# Patient Record
Sex: Female | Born: 1960 | Race: Black or African American | Hispanic: No | Marital: Single | Smoking: Never smoker
Health system: Southern US, Community
[De-identification: ages and names within clinical notes are randomized; demographics above are authoritative.]

## PROBLEM LIST (undated history)

## (undated) DIAGNOSIS — E119 Type 2 diabetes mellitus without complications: Secondary | ICD-10-CM

## (undated) DIAGNOSIS — G2581 Restless legs syndrome: Secondary | ICD-10-CM

## (undated) DIAGNOSIS — I2510 Atherosclerotic heart disease of native coronary artery without angina pectoris: Secondary | ICD-10-CM

## (undated) DIAGNOSIS — F329 Major depressive disorder, single episode, unspecified: Secondary | ICD-10-CM

## (undated) DIAGNOSIS — I639 Cerebral infarction, unspecified: Secondary | ICD-10-CM

## (undated) DIAGNOSIS — I10 Essential (primary) hypertension: Secondary | ICD-10-CM

## (undated) DIAGNOSIS — F419 Anxiety disorder, unspecified: Secondary | ICD-10-CM

## (undated) DIAGNOSIS — F32A Depression, unspecified: Secondary | ICD-10-CM

## (undated) DIAGNOSIS — K5909 Other constipation: Secondary | ICD-10-CM

## (undated) DIAGNOSIS — J45909 Unspecified asthma, uncomplicated: Secondary | ICD-10-CM

## (undated) HISTORY — PX: ABDOMINAL HYSTERECTOMY: SHX81

## (undated) HISTORY — PX: CAROTID ENDARTERECTOMY: SUR193

## (undated) HISTORY — DX: Unspecified asthma, uncomplicated: J45.909

## (undated) MED ORDER — DULOXETINE HCL 30 MG OR CPEP
ORAL_CAPSULE | ORAL | 0 refills | Status: AC
Start: 2018-10-24 — End: 2018-12-23

## (undated) MED ORDER — NICOTINE 21 MG/24HR TD PT24
MEDICATED_PATCH | TRANSDERMAL | 0 refills | Status: AC
Start: 2018-05-25 — End: ?

## (undated) MED ORDER — LISINOPRIL 40 MG OR TABS
ORAL_TABLET | ORAL | 0 refills | Status: AC
Start: 2020-03-19 — End: ?

## (undated) MED ORDER — QUETIAPINE FUMARATE 200 MG OR TABS
ORAL_TABLET | ORAL | 0 refills | Status: AC
Start: 2018-11-14 — End: ?

## (undated) MED ORDER — NITROGLYCERIN 0.4 MG SL SUBL
SUBLINGUAL_TABLET | SUBLINGUAL | Status: AC
Start: 2018-07-27 — End: ?

## (undated) MED ORDER — QUETIAPINE FUMARATE 200 MG OR TABS
ORAL_TABLET | ORAL | 0 refills | Status: AC
Start: 2018-11-21 — End: ?

## (undated) MED ORDER — POTASSIUM CHLORIDE CR 10 MEQ OR TBCR
10.00 meq | EXTENDED_RELEASE_TABLET | Freq: Every day | ORAL | 3 refills | Status: AC
Start: 2018-09-18 — End: ?

---

## 1991-10-05 HISTORY — PX: TOTAL ABDOMINAL HYSTERECTOMY W/ BILATERAL SALPINGOOPHORECTOMY: SHX83

## 1997-02-21 ENCOUNTER — Emergency Department: Admission: EM | Admit: 1997-02-21 | Payer: Self-pay

## 1997-05-15 ENCOUNTER — Emergency Department: Admission: EM | Admit: 1997-05-15 | Payer: Self-pay

## 1997-05-16 ENCOUNTER — Emergency Department: Admission: EM | Admit: 1997-05-16 | Payer: Self-pay

## 1997-05-17 ENCOUNTER — Inpatient Hospital Stay: Admission: TF | Admit: 1997-05-17 | Payer: Self-pay

## 2001-10-04 DIAGNOSIS — G2581 Restless legs syndrome: Secondary | ICD-10-CM

## 2001-10-04 DIAGNOSIS — F319 Bipolar disorder, unspecified: Secondary | ICD-10-CM

## 2001-10-04 DIAGNOSIS — F209 Schizophrenia, unspecified: Secondary | ICD-10-CM

## 2001-10-04 DIAGNOSIS — F251 Schizoaffective disorder, depressive type: Secondary | ICD-10-CM | POA: Insufficient documentation

## 2001-10-04 DIAGNOSIS — F32A Depression, unspecified: Secondary | ICD-10-CM

## 2001-10-04 DIAGNOSIS — F419 Anxiety disorder, unspecified: Secondary | ICD-10-CM

## 2001-10-04 HISTORY — DX: Depression, unspecified: F32.A

## 2001-10-04 HISTORY — DX: Restless legs syndrome: G25.81

## 2001-10-04 HISTORY — DX: Anxiety disorder, unspecified: F41.9

## 2001-10-04 HISTORY — DX: Bipolar disorder, unspecified (CMS-HCC): F31.9

## 2001-10-04 HISTORY — DX: Schizophrenia, unspecified (CMS-HCC): F20.9

## 2016-11-20 DIAGNOSIS — Z8673 Personal history of transient ischemic attack (TIA), and cerebral infarction without residual deficits: Secondary | ICD-10-CM | POA: Insufficient documentation

## 2016-11-20 DIAGNOSIS — I639 Cerebral infarction, unspecified: Secondary | ICD-10-CM

## 2016-11-20 HISTORY — DX: Cerebral infarction, unspecified (CMS-HCC): I63.9

## 2016-12-02 HISTORY — PX: THROMBOENDARTERECTOMY: SHX46

## 2017-02-08 ENCOUNTER — Emergency Department (HOSPITAL_COMMUNITY): Payer: Self-pay

## 2017-02-08 ENCOUNTER — Emergency Department (HOSPITAL_COMMUNITY)
Admission: EM | Admit: 2017-02-08 | Discharge: 2017-02-08 | Disposition: A | Discharge status: Home / Self Care | Payer: Self-pay | Attending: Emergency Medicine | Admitting: Emergency Medicine

## 2017-02-08 ENCOUNTER — Encounter (HOSPITAL_COMMUNITY): Payer: Self-pay | Admitting: Emergency Medicine

## 2017-02-08 DIAGNOSIS — I2510 Atherosclerotic heart disease of native coronary artery without angina pectoris: Secondary | ICD-10-CM | POA: Insufficient documentation

## 2017-02-08 DIAGNOSIS — E119 Type 2 diabetes mellitus without complications: Secondary | ICD-10-CM | POA: Insufficient documentation

## 2017-02-08 DIAGNOSIS — R52 Pain, unspecified: Secondary | ICD-10-CM | POA: Insufficient documentation

## 2017-02-08 DIAGNOSIS — Z8673 Personal history of transient ischemic attack (TIA), and cerebral infarction without residual deficits: Secondary | ICD-10-CM | POA: Insufficient documentation

## 2017-02-08 DIAGNOSIS — Z7984 Long term (current) use of oral hypoglycemic drugs: Secondary | ICD-10-CM | POA: Insufficient documentation

## 2017-02-08 DIAGNOSIS — Z79899 Other long term (current) drug therapy: Secondary | ICD-10-CM | POA: Insufficient documentation

## 2017-02-08 DIAGNOSIS — I10 Essential (primary) hypertension: Secondary | ICD-10-CM | POA: Insufficient documentation

## 2017-02-08 DIAGNOSIS — B349 Viral infection, unspecified: Secondary | ICD-10-CM | POA: Insufficient documentation

## 2017-02-08 HISTORY — DX: Cerebral infarction, unspecified: I63.9

## 2017-02-08 HISTORY — DX: Other constipation: K59.09

## 2017-02-08 HISTORY — DX: Major depressive disorder, single episode, unspecified: F32.9

## 2017-02-08 HISTORY — DX: Atherosclerotic heart disease of native coronary artery without angina pectoris: I25.10

## 2017-02-08 HISTORY — DX: Restless legs syndrome: G25.81

## 2017-02-08 HISTORY — DX: Depression, unspecified: F32.A

## 2017-02-08 HISTORY — DX: Essential (primary) hypertension: I10

## 2017-02-08 HISTORY — DX: Anxiety disorder, unspecified: F41.9

## 2017-02-08 HISTORY — DX: Type 2 diabetes mellitus without complications: E11.9

## 2017-02-08 LAB — URINALYSIS, ROUTINE W REFLEX MICROSCOPIC
Bilirubin Urine: NEGATIVE
GLUCOSE, UA: NEGATIVE mg/dL
Hgb urine dipstick: NEGATIVE
KETONES UR: NEGATIVE mg/dL
LEUKOCYTES UA: NEGATIVE
NITRITE: NEGATIVE
PROTEIN: NEGATIVE mg/dL
Specific Gravity, Urine: 1.012 (ref 1.005–1.030)
pH: 5 (ref 5.0–8.0)

## 2017-02-08 LAB — CBC WITH DIFFERENTIAL/PLATELET
BASOS ABS: 0 10*3/uL (ref 0.0–0.1)
Basophils Relative: 0 %
EOS ABS: 0.2 10*3/uL (ref 0.0–0.7)
Eosinophils Relative: 1 %
HCT: 35.3 % — ABNORMAL LOW (ref 36.0–46.0)
Hemoglobin: 12 g/dL (ref 12.0–15.0)
LYMPHS ABS: 4.6 10*3/uL — AB (ref 0.7–4.0)
Lymphocytes Relative: 28 %
MCH: 30.4 pg (ref 26.0–34.0)
MCHC: 34 g/dL (ref 30.0–36.0)
MCV: 89.4 fL (ref 78.0–100.0)
MONO ABS: 1.5 10*3/uL — AB (ref 0.1–1.0)
Monocytes Relative: 9 %
Neutro Abs: 10.1 10*3/uL — ABNORMAL HIGH (ref 1.7–7.7)
Neutrophils Relative %: 62 %
PLATELETS: 227 10*3/uL (ref 150–400)
RBC: 3.95 MIL/uL (ref 3.87–5.11)
RDW: 14.6 % (ref 11.5–15.5)
WBC: 16.4 10*3/uL — AB (ref 4.0–10.5)

## 2017-02-08 LAB — COMPREHENSIVE METABOLIC PANEL
ALT: 28 U/L (ref 14–54)
AST: 32 U/L (ref 15–41)
Albumin: 4.1 g/dL (ref 3.5–5.0)
Alkaline Phosphatase: 48 U/L (ref 38–126)
Anion gap: 10 (ref 5–15)
BILIRUBIN TOTAL: 0.6 mg/dL (ref 0.3–1.2)
BUN: 11 mg/dL (ref 6–20)
CO2: 23 mmol/L (ref 22–32)
CREATININE: 0.85 mg/dL (ref 0.44–1.00)
Calcium: 9.4 mg/dL (ref 8.9–10.3)
Chloride: 105 mmol/L (ref 101–111)
GFR calc Af Amer: >60 mL/min (ref >60)
Glucose, Bld: 95 mg/dL (ref 65–99)
Potassium: 3.9 mmol/L (ref 3.5–5.1)
Sodium: 138 mmol/L (ref 135–145)
TOTAL PROTEIN: 7.5 g/dL (ref 6.5–8.1)

## 2017-02-08 LAB — I-STAT TROPONIN, ED
TROPONIN I, POC: 0 ng/mL (ref 0.00–0.08)
Troponin i, poc: 0.01 ng/mL (ref 0.00–0.08)

## 2017-02-08 LAB — I-STAT CG4 LACTIC ACID, ED: Lactic Acid, Venous: 1.6 mmol/L (ref 0.5–1.9)

## 2017-02-08 NOTE — ED Triage Notes (Signed)
Pt c/o neck stiff-- has had recent surgery on right carotid in [place]--- recently moved here last week-- states that she is supposed to have surgery on left side- but no physician locally.  Pt c/o woke up not feeling good, arms hurt, aches all over- did receive flu shot--

## 2017-02-08 NOTE — ED Notes (Signed)
Phlebotomy unsuccessful

## 2017-02-08 NOTE — Discharge Instructions (Signed)
Take ibuprofen 600 mg and Tylenol 650 mg every 8 hours for 2 days

## 2017-02-08 NOTE — ED Notes (Signed)
Pt came to the desk and reported left sided chest pain that started while waiting.

## 2017-02-08 NOTE — ED Notes (Signed)
ED Provider at bedside. 

## 2017-02-08 NOTE — ED Provider Notes (Signed)
[facility]ENCY DEPT Provider Note   CSN: [id number] Arrival date & time: 02/08/17  1309     History   Chief Complaint Chief Complaint  Patient presents with  . Generalized Body Aches  . Fever    HPI Amy Cox is a 56 y.o. female.  The history is provided by the patient and a relative.  Fever   This is a new problem. Episode onset: today. The problem occurs constantly. The problem has been gradually improving. Her temperature was unmeasured prior to arrival. Associated symptoms include muscle aches (centered on back, shoulders, and neck.). Pertinent negatives include no chest pain, no diarrhea, no vomiting, no congestion, no headaches and no cough. She has tried nothing for the symptoms. The treatment provided no relief.    Past Medical History:  Diagnosis Date  . Anxiety   . Chronic constipation   . Coronary artery disease   . Depression   . Diabetes mellitus without complication (HCC)   . Hypertension   . Restless legs syndrome (RLS)   . Stroke [facility](HCC)     There are no active problems to display for this patient.   Past Surgical History:  Procedure Laterality Date  . ABDOMINAL HYSTERECTOMY    . CAROTID ENDARTERECTOMY Right     OB History    No data available       Home Medications    Prior to Admission medications   Medication Sig Start Date End Date Taking? Authorizing Provider  acetaminophen (TYLENOL) 325 MG tablet Take 650 mg by mouth every 6 (six) hours as needed for mild pain.   Yes [provider]  atorvastatin (LIPITOR) 80 MG tablet Take 80 mg by mouth at bedtime.   Yes [provider]  bisacodyl (DULCOLAX) 10 MG suppository Place 10 mg rectally as needed for moderate constipation.   Yes [provider]  bisacodyl (DULCOLAX) 5 MG EC tablet Take 10 mg by mouth every evening.   Yes [provider]  calcium carbonate (TUMS - DOSED IN MG ELEMENTAL CALCIUM) 500 MG chewable tablet Chew 1-2 tablets by mouth every 4 (four)  hours as needed for indigestion or heartburn.   Yes [provider]  clopidogrel (PLAVIX) 75 MG tablet Take 75 mg by mouth daily.   Yes [provider]  furosemide (LASIX) 20 MG tablet Take 20 mg by mouth every other day.   Yes [provider]  HYDROcodone-acetaminophen (NORCO/VICODIN) 5-325 MG tablet Take 1 tablet by mouth at bedtime as needed for moderate pain.   Yes [provider]  metFORMIN (GLUMETZA) 1000 MG (MOD) 24 hr tablet Take 1,000 mg by mouth daily with breakfast.   Yes [provider]  potassium chloride (K-DUR,KLOR-CON) 10 MEQ tablet Take 10 mEq by mouth every morning.   Yes [provider]  rOPINIRole (REQUIP) 3 MG tablet Take 3 mg by mouth at bedtime.   Yes [provider]  traZODone (DESYREL) 50 MG tablet Take 25 mg by mouth at bedtime.   Yes [provider]    Family History No family history on file.  Social History Social History  Substance Use Topics  . Smoking status: Never Smoker  . Smokeless tobacco: Never Used  . Alcohol use No     Allergies   Patient has no allergy information on record.   Review of Systems Review of Systems  Constitutional: Positive for fever.  HENT: Negative for congestion and trouble swallowing.   Eyes: Negative.   Respiratory: Negative for cough and shortness  of breath.   Cardiovascular: Negative for chest pain and leg swelling.  Gastrointestinal: Negative for diarrhea and vomiting.  Genitourinary: Negative.   Neurological: Negative for headaches.  All other systems reviewed and are negative.    Physical Exam Updated Vital Signs BP 122/71 (BP Location: Right Arm)   Pulse 94   Temp 100.2 F (37.9 C) (Oral)   Resp (!) 22   SpO2 97%   Physical Exam  Constitutional: She is oriented to person, place, and time. She appears well-developed and well-nourished. No distress.  HENT:  Head: Normocephalic and atraumatic.  Mouth/Throat: Oropharynx is clear and  moist.  Eyes: EOM are normal. Pupils are equal, round, and reactive to light.  Neck: Normal range of motion. Neck supple. No tracheal deviation present.  Well healed surgical scar along right neck. No swelling, induration, or erythema. No LAD  Cardiovascular: Normal rate, regular rhythm, normal heart sounds and intact distal pulses.   No murmur heard. Pulmonary/Chest: Effort normal and breath sounds normal. No respiratory distress.  Abdominal: Soft. She exhibits no distension. There is no tenderness. There is no guarding.  Musculoskeletal: Normal range of motion. She exhibits no edema or tenderness.  Neurological: She is alert and oriented to person, place, and time. No cranial nerve deficit. She exhibits normal muscle tone. Coordination normal.  Baseline decreased strength in left upper ext  Skin: Skin is warm and dry. She is not diaphoretic. No erythema.  Psychiatric: She has a normal mood and affect.  Nursing note and vitals reviewed.    ED Treatments / Results  Labs (all labs ordered are listed, but only abnormal results are displayed) Labs Reviewed  CBC WITH DIFFERENTIAL/PLATELET - Abnormal; Notable for the following:       Result Value   WBC 16.4 (*)    HCT 35.3 (*)    Neutro Abs 10.1 (*)    Lymphs Abs 4.6 (*)    Monocytes Absolute 1.5 (*)    All other components within normal limits  COMPREHENSIVE METABOLIC PANEL  URINALYSIS, ROUTINE W REFLEX MICROSCOPIC  I-STAT CG4 LACTIC ACID, ED  Rosezena Sensor, ED  Rosezena Sensor, ED    EKG  EKG Interpretation  Date/Time:  Tuesday Feb 08 2017 16:49:06 EDT Ventricular Rate:  79 PR Interval:  120 QRS Duration: 80 QT Interval:  389 QTC Calculation: 446 R Axis:   36 Text Interpretation:  Sinus rhythm Nonspecific T abnormalities, anterior leads No significant change since last tracing Confirmed by KNAPP  MD-J, JON ([id number]) on 02/08/2017 5:00:37 PM       Radiology Dg Chest 2 View  Result Date: 02/08/2017 CLINICAL DATA:  Neck  stiffness.  Recent carotid surgery. EXAM: CHEST  2 VIEW COMPARISON:  None. FINDINGS: Cardiomegaly. No focal infiltrates or significant atelectasis. Low lung volumes accentuate the pulmonary markings, and there could be mild vascular congestion without frank edema. No effusion or pneumothorax. Degenerative change thoracic spine. IMPRESSION: Cardiomegaly. Query mild vascular congestion without frank edema. No active disease. Electronically Signed   By: Elsie Stain M.D.   On: 02/08/2017 15:32    Procedures Procedures (including critical care time)  Medications Ordered in ED Medications - No data to display   Initial Impression / Assessment and Plan / ED Course  I have reviewed the triage vital signs and the nursing notes.  Pertinent labs & imaging results that were available during my care of the patient were reviewed by me and considered in my medical decision making (see chart for details).  Patient is a 55 year old female with a recent history of stroke in February with resultant left sided defecits s/p right-sided endarterectomy in April who presents with body aches as well as chills and fever that began today. No fever at home but febrile here. Otherwise hemodynamically stable. She has not had a left endarterectomy yet which is supposed to happen in the near future but she recently moved here from [place] and has not set up health care providers. She is otherwise well appearing without signs of a surgical site infection and has neck full range of motion and is awake alert oriented. While she was here she also complained of some vague left-sided chest pain. EKG with nonspecific T-wave changes but otherwise no acute ischemic changes and unchanged from multiple EKGs here. Delta troponin negative. At this time doubt ACS. No new neuro findings concerning for stroke. Concern for viral syndrome. Not septic at this time.   I have reviewed all labs and iamging. Patient stable for discharge home.  I  have reviewed all results with the patient. Advised to f/u with a pcp within 5 days for further evaluation and for coordination of future procedures and care.. Patient agrees to stated plan. All questions answered. Advised to call or return to have any questions, new symptoms, change in symptoms, or symptoms that they do not understand.   Final Clinical Impressions(s) / ED Diagnoses   Final diagnoses:  Viral syndrome  Body aches    New Prescriptions Discharge Medication List as of 02/08/2017  8:41 PM       Marijean Niemann, MD 02/09/17 1610    Linwood Dibbles, MD 02/09/17 1625

## 2017-02-08 NOTE — ED Notes (Signed)
Phlebotomy at the bedside  

## 2017-07-01 ENCOUNTER — Encounter: Payer: Self-pay | Admitting: Student in an Organized Health Care Education/Training Program

## 2017-07-01 ENCOUNTER — Ambulatory Visit
Payer: No Typology Code available for payment source | Attending: Family Practice | Admitting: Student in an Organized Health Care Education/Training Program

## 2017-07-01 VITALS — BP 131/84 | HR 70 | Temp 98.5°F | Resp 17 | Ht 62.0 in | Wt 223.7 lb

## 2017-07-01 DIAGNOSIS — I6523 Occlusion and stenosis of bilateral carotid arteries: Secondary | ICD-10-CM | POA: Insufficient documentation

## 2017-07-01 DIAGNOSIS — Z0000 Encounter for general adult medical examination without abnormal findings: Secondary | ICD-10-CM | POA: Insufficient documentation

## 2017-07-01 DIAGNOSIS — F319 Bipolar disorder, unspecified: Secondary | ICD-10-CM | POA: Insufficient documentation

## 2017-07-01 DIAGNOSIS — R0789 Other chest pain: Secondary | ICD-10-CM | POA: Insufficient documentation

## 2017-07-01 DIAGNOSIS — I63233 Cerebral infarction due to unspecified occlusion or stenosis of bilateral carotid arteries: Secondary | ICD-10-CM | POA: Insufficient documentation

## 2017-07-01 DIAGNOSIS — F99 Mental disorder, not otherwise specified: Secondary | ICD-10-CM | POA: Insufficient documentation

## 2017-07-01 DIAGNOSIS — J449 Chronic obstructive pulmonary disease, unspecified: Secondary | ICD-10-CM | POA: Insufficient documentation

## 2017-07-01 DIAGNOSIS — Z1211 Encounter for screening for malignant neoplasm of colon: Secondary | ICD-10-CM | POA: Insufficient documentation

## 2017-07-01 DIAGNOSIS — F209 Schizophrenia, unspecified: Secondary | ICD-10-CM | POA: Insufficient documentation

## 2017-07-01 DIAGNOSIS — F17200 Nicotine dependence, unspecified, uncomplicated: Secondary | ICD-10-CM | POA: Insufficient documentation

## 2017-07-01 DIAGNOSIS — F5105 Insomnia due to other mental disorder: Secondary | ICD-10-CM | POA: Insufficient documentation

## 2017-07-01 DIAGNOSIS — G2581 Restless legs syndrome: Secondary | ICD-10-CM | POA: Insufficient documentation

## 2017-07-01 MED ORDER — CLOPIDOGREL BISULFATE 75 MG OR TABS
75.00 mg | ORAL_TABLET | Freq: Every day | ORAL | 0 refills | Status: DC
Start: 2017-06-13 — End: 2017-07-01

## 2017-07-01 MED ORDER — FUROSEMIDE 20 MG OR TABS
20.0000 mg | ORAL_TABLET | Freq: Every day | ORAL | 3 refills | Status: DC
Start: 2017-07-01 — End: 2018-03-07

## 2017-07-01 MED ORDER — ASPIRIN 81 MG OR TBEC
81.0000 mg | DELAYED_RELEASE_TABLET | Freq: Every day | ORAL | 3 refills | Status: DC
Start: 2017-07-01 — End: 2018-03-07

## 2017-07-01 MED ORDER — ASPIRIN 81 MG OR TBEC
81.00 mg | DELAYED_RELEASE_TABLET | Freq: Every day | ORAL | Status: DC
Start: ? — End: 2017-07-01

## 2017-07-01 MED ORDER — TRAZODONE HCL 100 MG OR TABS
100.0000 mg | ORAL_TABLET | Freq: Every evening | ORAL | 0 refills | Status: DC
Start: 2017-07-01 — End: 2017-08-04

## 2017-07-01 MED ORDER — FUROSEMIDE 20 MG OR TABS
20.00 mg | ORAL_TABLET | ORAL | 0 refills | Status: DC
Start: 2017-06-13 — End: 2017-07-01

## 2017-07-01 MED ORDER — ATORVASTATIN CALCIUM 80 MG OR TABS
80.0000 mg | ORAL_TABLET | Freq: Every evening | ORAL | 3 refills | Status: DC
Start: 2017-07-01 — End: 2018-03-07

## 2017-07-01 MED ORDER — TRAZODONE HCL 100 MG OR TABS
100.00 mg | ORAL_TABLET | Freq: Every evening | ORAL | 0 refills | Status: DC
Start: 2017-06-13 — End: 2017-07-01

## 2017-07-01 MED ORDER — ATORVASTATIN CALCIUM 80 MG OR TABS
80.00 mg | ORAL_TABLET | Freq: Every evening | ORAL | 0 refills | Status: DC
Start: 2017-06-13 — End: 2017-07-01

## 2017-07-01 MED ORDER — POTASSIUM CHLORIDE CR 10 MEQ OR TBCR
10.00 meq | EXTENDED_RELEASE_TABLET | Freq: Every day | ORAL | 0 refills | Status: DC
Start: 2017-06-13 — End: 2017-07-01

## 2017-07-01 MED ORDER — OLANZAPINE 7.5 MG OR TABS
7.50 mg | ORAL_TABLET | Freq: Every day | ORAL | 0 refills | Status: DC
Start: 2017-06-13 — End: 2017-07-01

## 2017-07-01 MED ORDER — OLANZAPINE 7.5 MG OR TABS
7.5000 mg | ORAL_TABLET | Freq: Every day | ORAL | 3 refills | Status: DC
Start: 2017-07-01 — End: 2017-08-04

## 2017-07-01 MED ORDER — ROPINIROLE HCL 4 MG OR TABS
ORAL_TABLET | ORAL | 0 refills | Status: DC
Start: 2017-06-14 — End: 2017-07-01

## 2017-07-01 MED ORDER — ROPINIROLE HCL 4 MG OR TABS
4.0000 mg | ORAL_TABLET | Freq: Every day | ORAL | 3 refills | Status: DC
Start: 2017-07-01 — End: 2017-08-04

## 2017-07-01 MED ORDER — POTASSIUM CHLORIDE CR 10 MEQ OR TBCR
10.0000 meq | EXTENDED_RELEASE_TABLET | Freq: Every day | ORAL | 3 refills | Status: DC
Start: 2017-07-01 — End: 2018-03-07

## 2017-07-01 MED ORDER — CLOPIDOGREL BISULFATE 75 MG OR TABS
75.0000 mg | ORAL_TABLET | Freq: Every day | ORAL | 3 refills | Status: DC
Start: 2017-07-01 — End: 2018-03-07

## 2017-07-01 NOTE — Patient Instructions (Signed)
It was nice to see you in my clinic today. Thank you for allowing me to take part in your care. The following are some of the things we discussed today:    - You were given flu shot today  - Have your blood tests done before next visit.  - Pick up your medication from the pharmacy.  - Take your medication as directed by the instruction.   - You were referred to Physical Therapy, Pulmonary Function Test. If you do not hear about your referral within two weeks, please call [phone].   - Come back to clinic to see me in 4 weeks    If I may be of any further assistance, please do not hesitate to contact me at [phone]. And if you have not done so already, please contact the front desk on your way out for information on secure health messaging to communicate with me electronically.     Take Care,    Ailene Ravel, MD, PGY-3  [facility] - [facility]

## 2017-07-01 NOTE — Progress Notes (Addendum)
Ambulatory History and Physical Examination    July 01, 2017    Language: [place]     CC:   Chief Complaint   Patient presents with   . Other     needs referrals   . Other     consult to resestablish         HPI: Regina Ward is a 55 year old female who has a past medical history of mental illness (PTSD, bipolar, schizophrenia, anxiety), COPD, bilateral carotid stenosis s/p right thromboenderectomy, CVA with left sided neuro-deficits and chronic insomnia who presents today for establishing care.     - Recently relocated back to [facility]  - Received most of her prior care in [place]    - Feels that the psych medication is not working.  - Recently started seeing a psychiatrist at adult mental health clinic at [facility] (has appt in November), need med refills to last until then.    - complains of insomnia for over 10 years. Sleeps an average 2 hours per night and takes a 2-hour nap during the day. Doesn't watch TV. Drinks decaf coffee.     - Reports pressure-like pain in left breast that started a week ago. Rates it as 7/10. Is reproducible. Comes on when lying down at night. Lasts about 5 minutes. Goes away after massaging it.     - Denies SOB  - Reports occasional palpitations  - Reports having headache in her entire head for the last 2 weeks. Happens when lying down at nighttime. Reports phonophobia but no photophobia.   - Reports weakness in left arm, numbness, and tingling in both hands.   - Reports heat-intolerance, increased appetite, constipation  - Reports left lateral knee pain with walking.   - Denies sore throat, rhinorrhea, abdominal pain, rashes,   - Denies fevers, chills, night sweats        PAST MEDICAL HISTORY:  Patient Active Problem List   Diagnosis   . Insomnia due to other mental disorder   . Bilateral carotid artery stenosis   . Schizophrenia (CMS-HCC)   . Stroke (cerebrum) (CMS-HCC)   . COPD with asthma (CMS-HCC)   . Depression   . Bipolar 1 disorder (CMS-HCC)   . Restless  leg syndrome   . Posttraumatic stress disorder       SURGICAL HISTORY:   Past Surgical History:   Procedure Laterality Date   . THROMBOENDARTERECTOMY Right 04/2017    Carotd endarterectomy   . TOTAL ABDOMINAL HYSTERECTOMY W/ BILATERAL SALPINGOOPHORECTOMY  1993       MEDICATIONS:  Outpatient Prescriptions Marked as Taking for the 07/01/17 encounter (Office Visit) with Wende Crease, MD   Medication Sig Dispense Refill   . aspirin 81 MG EC tablet Take 1 tablet (81 mg) by mouth daily. 60 tablet 3   . atorvastatin (LIPITOR) 80 MG tablet Take 1 tablet (80 mg) by mouth nightly. 60 tablet 3   . clopidogrel (PLAVIX) 75 MG tablet Take 1 tablet (75 mg) by mouth daily. 60 tablet 3   . furosemide (LASIX) 20 MG tablet Take 1 tablet (20 mg) by mouth daily. 60 tablet 3   . OLANZapine (ZYPREXA) 7.5 MG tablet Take 1 tablet (7.5 mg) by mouth daily. 30 tablet 3   . potassium chloride (K-DUR) 10 MEQ Sustained-Release tablet Take 1 tablet (10 mEq) by mouth daily. 60 tablet 3   . ropinirole (REQUIP) 4 MG tablet Take 1 tablet (4 mg) by mouth daily. 60 tablet 3   .  traZODone (DESYREL) 100 MG tablet Take 1 tablet (100 mg) by mouth nightly. 30 tablet 0       ALLERGIES:  No Known Allergies    FAMILY HISTORY:  Denies any heart disease in family  Denies any cancers in family    Social History     Social History   . Marital status: Single     Spouse name: N/A   . Number of children: N/A   . Years of education: N/A     Social History Main Topics   . Smoking status: Current Every Day Smoker     Packs/day: 1.50     Years: 41.00     Types: Cigarettes   . Smokeless tobacco: Never Used   . Alcohol use No   . Drug use: No      Comment: ex drug use 2016 - cocaine   . Sexual activity: No      Comment: Not sexually active in 3 years     Social Activities of Daily Living Present   . None     Social History Narrative       HEALTH MAINTENANCE:  Most Recent Immunizations   Administered Date(s) Administered   . Influenza Vaccine >=3 Years 07/01/2017       REVIEW  OF SYSTEMS:  Denied fever, chills, significant weight change  Denied headache, vision changes  Denied any palpitations  Denied any shortness of breath, cough  Denied any nausea, vomiting, abdominal discomfort, changes in bowel habits  Denied any dysuria, polyuria    VITALS:  BP 131/84 (BP Location: Right arm, BP Patient Position: Sitting, BP cuff size: Regular)  Pulse 70  Temp 98.5 F (36.9 C)  Resp 17  Ht  (1.575 m)  Wt 101.5 kg (223 lb 10.5 oz)  SpO2 98%  BMI 40.91 kg/m2  Body mass index is 40.91 kg/(m^2).    Gen: WDWN. A & O x 4. Appears stated age. Is pleasant and cooperative.   CV: S1 and S2 noted. No S3, S4, murmurs or gallops noted. No carotid bruits.  Resp: CTAB. No wheezing or crackles noted.  Abdomen: Soft. Non-distended. BS x 4. Percussion tympanic in all 4 quadrants. Not TTP throughout.    Neuro: CN II-XII intact. MS 4/5 in left UE and LE. Delayed motor response noted in the left hand.   Psych: Flat affect. Thought process linear and logical.      LABS AND IMAGING:  None in chart      ASSESSMENT/PLAN:    1. Other chest pain  Atypical, likely c/b morbid obesity, physical deconditioning, MSK chest pain. Reproducible, likely costochondritis. Normal EKG makes acute MI unlikely. Does not appear fluid overload, however will assess pulmonary etiology.   - Obtain prior records (release signed)   - Routine labs (CBC, CMP, Lipids, A1c, TSH)  - BNP, CXR  - Cont ASA  - Take nitroglycerin when having symptoms.   - May consider Lexiscan and Echocardiogram on next visit    2. Insomnia due to other mental disorder  Likely component of multiple mental illness including bipolar, schizophrenia, anxiety and depression. Insomnia likely will improve with more effective management of underlying mental disorder. History inconsistent of maniac episode.   - Cont mgmt per outside psychiatrist   - Cont current med regimen (see below)  - Educated on sleep hygiene and provided handout   - traZODone (DESYREL) 100 MG  tablet; Take 1 tablet (100 mg) by mouth nightly.  Dispense: 30 tablet; Refill: 0  3. Schizophrenia, unspecified type (CMS-HCC)  4. Bipolar 1 disorder (CMS-HCC)  Not well controlled per patient. Pt to establish care with outside psychiatrist.   - Cont mgmt per outside psychiatrist, will obtain records once established   - Cont current med regimen (see below)  - OLANZapine (ZYPREXA) 7.5 MG tablet; Take 1 tablet (7.5 mg) by mouth daily.  Dispense: 30 tablet; Refill: 3    5. Bilateral carotid artery stenosis  Per patient s/p R carotid thromboendarterectomy but still need L side. Asymptomatic.   - Obtain records from prior vascular surgeon (in [place])  - Will refer to vascular surgery for eval of L carotid stenosis after obtaining records  - atorvastatin (LIPITOR) 80 MG tablet; Take 1 tablet (80 mg) by mouth nightly.  Dispense: 60 tablet; Refill: 3  - clopidogrel (PLAVIX) 75 MG tablet; Take 1 tablet (75 mg) by mouth daily.  Dispense: 60 tablet; Refill: 3    6. Cerebrovascular accident (CVA) due to bilateral stenosis of carotid arteries (CMS-HCC)  Likely 2/2 carotid stenosis vs vascular pathology in setting of long standing smoking history and morbid obesity. With residual Left sided weakness and neurological deficits. Will benefit from PT.   - Obtain prior records  - aspirin 81 MG EC tablet; Take 1 tablet (81 mg) by mouth daily.  Dispense: 60 tablet; Refill: 3  - atorvastatin (LIPITOR) 80 MG tablet; Take 1 tablet (80 mg) by mouth nightly.  Dispense: 60 tablet; Refill: 3  - Consult to Physical Therapy - External    7. COPD with asthma (CMS-HCC)  8. Tobacco use disorder  Hx of 61 pack/year smoking. Pulse Ox 98% on RA.   - Obtain prior records  - Pulmonary Function Test - Pulmonary Lab; Future  - Discussed tobacco cessation, pt pre-contemplative/contemplative, will discuss in detail on next visit  - Consider low-dose CT for lung cancer screening    9. Restless leg syndrome  - ropinirole (REQUIP) 4 MG tablet; Take 1  tablet (4 mg) by mouth daily.  Dispense: 60 tablet; Refill: 3    10. Routine general medical examination at a health care facility  - Stool Immunochemical Occult Blood; Future  - Flu Vaccine, IM >=3 Years  - CBC w/ Diff Lavender; Future  - Comprehensive Metabolic Panel - See Instructions; Future  - Glycosylated Hgb(A1C), Blood Lavender; Future  - Lipid Panel Green Plasma Separator Tube; Future  - Vitamin D, 25-Hydroxy, Blood Yellow serum separator tube; Future  - TSH, Blood Green Plasma Separator Tube; Future  - Defer discussion of WWE to next 1-2 visits    RTC Return in about 4 weeks (around 07/29/2017) for f/u with Dr. Juel Burrow.    Future Appointments  Date Time Provider Department Center   08/04/2017 10:40 AM Wende Crease, MD [facility]        Patient was given strict return precautions should symptoms worsen or fail to improve for any concerns.    Orders Placed This Encounter   Procedures   . Flu Vaccine, IM >=3 Years   . Stool Immunochemical Occult Blood   . CBC w/ Diff Lavender   . Comprehensive Metabolic Panel - See Instructions   . Glycosylated Hgb(A1C), Blood Lavender   . Lipid Panel Green Plasma Separator Tube   . Vitamin D, 25-Hydroxy, Blood Yellow serum separator tube   . TSH, Blood Green Plasma Separator Tube   . Consult to Physical Therapy - External   . ECG, In Clinic   . ECG 12-LEAD  Attending attestation:  I reviewed the key and critical portions of the history and physical as presented by the resident/fellow and agree with the medical decision making and the assessment and plan as documented. My additions or revision are included in the record.  56 yo F with MMP here to establish care. C/o insomnia and chest pain. EXAM VSS. CV RRR; EKG no acute findings  A/p  1. Atypical CP: get labs; consider stress test. ED precautions.   2. Bipolar; see psych  Alvino Blood, MD

## 2017-07-04 ENCOUNTER — Encounter: Payer: Self-pay | Admitting: Student in an Organized Health Care Education/Training Program

## 2017-07-04 DIAGNOSIS — J449 Chronic obstructive pulmonary disease, unspecified: Secondary | ICD-10-CM

## 2017-07-04 DIAGNOSIS — F4310 Post-traumatic stress disorder, unspecified: Secondary | ICD-10-CM | POA: Insufficient documentation

## 2017-07-04 DIAGNOSIS — F99 Mental disorder, not otherwise specified: Secondary | ICD-10-CM | POA: Insufficient documentation

## 2017-07-04 DIAGNOSIS — I6523 Occlusion and stenosis of bilateral carotid arteries: Secondary | ICD-10-CM | POA: Insufficient documentation

## 2017-07-04 DIAGNOSIS — F209 Schizophrenia, unspecified: Secondary | ICD-10-CM | POA: Insufficient documentation

## 2017-07-04 HISTORY — DX: Chronic obstructive pulmonary disease, unspecified (CMS-HCC): J44.9

## 2017-07-04 MED ORDER — NITROGLYCERIN 0.4 MG SL SUBL
0.4000 mg | SUBLINGUAL_TABLET | SUBLINGUAL | 1 refills | Status: DC | PRN
Start: 2017-07-04 — End: 2018-03-07

## 2017-07-05 ENCOUNTER — Telehealth: Payer: Self-pay | Admitting: Student in an Organized Health Care Education/Training Program

## 2017-07-05 NOTE — Telephone Encounter (Signed)
The patient is requesting a call back regarding pickup tomorrow stool kit and sleep rehab sheet. Please confirm if you can have those 2 items ready. Thank you.

## 2017-07-06 ENCOUNTER — Telehealth: Payer: Self-pay | Admitting: Student in an Organized Health Care Education/Training Program

## 2017-07-06 ENCOUNTER — Other Ambulatory Visit
Admission: RE | Admit: 2017-07-06 | Discharge: 2017-07-06 | Disposition: A | Discharge status: Home / Self Care | Payer: No Typology Code available for payment source | Attending: Family Practice | Admitting: Family Practice

## 2017-07-06 DIAGNOSIS — Z0000 Encounter for general adult medical examination without abnormal findings: Principal | ICD-10-CM | POA: Insufficient documentation

## 2017-07-06 DIAGNOSIS — R0789 Other chest pain: Secondary | ICD-10-CM | POA: Insufficient documentation

## 2017-07-06 LAB — CBC WITH DIFF, BLOOD
ANC automated: 5.3 10*3/uL (ref 2.0–8.1)
Basophils %: 0.4 %
Basophils Absolute: 0 10*3/uL (ref 0.0–0.2)
Eosinophils %: 2.1 %
Eosinophils Absolute: 0.2 10*3/uL (ref 0.0–0.5)
Hematocrit: 34.8 % (ref 34.0–44.0)
Hgb: 11.5 G/DL (ref 11.5–15.0)
Lymphocytes %: 37 %
Lymphocytes Absolute: 3.6 10*3/uL — ABNORMAL HIGH (ref 0.9–3.3)
MCH: 28.8 PG (ref 27.0–33.5)
MCHC: 32.9 G/DL (ref 32.0–35.5)
MCV: 87.4 FL (ref 81.5–97.0)
MPV: 9.5 FL (ref 7.2–11.7)
Monocytes %: 5.9 %
Monocytes Absolute: 0.6 10*3/uL (ref 0.0–0.8)
Neutrophils % (A): 54.6 %
PLT Count: 254 10*3/uL (ref 150–400)
RBC: 3.99 10*6/uL (ref 3.70–5.00)
RDW-CV: 15.7 % — ABNORMAL HIGH (ref 11.6–14.4)
White Bld Cell Count: 9.7 10*3/uL (ref 4.0–10.5)

## 2017-07-06 LAB — COMPREHENSIVE METABOLIC PANEL, BLOOD
ALT: 26 U/L (ref 7–52)
AST: 25 U/L (ref 13–39)
Albumin: 3.8 G/DL (ref 3.7–5.3)
Alk Phos: 69 U/L (ref 34–104)
BUN: 12 mg/dL (ref 7–25)
Bilirubin, Total: 0.3 mg/dL (ref 0.0–1.4)
CO2: 22 mmol/L (ref 21–31)
Calcium: 9.1 mg/dL (ref 8.6–10.3)
Chloride: 112 mmol/L — ABNORMAL HIGH (ref 98–107)
Creat: 0.9 mg/dL (ref 0.6–1.2)
Electrolyte Balance: 6 mmol/L (ref 2–12)
Glucose: 123 mg/dL (ref 85–125)
Potassium: 4.3 mmol/L (ref 3.5–5.1)
Protein, Total: 6.9 G/DL (ref 6.0–8.3)
Sodium: 140 mmol/L (ref 136–145)
eGFR - high estimate: >60 (ref >59)
eGFR - low estimate: >60 (ref >59)

## 2017-07-06 LAB — GLYCOSYLATED HGB(A1C), BLOOD: Glycated Hgb, A1C: 6.6 % — ABNORMAL HIGH (ref 4.6–5.6)

## 2017-07-06 LAB — BNP, BLOOD: BNP: 101 pg/mL — ABNORMAL HIGH (ref 0–100)

## 2017-07-06 LAB — LIPID(CHOL FRACT) PANEL, BLOOD
Cholesterol: 111 MG/DL (ref <200)
HDL Cholesterol: 42 MG/DL (ref >40)
LDL Cholesterol (calc): 54 MG/DL (ref <160)
Non HDL Cholesterol (calculated): 69 MG/DL (ref <130)
Triglycerides: 76 MG/DL (ref <150)
VLDL Cholesterol (calculated): 15 MG/DL

## 2017-07-06 LAB — VITAMIN D, 25-OH TOTAL: Vitamin D, 25-Hydroxy, Total: 29.7 ng/mL — ABNORMAL LOW (ref >30.0)

## 2017-07-06 LAB — TSH, BLOOD: TSH, Ultrasensitive: 1.707 u[IU]/mL (ref 0.450–4.120)

## 2017-07-06 NOTE — Telephone Encounter (Signed)
Dr. Monica Martinez not able to find any information for insomnia for pt Regina Ward can you please help me I will mail it to her, thank you

## 2017-07-06 NOTE — Telephone Encounter (Signed)
Toby from [employer] called. States they received the medical release form from [facility]. But they ae missing the dates of service needed. Please call or refax request with dates needed to fax # [phone]. Thank you

## 2017-07-06 NOTE — Telephone Encounter (Signed)
I found it Dr. Juel Burrow thank you for telling where to look for it I will call the pt to let her know thank you.

## 2017-07-06 NOTE — Telephone Encounter (Signed)
Patient states doctor forgot to give her a tip sheet for insomnia.  Please email.  Thank you.

## 2017-07-06 NOTE — Telephone Encounter (Signed)
Msg sent back to MA [facility].     "Hi Byrd Hesselbach,     I will be in clinic tomorrow Thursday 10/4. Please look for me to give you that handout.   If we are unable to connect, you can always find the sleep hygiene patient information in integrative medicine boxes in the intake room next to the MA room (near workstation 3). And if you still cannot locate it, you can ask [facility] to locate the handout as well.     Thanks,     [employer]ist) "

## 2017-07-06 NOTE — Telephone Encounter (Signed)
Pt will pick up information tomorrow [phone], envelope placed at front desk for pt

## 2017-07-08 ENCOUNTER — Telehealth: Payer: Self-pay

## 2017-07-08 NOTE — Telephone Encounter (Signed)
    Please call pt regarding the following.Regina KitchenMarland Ward     1) BNP lab test was added, please ask her to have it done   2) Chest XR ordered, please give her directions to have it done   3) Please let pt know that nitroglycerin has been prescribed and sent to her pharmacy. She can take that whenever she experiences chest pain that won't go away     Thanks, [employer] pt today regarding lab and x-ray order pt did not answer lvm to call me back for instructions on x-ray and blood test

## 2017-07-08 NOTE — Progress Notes (Signed)
See addendum at bottom of resident note for attestation and attending comments. Jazzalyn Loewenstein, MD

## 2017-07-08 NOTE — Telephone Encounter (Signed)
Patients care giver  is returning message for Byrd Hesselbach and she would like a call back and she wants to speak with the doctor . Please assist. Thank you.

## 2017-07-08 NOTE — Telephone Encounter (Addendum)
Spoke to Roby he will be faxing release of records back to [place] so we can add what records we need.

## 2017-07-09 ENCOUNTER — Telehealth: Payer: Self-pay | Admitting: Student in an Organized Health Care Education/Training Program

## 2017-07-09 NOTE — Telephone Encounter (Signed)
Msg routed to RN pool...    "Please call patient re: lab results  - Your kidneys, liver and thyroid are working normally  - You have diabetes, your A1c is at 6.6. I recommend trying to bring your blood sugars down by diet and exercise for now before we think about starting any medication. Please make sure you eat a healthy balanced diet full of colorful vegetables. I recommend cutting down on sugary drinks like juice and soda. Also to avoid eating too much carbohydrates or starch. Exercise each day 3-5 times a week is also important to keep you healthy and reduce your blood sugars.  - Your cholesterol numbers look great! Continue the good work.   - Your Vitamin D level is on the low site. I would recommend taking a multi-vitamin with vitamin D in it if you are not already doing so. Or, you can take a over-the-counter vitamin D supplement (I recommend vitamin D3 1000 IU daily). "

## 2017-07-11 ENCOUNTER — Other Ambulatory Visit
Admission: RE | Admit: 2017-07-11 | Discharge: 2017-07-11 | Disposition: A | Discharge status: Home / Self Care | Payer: No Typology Code available for payment source | Source: Ambulatory Visit | Attending: Student in an Organized Health Care Education/Training Program | Admitting: Student in an Organized Health Care Education/Training Program

## 2017-07-11 DIAGNOSIS — Z0000 Encounter for general adult medical examination without abnormal findings: Principal | ICD-10-CM | POA: Insufficient documentation

## 2017-07-11 NOTE — Telephone Encounter (Signed)
Placed call to pt, no answer, left voice message with call back number. MQuirozCeja RN

## 2017-07-11 NOTE — Telephone Encounter (Signed)
Placed call to pt, and spoke to pt's caregiver: Synetta Fail and relayed to her Dr. Truman Hayward Message:     Wende Crease, MD  [facility] Sa/Ana Rn Pool 2 days ago               Please call patient re: lab results...   - Your kidneys, liver and thyroid are working normally   - You have diabetes, your A1c is at 6.6. I recommend trying to bring your blood sugars down by diet and exercise for now before we think about starting any medication. Please make sure you eat a healthy balanced diet full of colorful vegetables. I recommend cutting down on sugary drinks like juice and soda. Also to avoid eating too much carbohydrates or starch. Exercise each day 3-5 times a week is also important to keep you healthy and reduce your blood sugars.   - Your cholesterol numbers look great! Continue the good work.   - Your Vitamin D level is on the low site. I would recommend taking a multi-vitamin with vitamin D in it if you are not already doing so. Or, you can take a over-the-counter vitamin D supplement (I recommend vitamin D3 1000 IU daily).          Caregiver verbalized understanding. Provided Synetta Fail with [facility] # so she can call and make an appt to get the CXR done. No other questions or concerns. MQuirozCeja RN

## 2017-07-11 NOTE — Telephone Encounter (Signed)
Spoke to [place] pts care giver states she spoke to [employer] and orders for lab and x-ray were clarified.

## 2017-07-13 ENCOUNTER — Telehealth: Payer: Self-pay | Admitting: Student in an Organized Health Care Education/Training Program

## 2017-07-13 NOTE — Telephone Encounter (Signed)
See previous telephone message  

## 2017-07-13 NOTE — Telephone Encounter (Signed)
Patients care giver states that some one from the office called her today and she is returning message. Please assist. Thank you.

## 2017-07-21 LAB — STOOL IMMUNOCHEMICAL OCCULT BLOOD: Occult Bld, (FIT): NEGATIVE

## 2017-07-27 ENCOUNTER — Telehealth: Payer: Self-pay

## 2017-07-27 NOTE — Telephone Encounter (Signed)
-----   Message from Wende CreaseLinda Lin, MD sent at 07/25/2017  1:43 PM PDT -----  Regarding: Obtain Outside Records  Hi, Please help in obtaining patient's outside records (release form signed and scanned in file). I will need these records before her f/u visit on 08/04/2017 with me. Once obtained, pls place in my inbox.    Thanks, State FarmLinda

## 2017-07-27 NOTE — Telephone Encounter (Signed)
Med Release form faxed, confirmation received.

## 2017-08-04 ENCOUNTER — Ambulatory Visit
Payer: No Typology Code available for payment source | Attending: Family Medicine | Admitting: Student in an Organized Health Care Education/Training Program

## 2017-08-04 ENCOUNTER — Encounter: Payer: Self-pay | Admitting: Student in an Organized Health Care Education/Training Program

## 2017-08-04 VITALS — BP 135/63 | HR 64 | Temp 98.5°F | Resp 14 | Ht 62.0 in | Wt 227.1 lb

## 2017-08-04 DIAGNOSIS — F99 Mental disorder, not otherwise specified: Secondary | ICD-10-CM | POA: Insufficient documentation

## 2017-08-04 DIAGNOSIS — F17200 Nicotine dependence, unspecified, uncomplicated: Secondary | ICD-10-CM | POA: Insufficient documentation

## 2017-08-04 DIAGNOSIS — F5105 Insomnia due to other mental disorder: Principal | ICD-10-CM | POA: Insufficient documentation

## 2017-08-04 DIAGNOSIS — E559 Vitamin D deficiency, unspecified: Secondary | ICD-10-CM | POA: Insufficient documentation

## 2017-08-04 DIAGNOSIS — G2581 Restless legs syndrome: Secondary | ICD-10-CM | POA: Insufficient documentation

## 2017-08-04 DIAGNOSIS — I63233 Cerebral infarction due to unspecified occlusion or stenosis of bilateral carotid arteries: Secondary | ICD-10-CM | POA: Insufficient documentation

## 2017-08-04 DIAGNOSIS — F203 Undifferentiated schizophrenia: Secondary | ICD-10-CM | POA: Insufficient documentation

## 2017-08-04 DIAGNOSIS — F319 Bipolar disorder, unspecified: Secondary | ICD-10-CM | POA: Insufficient documentation

## 2017-08-04 DIAGNOSIS — I6523 Occlusion and stenosis of bilateral carotid arteries: Secondary | ICD-10-CM | POA: Insufficient documentation

## 2017-08-04 DIAGNOSIS — E119 Type 2 diabetes mellitus without complications: Secondary | ICD-10-CM | POA: Insufficient documentation

## 2017-08-04 MED ORDER — OLANZAPINE 7.5 MG OR TABS
7.5000 mg | ORAL_TABLET | Freq: Every day | ORAL | 3 refills | Status: DC
Start: 2017-08-04 — End: 2017-09-04

## 2017-08-04 MED ORDER — ROPINIROLE HCL 4 MG OR TABS
4.0000 mg | ORAL_TABLET | Freq: Every day | ORAL | 3 refills | Status: DC
Start: 2017-08-04 — End: 2018-03-07

## 2017-08-04 MED ORDER — TRAZODONE HCL 100 MG OR TABS
100.0000 mg | ORAL_TABLET | Freq: Every evening | ORAL | 0 refills | Status: DC
Start: 2017-08-04 — End: 2017-09-03

## 2017-08-04 NOTE — Progress Notes (Addendum)
Ambulatory History and Physical Examination    August 04, 2017    Language: [place]     CC:   Chief Complaint   Patient presents with   . Follow Up         HPI:  Regina Ward is a 56 year old female who has a past medical history of mental illness (PTSD, bipolar, schizophrenia, anxiety), COPD, bilateral carotid stenosis s/p right thromboenderectomy, CVA with left sided neuro-deficits and chronic insomnia, and diabetes who presents today for follow-up. Patient's biggest problem is insomnia. She complains that she gets at most 2hrs of sleep per night, she says that she has thoughts that keep her awake especially about her kids. She says she wants haldol or seroquel, and that the trazodone isn't working. Does see a counselor, Baxter HireKristen, who has helped her set goals and work on her problems in her life of which she says there are many and stem from her childhood. Seeing a psychiatrist in a couple of weeks. As for her RLS, the ropinerole is not helping and her restless legs symptoms persists throughout most nights. She does say that stress aggravates her RLS.  She is trying to walk at least per mile per day. Patient would also like to know the status of the records that were sent to us from her surgeon in [place] who did her R carotid endarterectomy procedure.       PAST MEDICAL HISTORY:  Patient Active Problem List   Diagnosis   . Insomnia due to other mental disorder   . Bilateral carotid artery stenosis   . Schizophrenia (CMS-HCC)   . Stroke (cerebrum) (CMS-HCC)   . COPD with asthma (CMS-HCC)   . Depression   . Bipolar 1 disorder (CMS-HCC)   . Restless leg syndrome   . Posttraumatic stress disorder       SURGICAL HISTORY:   Past Surgical History:   Procedure Laterality Date   . THROMBOENDARTERECTOMY Right 04/2017    Carotd endarterectomy   . TOTAL ABDOMINAL HYSTERECTOMY W/ BILATERAL SALPINGOOPHORECTOMY  1993       MEDICATIONS:  Outpatient Prescriptions Marked as Taking for the 08/04/17 encounter (Office Visit) with Wende CreaseLin,  Linda, MD   Medication Sig Dispense Refill   . aspirin 81 MG EC tablet Take 1 tablet (81 mg) by mouth daily. 60 tablet 3   . atorvastatin (LIPITOR) 80 MG tablet Take 1 tablet (80 mg) by mouth nightly. 60 tablet 3   . clopidogrel (PLAVIX) 75 MG tablet Take 1 tablet (75 mg) by mouth daily. 60 tablet 3   . furosemide (LASIX) 20 MG tablet Take 1 tablet (20 mg) by mouth daily. 60 tablet 3   . nitroGLYcerin (NITROSTAT) 0.4 MG SL tablet 1 tablet (0.4 mg) by Sublingual route every 5 minutes as needed for Chest Pain. up to 3 tabs per episode. 1 bottle 1   . OLANZapine (ZYPREXA) 7.5 MG tablet Take 1 tablet (7.5 mg) by mouth daily. 30 tablet 3   . potassium chloride (K-DUR) 10 MEQ Sustained-Release tablet Take 1 tablet (10 mEq) by mouth daily. 60 tablet 3   . ropinirole (REQUIP) 4 MG tablet Take 1 tablet (4 mg) by mouth daily. 60 tablet 3   . traZODone (DESYREL) 100 MG tablet Take 1 tablet (100 mg) by mouth nightly. 30 tablet 0       ALLERGIES:  No Known Allergies    FAMILY HISTORY:  Denies any heart disease in family  Denies any cancers in family  Social History     Social History   . Marital status: Single     Spouse name: N/A   . Number of children: N/A   . Years of education: N/A     Social History Main Topics   . Smoking status: Current Every Day Smoker     Packs/day: 1.50     Years: 41.00     Types: Cigarettes   . Smokeless tobacco: Never Used   . Alcohol use No   . Drug use: No      Comment: ex drug use 2016 - cocaine   . Sexual activity: No      Comment: Not sexually active in 3 years     Social Activities of Daily Living Present   . None     Social History Narrative       HEALTH MAINTENANCE:  Most Recent Immunizations   Administered Date(s) Administered   . Influenza Vaccine >=3 Years 07/01/2017       REVIEW OF SYSTEMS:  Denied fever, chills, significant weight change  Denied headache, vision changes  Denied any palpitations  Denied any shortness of breath, cough  Denied any nausea, vomiting, abdominal discomfort,  changes in bowel habits  Denied any dysuria, polyuria    VITALS:  BP 135/63  Pulse 64  Temp 98.5 F (36.9 C) (Oral)  Resp 14  Ht 5\' 2"  (1.575 m)  Wt 103 kg (227 lb 1.2 oz)  BMI 41.53 kg/m2  Body mass index is 41.53 kg/(m^2).    Gen: WDWN. A & O x 4. Appears stated age. Is pleasant and cooperative.   CV: S1 and S2 noted. No S3, S4, murmurs or gallops noted. No carotid bruits.  Resp: CTAB. No wheezing or crackles noted.  Abdomen: Soft. Non-distended. BS x 4. Percussion tympanic in all 4 quadrants. Not TTP throughout.    Neuro: CN II-XII intact. MS 4/5 in left UE and LE.   Psych: Descently groomed. Good eye contact most of the time. Thought process linear and logical. +Racing thoughts. Mood sad. Affect restricted but full and bright at end of visit as pt says she feels better after visit. No SI/HI. No e/o active psychosis. Limited judgement/insight.       LABS AND IMAGING:  Lab Results   Component Value Date    WBCCOUNT 9.7 07/06/2017    HGB 11.5 07/06/2017    SODIUM 140 07/06/2017    K 4.3 07/06/2017    CO2 22 07/06/2017    BUN 12 07/06/2017    CREAT 0.9 07/06/2017    GLU 123 07/06/2017       Lab Results   Component Value Date    A1C 6.6 (H) 07/06/2017     Lab Results   Component Value Date    ALB 3.8 07/06/2017    ALT 26 07/06/2017    AST 25 07/06/2017    BUN 12 07/06/2017    [place] 9.1 07/06/2017    CL 112 (H) 07/06/2017    CHOL 111 07/06/2017    CO2 22 07/06/2017    CREAT 0.9 07/06/2017    GFR >60 07/06/2017    GFRAA >60 07/06/2017    GLU 123 07/06/2017    HGB 11.5 07/06/2017    A1C 6.6 (H) 07/06/2017    PLT 254 07/06/2017    K 4.3 07/06/2017    SODIUM 140 07/06/2017    TRIG 76 07/06/2017       ASSESSMENT/PLAN:    1. Insomnia due to other mental  disorder  Likely component of multiple mental illness including bipolar, schizophrenia, anxiety and depression, restless leg. Can also be adverse effect of olanzapine. Insomnia likely will improve with more effective management of underlying mental disorder. History  inconsistent of maniac episode.   - Educated on sleep hygiene and provided handout   - Smoking cessation or reduction  - Avoid caffeine and sugary drinks  - Mindfulness techniques, [place] guided meditation website given  - Establish care with outside psychiatrist, appt in December  - Cont current med regimen (trazodone)  - traZODone (DESYREL) 100 MG tablet; Take 1 tablet (100 mg) by mouth nightly.  Dispense: 30 tablet; Refill: 0    2. Schizophrenia, unspecified type (CMS-HCC)  3. Bipolar 1 disorder (CMS-HCC)  Not well controlled per patient. Racing thoughts, depressed mood. Pt to establish care with outside psychiatrist, appt in December.   - Obtain records from prior mental health professional  - Establish care with outside psychiatrist, will obtain records once established   - Cont current med regimen (olanzapine), pt may do better with seroquel or other more sedating atypical antipsychotics given her h/o persistent insomnia    4. Restless leg syndrome  Unclear if primary RLS or Olanzapine-induced restlessness. CBC wnl, no e/o iron deficiency anemia or CKD.  - Discuss case with Psych attending Dr. Radonna Ricker re: optimizing medical management  - Consider switching olanzapine to another atypical antipsychotic to reduce insomnia and restlessness (?seroquel)  - Consider stopping ropinirole (pro-dopaminergic which may worsen psychosis) and switch to gabapentin  - Consider testing vitamin B12 and folate level  - Avoid caffeine, alcohol and nicotine  - Counseling on exercise and weight loss next visit  - ropinirole (REQUIP) 4 MG tablet; Take 1 tablet (4 mg) by mouth daily.  Dispense: 60 tablet; Refill: 3    5. Tobacco use disorder  Hx of 61 pack/year smoking. Has COPD diagnosis. Pt pre-contemplative/contemplative, agreed to cut down on smoking  - Discussed tobacco cessation  - Will offer nicotine patch/gum vs chantix on next visit  - Consider low-dose CT for lung cancer screening    6. Bilateral carotid artery stenosis  Per  patient s/p R carotid thromboendarterectomy but still need L side. Asymptomatic.   - Obtain records from prior vascular surgeon (in [place])  - Will refer to vascular surgery for eval of L carotid stenosis after obtaining records  - atorvastatin (LIPITOR) 80 MG tablet; Take 1 tablet (80 mg) by mouth nightly.  Dispense: 60 tablet; Refill: 3  - clopidogrel (PLAVIX) 75 MG tablet; Take 1 tablet (75 mg) by mouth daily.  Dispense: 60 tablet; Refill: 3    7. Cerebrovascular accident (CVA) due to bilateral stenosis of carotid arteries (CMS-HCC)  Likely 2/2 carotid stenosis vs vascular pathology in setting of long standing smoking history and morbid obesity. With residual Left sided weakness and neurological deficits.   - Obtain prior records  - Discuss benefit of PT next visit  - aspirin 81 MG EC tablet; Take 1 tablet (81 mg) by mouth daily.  Dispense: 60 tablet; Refill: 3  - atorvastatin (LIPITOR) 80 MG tablet; Take 1 tablet (80 mg) by mouth nightly.  Dispense: 60 tablet; Refill: 3      RTC Return in about 3 months (around 11/04/2017), or if symptoms worsen or fail to improve, for f/u with Dr. Juel Burrow.     No future appointments.     Patient was given strict return precautions should symptoms worsen or fail to improve for any concerns.  No orders of the defined types were placed in this encounter.

## 2017-08-04 NOTE — Patient Instructions (Addendum)
[place] meditation  [email or web address]

## 2017-08-04 NOTE — Progress Notes (Signed)
    Attending Attestation: I reviewed the key and critical portions of the history and physical as presented by the resident physician. Agree with the above documentation regarding this patient. Patient was discussed with the resident physician and the assessment and plan was then jointly formulated . My additions or revision are included in the record.       Subjective: Please see resident note for further details, but briefly, Regina Ward is a 56 year old female with mental illness (PTSD, bipolar, schizophrenia, anxiety), COPD, bilateral carotid stenosis s/p right thromboenderectomy, CVA with left sided neuro-deficits and chronic insomnia who presents today for establishing care. Seen by Psych, on meds for RLS.        Objective: BP 135/63  Pulse 64  Temp 98.5 F (36.9 C) (Oral)  Resp 14  Ht 5\' 2"  (1.575 m)  Wt 103 kg (227 lb 1.2 oz)  BMI 41.53 kg/m2   Body mass index is 41.53 kg/(m^2).  is as per resident physician. Relevant details below:  + weakness on left side. Able to ambulate well w/o any device      Assessment/Plan: Agree with resident's assessment and plan.     Cont f/u with Psych for her Psych issue, reinforce good sleep hygiene for insomnia, cont same\ meds for RLS, consider Neuro referral for eval,  Smoking cessation. Get record from PCP.  RTC after psych in 3 months      Patient was given strict return precautions should symptoms worsen or fail to improve or any concerns.

## 2017-08-08 NOTE — Progress Notes (Signed)
release of records faxed again today

## 2017-08-16 NOTE — Telephone Encounter (Signed)
Per Dr. Juel BurrowLin     "This is a new pt to our clinic. She has filled out release of records form on her first visit in 06/2017. Please follow up and make sure we get her prior records ASAP. I need records from her PCP, psychiatrist, cardiologist, pulmonologist. "

## 2017-09-02 ENCOUNTER — Telehealth: Payer: Self-pay | Admitting: Student in an Organized Health Care Education/Training Program

## 2017-09-02 DIAGNOSIS — F203 Undifferentiated schizophrenia: Secondary | ICD-10-CM

## 2017-09-02 NOTE — Telephone Encounter (Signed)
[facility] with refill of medication of Trazodone 100 mg also will fax request. Thank you

## 2017-09-03 NOTE — Telephone Encounter (Signed)
Forward message to MD for review

## 2017-09-04 MED ORDER — OLANZAPINE 10 MG OR TABS
10.00 mg | ORAL_TABLET | Freq: Every evening | ORAL | 0 refills | Status: DC
Start: 2017-08-31 — End: 2018-03-07

## 2017-09-04 MED ORDER — TRAZODONE HCL 100 MG OR TABS
100.0000 mg | ORAL_TABLET | Freq: Every evening | ORAL | 0 refills | Status: DC
Start: 2017-09-04 — End: 2018-03-07

## 2017-09-04 NOTE — Telephone Encounter (Signed)
Received request from pharmacy for refill of trazodone. This is patient's chronic medication for insomnia. Will refill for one more month, however for any further refills, pt will have to come in for a visit with PCP Dr. Wende CreaseLinda Lin. Msg to front desk to remind pt to schedule for follow-up with Dr. Juel BurrowLin.

## 2017-09-06 NOTE — Telephone Encounter (Signed)
Received below msg from md. Left pt voicemail advising to call apt ctr for apt.    Refill Request (Trazodone)        [name], Linda, MD  [facility]              Please call pt and let her know that Dr. Juel BurrowLin will refill one more month supply of trazodone. However for any subsequent refills, she will have to come in to see Dr. Juel BurrowLin for a visit. Please help her schedule f/u appt. If no available appt in December and January schedule not open yet, pls ask pt to call back in a couple weeks and check. Thanks, [employer] FarmLinda  Acupuncturist(Routing comment)

## 2017-09-07 NOTE — Telephone Encounter (Signed)
Patient wants to inform doctor LIn that her Psychiatrist will be able to refill her trazodone and olanzapine. She does not need refills anymore.

## 2017-09-08 NOTE — Telephone Encounter (Signed)
FYI .. For Dr. Juel BurrowLin

## 2017-09-13 ENCOUNTER — Telehealth: Payer: Self-pay | Admitting: Student in an Organized Health Care Education/Training Program

## 2017-09-13 NOTE — Telephone Encounter (Signed)
Message forwarded to Dr. Lin for review.

## 2017-09-13 NOTE — Telephone Encounter (Signed)
Message 1 Patient is requesting an urgent physical therapy referral because her net work was change. She would like for doctor to put the referral under STAT not Routine. Please assist. Thank you.    Message 2 Patient would like to confirm if doctor received her medical record records from her previous doctor. Please assist. Thank you.

## 2017-09-16 ENCOUNTER — Ambulatory Visit: Payer: No Typology Code available for payment source | Attending: Family Medicine

## 2017-09-16 VITALS — BP 155/70 | HR 64 | Temp 98.4°F | Resp 12 | Wt 232.6 lb

## 2017-09-16 DIAGNOSIS — M25562 Pain in left knee: Secondary | ICD-10-CM | POA: Insufficient documentation

## 2017-09-16 DIAGNOSIS — M25561 Pain in right knee: Principal | ICD-10-CM | POA: Insufficient documentation

## 2017-09-16 MED ORDER — TRAMADOL HCL 50 MG OR TABS
50.0000 mg | ORAL_TABLET | Freq: Four times a day (QID) | ORAL | 0 refills | Status: DC | PRN
Start: 2017-09-16 — End: 2018-08-03

## 2017-09-16 NOTE — Patient Instructions (Signed)
Get xrays of both knees    Apply ice and hot packs to affected areas    You were referred to physical therapy   Call authorization center at  [phone] if you do not hear from them in 2 weeks regarding your authorization.    Take tramadol as needed for pain

## 2017-09-16 NOTE — Progress Notes (Signed)
56 y/o female    H/O DM and CVA with left-sided paresis    Same day appointment for bilateral knee pain x 1 week  Worse with stairs  Better with stretching  Giving way?  No locking  Not relieved with Tylenol  No recent trauma    Bilateral knees - decreased F/E  Diffuse tenderness  No erythema or effusion    Hips - IR/ER WNL    Xray of knees  Tramadol prn (CURES checked)  PT    Elevated BP today  F/U with PCP to recheck BP

## 2017-09-16 NOTE — Progress Notes (Addendum)
Family Medicine SAME DAY CLINIC    Victorino SparrowNina Allyana Vogan, MD  [facility] Department of Family Medicine Resident   [facility]    Patient: Regina Ward  DOB: Mar 09, 1961  Primary Care Provider: Wende CreaseLin, Linda    Subjective:        HPI: Regina Harmvelyn Brazee is a 56 year old female with mental illness (PTSD, bipolar, schizophrenia, anxiety), COPD, bilateral carotid stenosis s/p right thromboenderectomy, CVA with left sided neuro-deficits and chronic insomnia, and diabetes who presents with chief complaint of Knee Pain (booth )    #Bilateral knee pain x1 week  - 4 years ago, had xray of L knee, was told that cartilage was missing, took Norco at the time but did not get surgery  - 1 week ago, started having pain in both knees  - Feels that she can barely move her knees  - Worse when she is going down stairs, lives on the 2nd floor of the house  - Stretching makes the pain better  - No locking sensation, possible buckling   - No h/o recent trauma/injury  - Has tried lidocaine patch, Salonpas, Tylenol which have not helped with the pain    PAST MEDICAL HISTORY:  Patient Active Problem List   Diagnosis   . Insomnia due to other mental disorder   . Bilateral carotid artery stenosis   . Schizophrenia (CMS-HCC)   . Stroke (cerebrum) (CMS-HCC)   . COPD with asthma (CMS-HCC)   . Depression   . Bipolar 1 disorder (CMS-HCC)   . Restless leg syndrome   . Posttraumatic stress disorder       ALLERGIES:  No Known Allergies    MEDICATIONS:   Current Outpatient Medications   Medication Sig Dispense Refill   . aspirin 81 MG EC tablet Take 1 tablet (81 mg) by mouth daily. 60 tablet 3   . atorvastatin (LIPITOR) 80 MG tablet Take 1 tablet (80 mg) by mouth nightly. 60 tablet 3   . clopidogrel (PLAVIX) 75 MG tablet Take 1 tablet (75 mg) by mouth daily. 60 tablet 3   . furosemide (LASIX) 20 MG tablet Take 1 tablet (20 mg) by mouth daily. 60 tablet 3   . nitroGLYcerin (NITROSTAT) 0.4 MG SL tablet 1 tablet (0.4 mg) by Sublingual route every 5 minutes as  needed for Chest Pain. up to 3 tabs per episode. 1 bottle 1   . OLANZapine (ZYPREXA) 10 MG tablet Take 10 mg by mouth nightly.  0   . potassium chloride (K-DUR) 10 MEQ Sustained-Release tablet Take 1 tablet (10 mEq) by mouth daily. 60 tablet 3   . ropinirole (REQUIP) 4 MG tablet Take 1 tablet (4 mg) by mouth daily. 60 tablet 3   . traMADol (ULTRAM) 50 MG tablet Take 1 tablet (50 mg) by mouth every 6 hours as needed for Moderate Pain (Pain Score 4-6). 28 tablet 0   . traZODone (DESYREL) 100 MG tablet Take 1 tablet (100 mg) by mouth nightly. 30 tablet 0     No current facility-administered medications for this visit.        REVIEW OF SYSTEMS:  10 system ROS was performed. Pertinent positives/negatives have been indicated as above.    Objective:     PHYSICAL EXAMINATION:  BP 155/70 (BP Location: Right arm, BP Patient Position: Sitting, BP cuff size: Regular)   Pulse 64   Temp 98.4 F (36.9 C) (Oral)   Resp 12   Wt 105.5 kg (232 lb 9.4 oz)  LMP  (LMP Unknown)   BMI 42.54 kg/m     General: No acute distress.  CV: RRR no MRG  Pulm: CTAB no WRR  Hips: normal internal and external rotation b/l  Knees: no obvious deformities, diffuse tenderness to palpation including medial/lateral joint lines and patellar facets, limited ROM due to pain, neg anterior/posterior drawer tests, neg valgus/varus tests, unable to perform Thessaly due to pain    LABS/IMAGING:  Lab Results   Component Value Date    CREAT 0.9 07/06/2017    GFR >60 07/06/2017    SODIUM 140 07/06/2017    K 4.3 07/06/2017    GLU 123 07/06/2017    HGB 11.5 07/06/2017    AST 25 07/06/2017    ALT 26 07/06/2017    A1C 6.6 (H) 07/06/2017    CHOL 111 07/06/2017    TRIG 76 07/06/2017       Assessment/Plan:    1. Pain in both knees  Most likely OA vs patellofemoral vs meniscal injury.  Given acute worsening, will obtain xrays.  Will prescribe a trial of tramadol instead of NSAIDs given h/o CVA and elevated blood pressure.  - X-Ray Knee 1 Or 2 Views - Bilateral;  Future  - Consult to Physical Therapy - External  - traMADol (ULTRAM) 50 MG tablet; Take 1 tablet (50 mg) by mouth every 6 hours as needed for Moderate Pain (Pain Score 4-6).  Dispense: 28 tablet; Refill: 0  - CURES Review Documentation - I Reviewed CURES  - Apply ice and heating packs to affected areas  - Consider CSI if symptoms persist or worsen      #RTC: Return in about 1 month (around 10/17/2017) for knee pain, HTN follow-up with Dr. Juel BurrowLin.    Patient discussed with attending, Dr. Anselm JunglingHo.    Victorino SparrowNina Mysha Peeler, PGY-3  [facility]  pager: [phone]

## 2017-09-18 NOTE — Telephone Encounter (Signed)
Pt was seen in Same Day Clinic on 09/16/17 by  Dr. Victorino SparrowNina Tsai. Referral to PT placed.

## 2017-10-25 ENCOUNTER — Ambulatory Visit
Payer: No Typology Code available for payment source | Attending: Family Medicine | Admitting: Student in an Organized Health Care Education/Training Program

## 2017-10-25 ENCOUNTER — Encounter: Payer: Self-pay | Admitting: Student in an Organized Health Care Education/Training Program

## 2017-10-25 VITALS — BP 129/68 | HR 68 | Temp 97.8°F | Resp 22 | Ht 62.0 in | Wt 231.5 lb

## 2017-10-25 DIAGNOSIS — J449 Chronic obstructive pulmonary disease, unspecified: Secondary | ICD-10-CM | POA: Insufficient documentation

## 2017-10-25 DIAGNOSIS — R29898 Other symptoms and signs involving the musculoskeletal system: Secondary | ICD-10-CM | POA: Insufficient documentation

## 2017-10-25 DIAGNOSIS — Z1331 Encounter for screening for depression: Secondary | ICD-10-CM | POA: Insufficient documentation

## 2017-10-25 DIAGNOSIS — I6522 Occlusion and stenosis of left carotid artery: Secondary | ICD-10-CM | POA: Insufficient documentation

## 2017-10-25 DIAGNOSIS — F3175 Bipolar disorder, in partial remission, most recent episode depressed: Secondary | ICD-10-CM | POA: Insufficient documentation

## 2017-10-25 DIAGNOSIS — G4733 Obstructive sleep apnea (adult) (pediatric): Secondary | ICD-10-CM | POA: Insufficient documentation

## 2017-10-25 DIAGNOSIS — F17200 Nicotine dependence, unspecified, uncomplicated: Secondary | ICD-10-CM | POA: Insufficient documentation

## 2017-10-25 NOTE — Progress Notes (Signed)
Ambulatory History and Physical Examination    October 25, 2017    Language: [place]     CC:   Chief Complaint   Patient presents with   . Hypertension         HPI:  Regina Ward is a 57 year old female who has a past medical history of mental illness (PTSD, bipolar, schizophrenia, anxiety), COPD, bilateral carotid stenosis s/p right thromboenderectomy, CVA with left sided neuro-deficits and chronic insomnia, and diabetes who presents today for follow-up.     #Bipolar, Schizophrenia, Anxiety, PTSD  - Recently established care with outside psychiatrist, no changes to her meds yet  - Have follow-up in February  - Continues to have depressed moods but no active SI/HI, mood about the same as last visit  - Thoughts revolves around not able to reconnect with her children whom she had put up for adoption  - Endorses racing thoughts and trouble sleeping (now on trazodone, prescribed by her psychiatrist)  - Has not tried [place] guided meditation site yet but says she would    #HTN  Blood Pressure   10/25/17 129/68   09/16/17 155/70   08/04/17 135/63     # LUE weakness s/p CVA in 2018   - secondary to high grade bilateral carotid stenosis s/p right CEA 12/2016  - requesting referral to [facility]  - left hand not as strong and mobile as prior to stroke and desires strengthening and increase range of motion.    # Left Carotid Stenosis  - Patient would like to know if PCP received her records from her vascular surgeon in [place] re: left carotid stenosis. She was told by prior surgeon that she will need an left carotid endarterectomy.    - As of 01/2017, right internal carotid with 1-15% diameter reduction in lumen while left internal carotid with 50-79% reduction. Per prior note in 12/2016, left carotid was noted to to have high grade stenosis >90% on CT.     # Tobacco Use  - Current heavy daily smoker, contemplating quitting but difficult   - working on cutting down, prefers to cut down first before starting  nicotine patch   - requesting nicotine patch prescription to be sent and will start once able to cut down # of cigarettes in half       PAST MEDICAL HISTORY:  Patient Active Problem List   Diagnosis   . Insomnia due to other mental disorder   . Bilateral carotid artery stenosis   . Schizophrenia (CMS-HCC)   . Stroke (cerebrum) (CMS-HCC)   . COPD with asthma (CMS-HCC)   . Depression   . Bipolar 1 disorder (CMS-HCC)   . Restless leg syndrome   . Posttraumatic stress disorder       SURGICAL HISTORY:   Past Surgical History:   Procedure Laterality Date   . THROMBOENDARTERECTOMY Right 04/2017    Carotd endarterectomy   . TOTAL ABDOMINAL HYSTERECTOMY W/ BILATERAL SALPINGOOPHORECTOMY  1993       MEDICATIONS:  Outpatient Medications Marked as Taking for the 10/25/17 encounter (Office Visit) with Wende Crease, MD   Medication Sig Dispense Refill   . aspirin 81 MG EC tablet Take 1 tablet (81 mg) by mouth daily. 60 tablet 3   . atorvastatin (LIPITOR) 80 MG tablet Take 1 tablet (80 mg) by mouth nightly. 60 tablet 3   . clopidogrel (PLAVIX) 75 MG tablet Take 1 tablet (75 mg) by mouth daily. 60 tablet 3   .  furosemide (LASIX) 20 MG tablet Take 1 tablet (20 mg) by mouth daily. 60 tablet 3   . nitroGLYcerin (NITROSTAT) 0.4 MG SL tablet 1 tablet (0.4 mg) by Sublingual route every 5 minutes as needed for Chest Pain. up to 3 tabs per episode. 1 bottle 1   . OLANZapine (ZYPREXA) 10 MG tablet Take 10 mg by mouth nightly.  0   . potassium chloride (K-DUR) 10 MEQ Sustained-Release tablet Take 1 tablet (10 mEq) by mouth daily. 60 tablet 3   . ropinirole (REQUIP) 4 MG tablet Take 1 tablet (4 mg) by mouth daily. 60 tablet 3   . traZODone (DESYREL) 100 MG tablet Take 1 tablet (100 mg) by mouth nightly. (Patient taking differently: Take 50 mg by mouth nightly.  ) 30 tablet 0       ALLERGIES:  No Known Allergies    FAMILY HISTORY:  Denies any heart disease in family  Denies any cancers in family    Social History     Socioeconomic History   .  Marital status: Single     Spouse name: Not on file   . Number of children: Not on file   . Years of education: Not on file   . Highest education level: Not on file   Occupational History   . Not on file   Tobacco Use   . Smoking status: Current Every Day Smoker     Packs/day: 1.50     Years: 41.00     Pack years: 61.50     Types: Cigarettes   . Smokeless tobacco: Never Used   Substance and Sexual Activity   . Alcohol use: No   . Drug use: No     Comment: ex drug use 2016 - cocaine   . Sexual activity: No     Comment: Not sexually active in 3 years   Social Activities of Daily Living Present   . Not on file   Social History Narrative   . Not on file       HEALTH MAINTENANCE:  Most Recent Immunizations   Administered Date(s) Administered   . Influenza Vaccine >=3 Years 07/01/2017       REVIEW OF SYSTEMS:  Denied fever, chills, significant weight change  Denied headache, vision changes  Denied any palpitations  Denied any shortness of breath, cough  Denied any nausea, vomiting, abdominal discomfort, changes in bowel habits  Denied any dysuria, polyuria    VITALS:  BP 129/68 (BP Location: Left arm, BP Patient Position: Sitting)   Pulse 68   Temp 97.8 F (36.6 C) (Oral)   Resp 22   Ht 5\' 2"  (1.575 m)   Wt 105 kg (231 lb 7.7 oz)   LMP  (LMP Unknown)   Breastfeeding? No   BMI 42.34 kg/m   Body mass index is 42.34 kg/m.    Gen: WDWN. A & O x 4. Appears stated age. Is pleasant and cooperative.   CV: S1 and S2 noted. No S3, S4, murmurs or gallops noted. No carotid bruits.  Resp: CTAB. No wheezing or crackles noted.  Abdomen: Soft. Non-distended. Non-TTP. +BS.  Neuro: CN II-XII intact. MS 4/5 in left UE and LE.   Psych: Descently groomed. Good eye contact. Thought process linear and logical. +Racing thoughts. Mood sad. Affect full. No SI/HI. No e/o active psychosis. Limited judgement/insight.       LABS AND IMAGING:  Lab Results   Component Value Date    WBCCOUNT 9.7 07/06/2017  HGB 11.5 07/06/2017    SODIUM 140  07/06/2017    K 4.3 07/06/2017    CO2 22 07/06/2017    BUN 12 07/06/2017    CREAT 0.9 07/06/2017    GLU 123 07/06/2017       Lab Results   Component Value Date    A1C 6.6 (H) 07/06/2017     Lab Results   Component Value Date    ALB 3.8 07/06/2017    ALT 26 07/06/2017    AST 25 07/06/2017    BUN 12 07/06/2017    [place] 9.1 07/06/2017    CL 112 (H) 07/06/2017    CHOL 111 07/06/2017    CO2 22 07/06/2017    CREAT 0.9 07/06/2017    GFR >60 07/06/2017    GFRAA >60 07/06/2017    GLU 123 07/06/2017    HGB 11.5 07/06/2017    A1C 6.6 (H) 07/06/2017    PLT 254 07/06/2017    K 4.3 07/06/2017    SODIUM 140 07/06/2017    TRIG 76 07/06/2017       ASSESSMENT/PLAN:    1. Schizophrenia, unspecified type (CMS-HCC)  2. Bipolar 1 disorder (CMS-HCC)  Unchanged, not improved. Racing thoughts, depressed mood. Now under management of outside psychiatrist and also has scheduled counseling. Anticipate will need multiple approaches and titration of her meds to stabilize and support her mood. No SI/HI though PHQ9 27/27. Reassured that pt has good family support and that mood per patient has not changed since last visit. (See below for suicide risk assessment.)   - Obtain records from mental health professional  - Management per outside psychiatrist  - Support and counseling for depressed mood given today  - [place] guided meditation web site given to enhance coping skills  - Manage insomnia to get better sleep quality    3. Bilateral carotid artery stenosis  Per patient s/p R carotid thromboendarterectomy but still need L CEA. Asymptomatic. Prior vascular surgery records now in Epic.   - Vascular Surgery referral for L CEA  - Continue ASA, clopidogrel, atorvastatin  - Counseled on importance of smoke cessation    4. LUE/LLE Weakness s/p Cerebrovascular accident (CVA) due to bilateral stenosis of carotid arteries (CMS-HCC)  Likely 2/2 carotid stenosis vs vascular pathology in setting of long standing smoking history and morbid obesity. With residual  Left sided weakness and neurological deficits.   - Outside records (scanned on 10/05/2017) now in chart  - PT referral for LUE/hand ROM and mobility (already has BLE for knee pain)  - Cont current med regimen  - Counseled on keeping active    5. Tobacco use disorder  Hx of 61 pack/year smoking. Has COPD diagnosis. Pt pre-contemplative/contemplative, agreed to cut down on smoking.   - Counseled on importance of tobacco cessation given significant co-morbidity  - Nicotine patch prescribed for pt to start when she is able to cut down to 10-15 cigarettes a day  - H/o COPD with asthma will refer to [facility] for PFTs, evaluation and management  - Consider low-dose CT for lung cancer screening      RTC Return in about 3 months (around 01/23/2018) for f/u with Dr. Juel Burrow.     No future appointments.    A suicide risk assessment was performed with the patient.  Modifiable risk factors include precipitating stressors and severe medical illness. Non-modifiable risk factors include existing psychiatric diagnoses, history of childhood trauma and single status. The patient also has protective factors of future life plans, therapeutic  relationships and access to health care.      Based on the above risk and protective factors, there was a discussion with the patient about future planning and mobilizing social supports.  Patient was able to actively engage in future planning.  Though patient was unable to specifically identify social supports that will be mobilized initially; she was able to indicate her cousin who was with her at this appointment after some discussion.     Therefore, the patient was assessed to be at a low acute risk of self-harm.    Reviewed PHQ score PHQ9 Patient Summary Score (calculated): 27 and discussed options with patient and employed shared-decision making in developing follow-up plan.       Instructed patient to return to mental health professional for follow up.  The patient denied suicidal intent and felt safe  to go home.  I counseled the patient to call 911 or the suicide hot-line or go directly to the emergency room if symptoms worsen or the patient develops suicidal intent.    Patient was given strict return precautions should symptoms worsen or fail to improve for any concerns.    Orders Placed This Encounter   Procedures   . Consult to Physical Therapy - External   . Consult/Referral to Vascular Surgery         -------------------------    57 year old female with multiple medical problems.  Followed by outside psychiatry for mental health problems.  Hx carotid artery stenosis, pending consult with vascular surgery.  Pending consult to PT, post CVA.  O: VS above   PE above  A/P  1. Schizophrenia  2. Bipolar disorder  3. Carotid artery stenosis  4. Hx CVA  Plans as above.  Jilda PandaKathryn M. Larsen, M.D.      Reviewed PHQ score PHQ9 Patient Summary Score (calculated): 27 and discussed options with patient and employed shared-decision making in developing follow-up plan.

## 2017-10-25 NOTE — Patient Instructions (Addendum)
- Please cut back in smoking  - You are referred to hand physical therapy and vascular surgeon  - Come back and see me in 3 months    Smoking Tobacco Information  Smoking tobacco will very likely harm your health. Tobacco contains a poisonous (toxic), colorless chemical called nicotine. Nicotine affects the brain and makes tobacco addictive. This change in your brain can make it hard to stop smoking. Tobacco also has other toxic chemicals that can hurt your body and raise your risk of many cancers.  How can smoking tobacco affect me?  Smoking tobacco can increase your chances of having serious health conditions, such as:   Cancer. Smoking is most commonly associated with lung cancer, but can lead to cancer in other parts of the body.   Chronic obstructive pulmonary disease (COPD). This is a long-term lung condition that makes it hard to breathe. It also gets worse over time.   High blood pressure (hypertension), heart disease, stroke, or heart attack.   Lung infections, such as pneumonia.   Cataracts. This is when the lenses in the eyes become clouded.   Digestive problems. This may include peptic ulcers, heartburn, and gastroesophageal reflux disease (GERD).   Oral health problems, such as gum disease and tooth loss.   Loss of taste and smell.    Smoking can affect your appearance by causing:   Wrinkles.   Yellow or stained teeth, fingers, and fingernails.    Smoking tobacco can also affect your social life.   Many workplaces, [employer], hotels, and public places are tobacco-free. This means that you may experience challenges in finding places to smoke when away from home.   The cost of a smoking habit can be expensive. Expenses for someone who smokes come in two ways:  ? You spend money on a regular basis to buy tobacco.  ? Your health care costs in the long-term are higher if you smoke.   Tobacco smoke can also affect the health of those around you. Children of smokers have greater chances  of:  ? Sudden infant death syndrome (SIDS).  ? Ear infections.  ? Lung infections.    What lifestyle changes can be made?   Do not start smoking. Quit if you already do.   To quit smoking:  ? Make a plan to quit smoking and commit yourself to it. Look for programs to help you and ask your health care provider for recommendations and ideas.  ? Talk with your health care provider about using nicotine replacement medicines to help you quit. Medicine replacement medicines include gum, lozenges, patches, sprays, or pills.  ? Do not replace cigarette smoking with electronic cigarettes, which are commonly called e-cigarettes. The safety of e-cigarettes is not known, and some may contain harmful chemicals.  ? Avoid places, people, or situations that tempt you to smoke.  ? If you try to quit but return to smoking, don't give up hope. It is very common for people to try a number of times before they fully succeed. When you feel ready again, give it another try.   Quitting smoking might affect the way you eat as well as your weight. Be prepared to monitor your eating habits. Get support in planning and following a healthy diet.   Ask your health care provider about having regular tests (screenings) to check for cancer. This may include blood tests, imaging tests, and other tests.   Exercise regularly. Consider taking walks, joining a gym, or doing yoga or exercise classes.  Develop skills to manage your stress. These skills include meditation.  What are the benefits of quitting smoking?  By quitting smoking, you may:   Lower your risk of getting cancer and other diseases caused by smoking.   Live longer.   Breathe better.   Lower your blood pressure and heart rate.   Stop your addiction to tobacco.   Stop creating secondhand smoke that hurts other people.   Improve your sense of taste and smell.   Look better over time, due to having fewer wrinkles and less staining.    What can happen if changes are not  made?  If you do not stop smoking, you may:   Get cancer and other diseases.   Develop COPD or other long-term (chronic) lung conditions.   Develop serious problems with your heart and blood vessels (cardiovascular system).   Need more tests to screen for problems caused by smoking.   Have higher, long-term healthcare costs from medicines or treatments related to smoking.   Continue to have worsening changes in your lungs, mouth, and nose.    Where to find support:  To get support to quit smoking, consider:   Asking your health care provider for more information and resources.   Taking classes to learn more about quitting smoking.   Looking for local organizations that offer resources about quitting smoking.   Joining a support group for people who want to quit smoking in your local community.    Where to find more information:  You may find more information about quitting smoking from:   HelpGuide.org: www.helpguide.org/articles/addictions/how-to-quit-smoking.htm   BankRights[employer]uy: smokefree.gov   American Lung Association: www.lung.org    Contact a health care provider if:   You have problems breathing.   Your lips, nose, or fingers turn blue.   You have chest pain.   You are coughing up blood.   You feel faint or you pass out.   You have other noticeable changes that cause you to worry.  Summary   Smoking tobacco can negatively affect your health, the health of those around you, your finances, and your social life.   Do not start smoking. Quit if you already do. If you need help quitting, ask your health care provider.   Think about joining a support group for people who want to quit smoking in your local community. There are many effective programs that will help you to quit this behavior.  This information is not intended to replace advice given to you by your health care provider. Make sure you discuss any questions you have with your health care provider.  Document Released: 10/05/2016  Document Revised: 10/05/2016 Document Reviewed: 10/05/2016  Elsevier Interactive Patient Education  [employer].

## 2017-10-26 ENCOUNTER — Encounter: Payer: Self-pay | Admitting: Student in an Organized Health Care Education/Training Program

## 2017-10-26 ENCOUNTER — Telehealth: Payer: Self-pay | Admitting: Student in an Organized Health Care Education/Training Program

## 2017-10-26 DIAGNOSIS — Z8542 Personal history of malignant neoplasm of other parts of uterus: Secondary | ICD-10-CM | POA: Insufficient documentation

## 2017-10-26 DIAGNOSIS — G4733 Obstructive sleep apnea (adult) (pediatric): Secondary | ICD-10-CM | POA: Insufficient documentation

## 2017-10-26 DIAGNOSIS — E1142 Type 2 diabetes mellitus with diabetic polyneuropathy: Secondary | ICD-10-CM | POA: Insufficient documentation

## 2017-10-26 MED ORDER — NICOTINE 21 MG/24HR TD PT24
MEDICATED_PATCH | TRANSDERMAL | 0 refills | Status: DC
Start: 2017-10-26 — End: 2018-03-07

## 2017-10-26 MED ORDER — NICOTINE 14 MG/24HR TD PT24
MEDICATED_PATCH | TRANSDERMAL | 0 refills | Status: DC
Start: 2017-10-26 — End: 2018-03-07

## 2017-10-26 MED ORDER — NICOTINE 7 MG/24HR TD PT24
MEDICATED_PATCH | TRANSDERMAL | 0 refills | Status: DC
Start: 2017-10-26 — End: 2018-03-07

## 2017-10-26 NOTE — Telephone Encounter (Signed)
Msg routed to clinic staff re: following...    " Please call patient for the following...  - I referred you to a pulmonologist so your COPD and asthma can be evaluated and managed.  - Please call our [facility] at [phone] to follow up on your specialist referrals (vascular surgery, physical therapy and pulmonology)  - When you meet with your psychiatrist, please ask them to fax Dr. Juel BurrowLin a copy of your visits for our records so I can stay updated.  - I sent nicotine patch prescription to your pharmacy but I think it would be best for you to come in and see me before starting. So, please work on cutting down and  make an appointment in one month so we can discuss how to use the nicotine patches."

## 2017-10-27 NOTE — Progress Notes (Signed)
Attending Note:    57 year old female with multiple medical problems.  Followed by outside psychiatry for mental health problems.  Hx carotid artery stenosis, pending consult with vascular surgery.  Pending consult to PT, post CVA.  O: VS above   PE above  A/P  1. Schizophrenia  2. Bipolar disorder  3. Carotid artery stenosis  4. Hx CVA  Plans as above.  Jilda PandaKathryn M. Antonea Gaut, M.D.

## 2017-10-29 ENCOUNTER — Encounter: Payer: Self-pay | Admitting: Student in an Organized Health Care Education/Training Program

## 2017-10-29 DIAGNOSIS — M25561 Pain in right knee: Secondary | ICD-10-CM

## 2017-10-29 NOTE — Progress Notes (Addendum)
XR Bilateral Knee   DOS 09/20/2017    History: [age] yoF with h/o CVA d/t bilateral carotid stenosis with pain for one month    Findings:  Distal aspects of both femurs and proximal tibial diaphyses reveal sclerotic lesions located within intramedullary cavity. There appears to be a "rings and arcs pattern" suggestive of enchondroma, however given multiple locations of this finding, most likely represent bone infarcts. No osseous deformity or pathologic fracture. Normal bilateral knee joints.     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 Osteonecrosis etiology considered including ?prolonged steroid use vs h/o excess EtOH use vs SLE vs sickle cell. However as far as I know, no pre-existing history of above condition except she did have history significant for substance use. Will need clarification. Also need further evaluation for extent of osteonecrosis to evaluate for risk of future fractures and collapse.     Plan:  - Scan report into eChart  - Further imaging: XR Femur (including femur head) vs BLE extremity vs MRI (gold standard for osteonecrosis).  - Will Discuss case with Sports Med attending for next step management  - May consider orthopedic consult for evaluation and management if indicated      Addendum:  - After discussing case over with attendings Dr. Selena BattenKim and Dr. Belva Ageeow, will obtain MRI of knees and refer pt to Ortho.   - Will consider working patient up for sickle cell, SLE.   - Will inquire if history positive for prolonged steroid use or excess EtOH use on next visit.

## 2017-11-05 ENCOUNTER — Encounter: Payer: Self-pay | Admitting: Student in an Organized Health Care Education/Training Program

## 2017-11-05 DIAGNOSIS — R29898 Other symptoms and signs involving the musculoskeletal system: Secondary | ICD-10-CM

## 2017-11-05 NOTE — Progress Notes (Signed)
Received msg from attending Dr. Diana Evesan Nguyen re: pt's Pt referral needing to change to OT instead. Will ask case coordinator to help follow up on referral.

## 2017-11-18 ENCOUNTER — Ambulatory Visit
Payer: No Typology Code available for payment source | Admitting: Student in an Organized Health Care Education/Training Program

## 2017-11-24 ENCOUNTER — Telehealth: Payer: Self-pay

## 2017-11-24 NOTE — Telephone Encounter (Signed)
Called and spoke with patient. Reviewed instructions as provided by Dr. Juel BurrowLin:    Please call pt re: findings on XR knee bilateral     "- Your XR knee noted for some lesions or abnormalities in the bones.   - I ordered a MRI to get a better look at your bones. Please have it done as soon as you are authorized by insurance to do so.   - Meanwhile, as referrals usually takes some time, I started the referral process for you to see an orthopedic surgeon to discuss what these imaging findings may be.   - You should have your MRI done before you go and see the orthopedic surgeon.   - As for f/u with me, unfortunately, I will be on medical leave until end of May. While I am gone, please schedule your appointment with my colleagues Drs Irena CordsEkta Patel, Burman Fosteraina Shah or Victorino SparrowNina Tsai. "     Provided patient with phone number to CAU to follow up on authorizations for MRI and orthopedic surgeon. Follow up appointment made with Dr. Dora Simssai for 01/31/18. Patient verbalized understanding. No further questions or concerns.   RCornejo, RN

## 2017-11-24 NOTE — Telephone Encounter (Signed)
Called patient to review instructions as provided by Dr. Juel BurrowLin:    Please call pt re: findings on XR knee bilateral     "- Your XR knee noted for some lesions or abnormalities in the bones.   - I ordered a MRI to get a better look at your bones. Please have it done as soon as you are authorized by insurance to do so.   - Meanwhile, as referrals usually takes some time, I started the referral process for you to see an orthopedic surgeon to discuss what these imaging findings may be.   - You should have your MRI done before you go and see the orthopedic surgeon.   - As for f/u with me, unfortunately, I will be on medical leave until end of May. While I am gone, please schedule your appointment with my colleagues Drs Irena CordsEkta Patel, Burman Fosteraina Shah or Victorino SparrowNina Tsai. "     No answer. Left message with call back number.  RCornejo, RN

## 2017-11-24 NOTE — Telephone Encounter (Signed)
Patient is returning Roxana's call. Can you please try calling her back? Thank you. Tried transferring call but no answer.

## 2017-11-24 NOTE — Addendum Note (Signed)
Addended by: Ailene RavelLIN, LINDA H on: 11/24/2017 05:38 AM     Modules accepted: Orders

## 2017-11-24 NOTE — Telephone Encounter (Signed)
Called patient. No answer. Left message with call back number.  RCornejo, RN

## 2017-11-28 ENCOUNTER — Telehealth: Payer: Self-pay

## 2017-11-28 NOTE — Telephone Encounter (Signed)
Called patient, and spoke with Synetta FailAnita, patient's caregiver. Per Synetta FailAnita, patient cancelled OT appointment on Friday due to mother being sick.   Per Synetta FailAnita, she will contact [facility] to re-schedule appointment.       -Nance PearAdriana Tagan Bartram, Sr.LVN

## 2017-12-05 NOTE — Telephone Encounter (Signed)
Attempted to Call [facility] to confirm patient has rescheduled an appointment. Clinic did not answer call after multiple attempts.

## 2018-01-31 ENCOUNTER — Encounter: Payer: Self-pay | Admitting: Family Practice

## 2018-02-01 ENCOUNTER — Ambulatory Visit: Payer: No Typology Code available for payment source | Attending: Family Medicine

## 2018-02-01 VITALS — BP 133/78 | HR 91 | Temp 98.5°F | Resp 12 | Wt 235.9 lb

## 2018-02-01 DIAGNOSIS — M25562 Pain in left knee: Secondary | ICD-10-CM | POA: Insufficient documentation

## 2018-02-01 DIAGNOSIS — M25561 Pain in right knee: Principal | ICD-10-CM | POA: Insufficient documentation

## 2018-02-01 MED ORDER — QUETIAPINE FUMARATE 50 MG OR TABS
50.00 mg | ORAL_TABLET | Freq: Every evening | ORAL | 0 refills | Status: DC
Start: 2018-01-27 — End: 2018-03-07

## 2018-02-01 MED ORDER — IBUPROFEN 600 MG OR TABS
ORAL_TABLET | ORAL | 0 refills | Status: DC
Start: 2018-01-18 — End: 2018-08-03

## 2018-02-01 MED ORDER — ACETAMINOPHEN 500 MG OR TABS
1000.0000 mg | ORAL_TABLET | Freq: Three times a day (TID) | ORAL | 0 refills | Status: DC | PRN
Start: 2018-02-01 — End: 2018-08-03

## 2018-02-01 MED ORDER — HYDROCODONE-ACETAMINOPHEN 5-325 MG OR TABS
ORAL_TABLET | ORAL | 0 refills | Status: DC
Start: 2018-01-18 — End: 2018-03-07

## 2018-02-01 MED ORDER — DICLOFENAC SODIUM 1 % EX GEL
1.0000 g | Freq: Four times a day (QID) | TRANSDERMAL | 0 refills | Status: DC
Start: 2018-02-01 — End: 2018-04-07

## 2018-02-01 MED ORDER — OLANZAPINE 15 MG OR TABS
15.00 mg | ORAL_TABLET | Freq: Every evening | ORAL | 0 refills | Status: DC
Start: 2018-01-24 — End: 2018-03-07

## 2018-02-01 MED ORDER — CYCLOBENZAPRINE HCL 10 MG OR TABS
10.00 mg | ORAL_TABLET | Freq: Three times a day (TID) | ORAL | 0 refills | Status: DC
Start: 2018-01-26 — End: 2018-03-07

## 2018-02-01 NOTE — Progress Notes (Signed)
Attending Note:    Agree with the resident documentation regarding this patient. Patient was discussed with the resident physician, Dr. Nedra Hai. The assessment and plan was then jointly formulated with the resident physician.    BP 133/78 (BP Location: Right arm, BP Patient Position: Sitting, BP cuff size: Large)    Pulse 91    Temp 98.5 F (36.9 C) (Oral)    Resp 12    Wt 107 kg (235 lb 14.3 oz)    LMP  (LMP Unknown)    BMI 43.15 kg/m     A and P  1.  Knee Pain bilateral - Stable.  Obtain imaging, attempt symptom control with prescription written today.  See resident note for further details    Arsenio Katz, M.D.  Assistant Clinical Professor  [facility] Department of Family Medicine

## 2018-02-01 NOTE — Patient Instructions (Signed)
Thank you for allowing me to assist in your care today.  If you have any questions or concerns after today's visit, please call [phone].    Please go to your pharmacy for your medication

## 2018-02-01 NOTE — Progress Notes (Signed)
Same Day Clinic Note  [facility]  Feb 01, 2018    HPI:  Regina Ward is a 57 year old female with:   Patient Active Problem List   Diagnosis    Insomnia due to other mental disorder    Bilateral carotid artery stenosis    Schizophrenia (CMS-HCC)    Stroke (cerebrum) (CMS-HCC)    COPD with asthma (CMS-HCC)    Depression    Bipolar 1 disorder (CMS-HCC)    Restless leg syndrome    Posttraumatic stress disorder    Type 2 diabetes mellitus without complication, without long-term current use of insulin (CMS-HCC)    OSA (obstructive sleep apnea)    History of uterine cancer      presents to same day clinic today for knee pain.    #Knee Pain:   - patient endorse 4 months of worsening pain, states never had before but per chart review had it 4 years ago as well  - worse at night after a long day of activity  - Stretching makes the pain better  - No locking sensation, possible buckling   - No h/o recent trauma/injury  - takes hydrocodone for breakthrough   - hasn't tried tylenol or voltaren  - still able to ambulate fine   - PT 1 month now 4 session per Dr. Wende Crease has been helping a lot  - denies fever     Medications:  Outpatient Medications Marked as Taking for the 02/01/18 encounter (Office Visit) with [facility]   Medication Sig Dispense Refill    aspirin 81 MG EC tablet Take 1 tablet (81 mg) by mouth daily. 60 tablet 3    atorvastatin (LIPITOR) 80 MG tablet Take 1 tablet (80 mg) by mouth nightly. 60 tablet 3    clopidogrel (PLAVIX) 75 MG tablet Take 1 tablet (75 mg) by mouth daily. 60 tablet 3    cyclobenzaprine (FLEXERIL) 10 MG tablet Take 10 mg by mouth 3 times daily.  0    furosemide (LASIX) 20 MG tablet Take 1 tablet (20 mg) by mouth daily. 60 tablet 3    HYDROcodone-acetaminophen (NORCO) 5-325 MG tablet take 1 tablet by mouth every 8 hours for 7 days if needed for pain  0    ibuprofen (MOTRIN) 600 MG tablet take 1 tablet by mouth every 8 hours 7 days  0     nicotine (NICOTINE STEP 1) 21 MG/24HR patch Step 1: apply 1 patch on skin every 24 hours for weeks 1-6 after stop smoking 42 patch 0    nicotine (NICOTINE STEP 2) 14 MG/24HR Step 2: apply 1 patch on skin every 24 hours for weeks 7-8 after stop smoking 14 patch 0    nicotine (NICOTINE STEP 3) 7 MG/24HR Step 3: apply 1 patch on skin every 24 hours for weeks 9-10 after stop smoking 14 patch 0    nitroGLYcerin (NITROSTAT) 0.4 MG SL tablet 1 tablet (0.4 mg) by Sublingual route every 5 minutes as needed for Chest Pain. up to 3 tabs per episode. 1 bottle 1    OLANZapine (ZYPREXA) 10 MG tablet Take 10 mg by mouth nightly.  0    olanzapine (ZYPREXA) 15 MG tablet Take 15 mg by mouth nightly.  0    potassium chloride (K-DUR) 10 MEQ Sustained-Release tablet Take 1 tablet (10 mEq) by mouth daily. 60 tablet 3    quetiapine (SEROQUEL) 50 MG tablet Take 50 mg by mouth nightly.  0  ropinirole (REQUIP) 4 MG tablet Take 1 tablet (4 mg) by mouth daily. 60 tablet 3    traMADol (ULTRAM) 50 MG tablet Take 1 tablet (50 mg) by mouth every 6 hours as needed for Moderate Pain (Pain Score 4-6). 28 tablet 0    traZODone (DESYREL) 100 MG tablet Take 1 tablet (100 mg) by mouth nightly. (Patient taking differently: Take 50 mg by mouth nightly.  ) 30 tablet 0     Allergies:  Allergies   Allergen Reactions    Quetiapine Other       Physical Exam:  Blood pressure 133/78, pulse 91, temperature 98.5 F (36.9 C), temperature source Oral, resp. rate 12, weight 107 kg (235 lb 14.3 oz), not currently breastfeeding.Body mass index is 43.15 kg/m.  General: NAD  CV: RRR, no m/r/g   Lungs: CTAB, no crackles or wheezes  Abd: Soft, NT/ND, +BS   Ext:  No edema, DP pulses 2+  Bilateral knees without tenderness or significant effusion, no crepitus bilateral, neg Lachman, negative anterior/posterior drawer, no pain on valgus or varus stress, bilaterally neurovascularly intact     Labs/Imaging:  Albumin   Date Value Ref Range Status    07/06/2017 3.8 3.7 - 5.3 G/DL Final     ALT   Date Value Ref Range Status   07/06/2017 26 7 - 52 U/L Final     AST   Date Value Ref Range Status   07/06/2017 25 13 - 39 U/L Final     BUN   Date Value Ref Range Status   07/06/2017 12 7 - 25 mg/dL Final     Calcium   Date Value Ref Range Status   07/06/2017 9.1 8.6 - 10.3 mg/dL Final     Chloride   Date Value Ref Range Status   07/06/2017 112 (H) 98 - 107 mmol/L Final     Cholesterol   Date Value Ref Range Status   07/06/2017 111 <200 MG/DL Final     Comment:     Desirable: <200 mg/dL  Borderline High: 914-782 mg/dL  High: >956 mg/dL       CO2   Date Value Ref Range Status   07/06/2017 22 21 - 31 mmol/L Final     Creat   Date Value Ref Range Status   07/06/2017 0.9 0.6 - 1.2 mg/dL Final     GFR Non-African American   Date Value Ref Range Status   07/06/2017 >60 >59 Final     GFR African American   Date Value Ref Range Status   07/06/2017 >60 >59 Final     Comment:     (Unit: mL/min/1.73 sq mtr)  The GFR calculation is an estimate and is NOT an accurate reflection  of GFR among patients on dialysis.       Glucose   Date Value Ref Range Status   07/06/2017 123 85 - 125 mg/dL Final     Comment:        Normal Fasting Glucose:  <100 mg/dL  Impaired Fasting Glucose: 100-125 mg/dL  Provisional DX of diabetes(must be confirmed) >125 mg/dL       Hgb   Date Value Ref Range Status   07/06/2017 11.5 11.5 - 15.0 G/DL Final     Glycated Hgb, A1C   Date Value Ref Range Status   07/06/2017 6.6 (H) 4.6 - 5.6 % Final     Comment:     (NOTE)  Reference values for HgA1c:        Non-Diabetic:  4.6 - 5.6%  HbA1c cutoffs for assessing diabetes:      Increased risk for diabetes: 5.7 - 6.4%      Diabetes:  >6.4%  ADA recommended HbA1c goals in treatment of diabetes:      Adults: <7.0%      Children and adolescents with type I Diabetes: <7.5%      Comment: A lower goal of <7.0% is reasonable if it      can be achieved without excessive hypoglycemia.      Goals should be individualized.   Reference: Diabetes Care January 2017.       PLT Count   Date Value Ref Range Status   07/06/2017 254 150 - 400 THOUS/MCL Final     Potassium   Date Value Ref Range Status   07/06/2017 4.3 3.5 - 5.1 mmol/L Final     Sodium   Date Value Ref Range Status   07/06/2017 140 136 - 145 mmol/L Final     Triglycerides   Date Value Ref Range Status   07/06/2017 76 <150 MG/DL Final     Comment:     Normal: <150 mg/dL  Borderline High: 161-096 mg/dL  High: 045-409 mg/dL  Very high: > or = 811 mg/dL         Assessment:  Regina Ward is a 57 year old female with:    1. Arthralgia of both lower legs  Likely OA given history, possibly but less likely tendinopathy  Improved on PT   - continue PT (has completed about 4 sessions per patient)  - acetaminophen (TYLENOL) 500 MG tablet; Take 2 tablets (1,000 mg) by mouth every 8 hours as needed for Moderate Pain (Pain Score 4-6).  Dispense: 30 tablet; Refill: 0  - diclofenac (VOLTAREN) 1 % gel; Apply 1 g topically 4 times daily.  Dispense: 1 Tube; Refill: 0  - X-Ray Knee 3 Views - Bilateral; Future  - Recommend patient to return to clinic for follow-up with PCP.     Suann Larry, MD, MS   Resident Physician, PGY-3  [facility] Department of [facility] Medicine

## 2018-03-04 IMAGING — DX DG CHEST 2V
2 series · 2 of 2 positions shown · non-contrast
Comparison: None.

CLINICAL DATA: Neck stiffness.  Recent carotid surgery.

EXAM:
CHEST  2 VIEW

[chest lat]
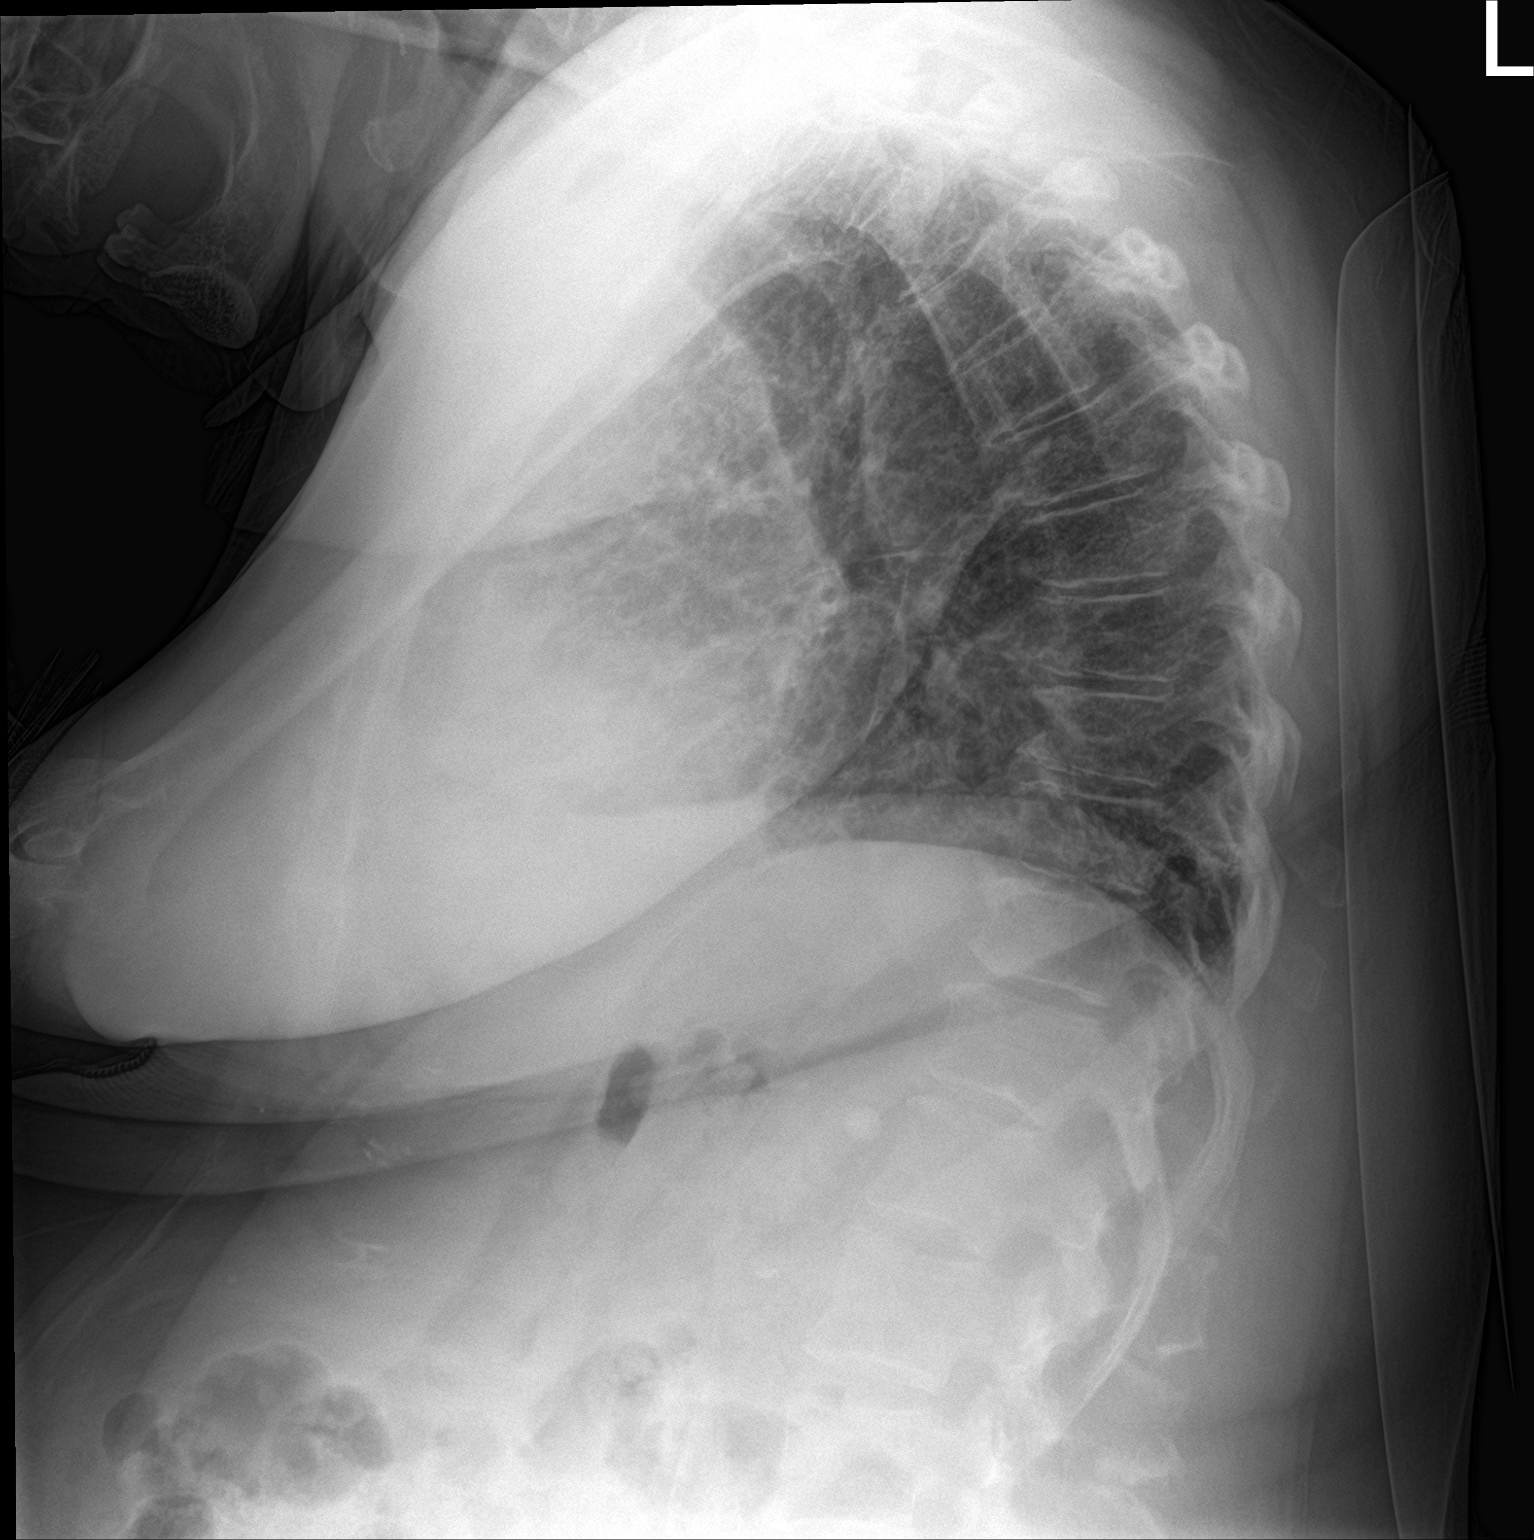

[chest ap]
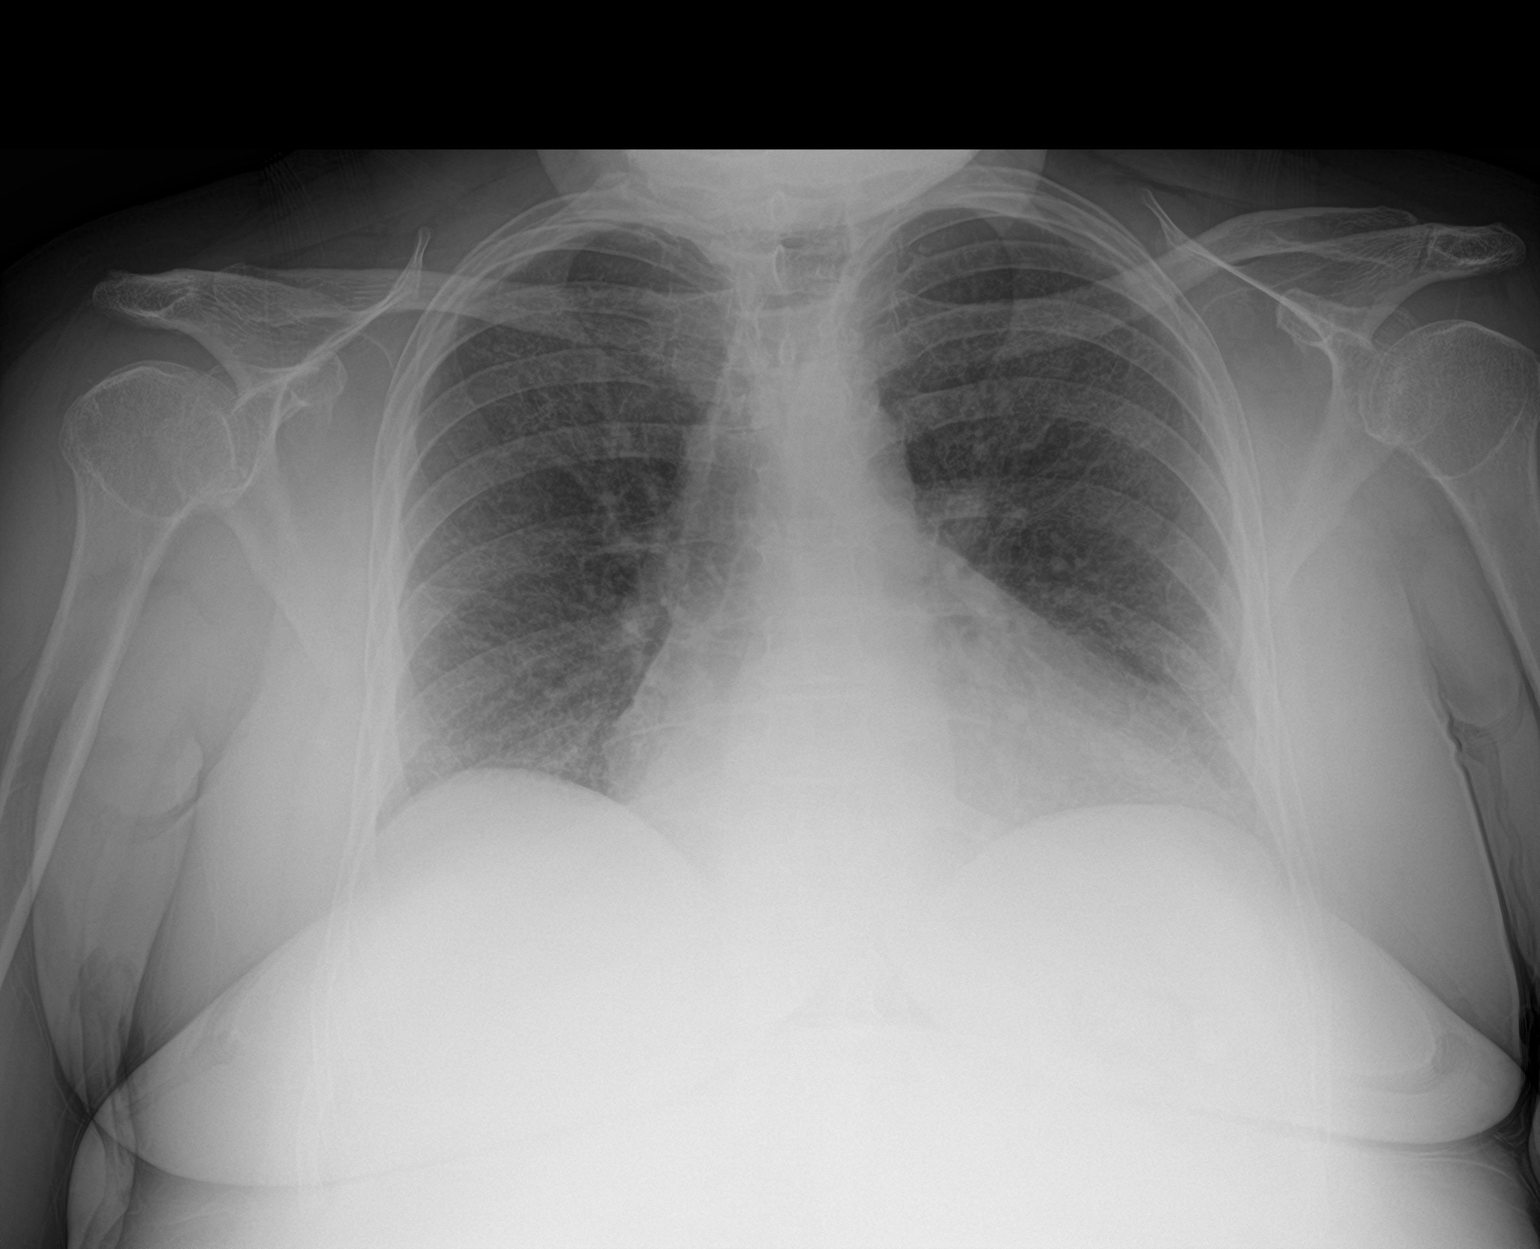

[2 of 2 positions shown; findings below may reference images not displayed]

FINDINGS: Cardiomegaly. No focal infiltrates or significant atelectasis. Low
lung volumes accentuate the pulmonary markings, and there could be
mild vascular congestion without frank edema. No effusion or
pneumothorax. Degenerative change thoracic spine.
IMPRESSION: Cardiomegaly. Query mild vascular congestion without frank edema. No
active disease.

## 2018-03-07 ENCOUNTER — Ambulatory Visit
Payer: No Typology Code available for payment source | Attending: Family Medicine | Admitting: Student in an Organized Health Care Education/Training Program

## 2018-03-07 ENCOUNTER — Encounter: Payer: Self-pay | Admitting: Student in an Organized Health Care Education/Training Program

## 2018-03-07 VITALS — BP 125/79 | HR 72 | Temp 98.9°F | Resp 14 | Wt 238.1 lb

## 2018-03-07 DIAGNOSIS — G8929 Other chronic pain: Secondary | ICD-10-CM | POA: Insufficient documentation

## 2018-03-07 DIAGNOSIS — I6523 Occlusion and stenosis of bilateral carotid arteries: Secondary | ICD-10-CM | POA: Insufficient documentation

## 2018-03-07 DIAGNOSIS — R29898 Other symptoms and signs involving the musculoskeletal system: Secondary | ICD-10-CM | POA: Insufficient documentation

## 2018-03-07 DIAGNOSIS — M25561 Pain in right knee: Secondary | ICD-10-CM | POA: Insufficient documentation

## 2018-03-07 DIAGNOSIS — I63233 Cerebral infarction due to unspecified occlusion or stenosis of bilateral carotid arteries: Secondary | ICD-10-CM | POA: Insufficient documentation

## 2018-03-07 DIAGNOSIS — M25562 Pain in left knee: Secondary | ICD-10-CM | POA: Insufficient documentation

## 2018-03-07 DIAGNOSIS — G2581 Restless legs syndrome: Secondary | ICD-10-CM | POA: Insufficient documentation

## 2018-03-07 MED ORDER — ROPINIROLE HCL 4 MG OR TABS
4.0000 mg | ORAL_TABLET | Freq: Every day | ORAL | 3 refills | Status: DC
Start: 2018-03-07 — End: 2018-09-18

## 2018-03-07 MED ORDER — QUETIAPINE FUMARATE 200 MG OR TABS
200.00 mg | ORAL_TABLET | Freq: Every evening | ORAL | 0 refills | Status: DC
Start: 2018-02-06 — End: 2018-03-07

## 2018-03-07 MED ORDER — QUETIAPINE FUMARATE 100 MG OR TABS
ORAL_TABLET | ORAL | 0 refills | Status: DC
Start: 2018-02-21 — End: 2018-03-07

## 2018-03-07 MED ORDER — NITROGLYCERIN 0.4 MG SL SUBL
0.4000 mg | SUBLINGUAL_TABLET | SUBLINGUAL | 1 refills | Status: DC | PRN
Start: 2018-03-07 — End: 2018-09-18

## 2018-03-07 MED ORDER — CLOPIDOGREL BISULFATE 75 MG OR TABS
75.0000 mg | ORAL_TABLET | Freq: Every day | ORAL | 3 refills | Status: DC
Start: 2018-03-07 — End: 2018-10-24

## 2018-03-07 MED ORDER — HYDROXYZINE HCL 25 MG OR TABS
25.00 mg | ORAL_TABLET | Freq: Three times a day (TID) | ORAL | Status: DC | PRN
Start: ? — End: 2018-09-18

## 2018-03-07 MED ORDER — QUETIAPINE FUMARATE 200 MG OR TABS
200.0000 mg | ORAL_TABLET | Freq: Every evening | ORAL | 2 refills | Status: DC
Start: 2018-03-07 — End: 2018-08-03

## 2018-03-07 MED ORDER — POTASSIUM CHLORIDE CR 10 MEQ OR TBCR
10.0000 meq | EXTENDED_RELEASE_TABLET | Freq: Every day | ORAL | 3 refills | Status: DC
Start: 2018-03-07 — End: 2018-09-18

## 2018-03-07 MED ORDER — ATORVASTATIN CALCIUM 80 MG OR TABS
80.0000 mg | ORAL_TABLET | Freq: Every evening | ORAL | 3 refills | Status: DC
Start: 2018-03-07 — End: 2018-09-18

## 2018-03-07 MED ORDER — ASPIRIN 81 MG OR TBEC
81.0000 mg | DELAYED_RELEASE_TABLET | Freq: Every day | ORAL | 3 refills | Status: DC
Start: 2018-03-07 — End: 2018-09-18

## 2018-03-07 MED ORDER — FUROSEMIDE 20 MG OR TABS
20.0000 mg | ORAL_TABLET | Freq: Every day | ORAL | 3 refills | Status: DC
Start: 2018-03-07 — End: 2018-07-07

## 2018-03-07 MED ORDER — CYCLOBENZAPRINE HCL 10 MG OR TABS
10.0000 mg | ORAL_TABLET | Freq: Three times a day (TID) | ORAL | 0 refills | Status: DC | PRN
Start: 2018-03-07 — End: 2018-09-18

## 2018-03-07 NOTE — Patient Instructions (Signed)
Please call authorization center at  [phone] if you do not hear from them in 1 week regarding your authorization for Physical Therapy and Vascular Surgery

## 2018-03-07 NOTE — Progress Notes (Signed)
ATTENDING BRIEF NOTE [facility] (please see resident note below for full details and direct any correspondence regarding this visit to the treating resident physician)    Regina Ward is a 57 year old female with history of mental illness, COPD, bilateral carotid stenosis s/p R endarterectomy, CVA with residual L sided motor deficits, DMII, insomnia, here today for med refills. Recently discharged from inpatient psych program and needs to resume all meds. Med rec done and appropriate refills sent, also submitted re-referrals to vascular surgery for f/u L carotid stenosis and consider for endarterectomy, and to resume PT for LUE residual weakness since stroke 01/2017. Following with cardiology and pulm. Short term follow up.      Attending attestation: I discussed the case with resident physician Dr Juel BurrowLin and agree with the findings and plan as documented by the resident.  My additions or revisions are included in the record and the plan of care was documented in this note.  I reviewed and signed the resident's note after discussion and additional considerations that now reflect my findings and treatment plan.    Regina GraysShea Suskin, MD

## 2018-03-07 NOTE — Progress Notes (Signed)
Ambulatory History and Physical Examination    March 07, 2018    Language: [place]     CC:   Chief Complaint   Patient presents with   . Refill Request       HPI:  Regina Ward is a 57 year old female who has a past medical history of mental illness (PTSD, bipolar, schizophrenia, anxiety), COPD, bilateral carotid stenosis s/p right thromboenderectomy, CVA with left sided neuro-deficits and chronic insomnia, and diabetes who presents today for follow-up.     #Bipolar, Schizophrenia, Anxiety, PTSD  - Recently had a psychiatric "breakdown" due to her bipolar, sent by her psychiatrist to [facility] inpatient psych facility, discharged 3 weeks ago  - Followed by outside psychiatrist, has regular follow-up  - Taking Seroquel 200mg  qhs which helps her sleep, no longer on Trazodone (psychiatrist asks her to refill her prescription with PCP)  - Mood stable and much Improved.   - no active SI/HI  - Thoughts revolves around not able to reconnect with her children whom she had put up for adoption    #HTN  Blood Pressure   10/25/17 129/68   09/16/17 155/70   08/04/17 135/63     # LUE weakness s/p CVA in 2018   - secondary to high grade bilateral carotid stenosis s/p right CEA 12/2016  - requesting referral to resume therapy with [facility]  - left hand not as strong and mobile as prior to stroke and desires strengthening and increase range of motion.      # Left Carotid Stenosis  - Prior records from her vascular surgeon in [place] re: left carotid stenosis in eChart. She was told by prior surgeon that she will need an left carotid endarterectomy.    - As of 01/2017, right internal carotid with 1-15% diameter reduction in lumen while left internal carotid with 50-79% reduction. Per prior note in 12/2016, left carotid was noted to to have high grade stenosis >90% on CT.       PAST MEDICAL HISTORY:  Patient Active Problem List   Diagnosis   . Insomnia due to other mental disorder   . Bilateral carotid artery stenosis   .  Schizophrenia (CMS-HCC)   . Stroke (cerebrum) (CMS-HCC)   . COPD with asthma (CMS-HCC)   . Depression   . Bipolar 1 disorder (CMS-HCC)   . Restless leg syndrome   . Posttraumatic stress disorder   . Type 2 diabetes mellitus without complication, without long-term current use of insulin (CMS-HCC)   . OSA (obstructive sleep apnea)   . History of uterine cancer       SURGICAL HISTORY:   Past Surgical History:   Procedure Laterality Date   . THROMBOENDARTERECTOMY Right 12/2016    Carotd endarterectomy   . TOTAL ABDOMINAL HYSTERECTOMY W/ BILATERAL SALPINGOOPHORECTOMY  1993       MEDICATIONS:  No outpatient medications have been marked as taking for the 03/07/18 encounter (Appointment) with Wende Crease, MD.       ALLERGIES:  Allergies   Allergen Reactions   . Quetiapine Other       FAMILY HISTORY:  Denies any heart disease in family  Denies any cancers in family    Social History     Socioeconomic History   . Marital status: Single     Spouse name: Not on file   . Number of children: Not on file   . Years of education: Not on file   . Highest education level:  Not on file   Occupational History   . Not on file   Tobacco Use   . Smoking status: Current Every Day Smoker     Packs/day: 1.50     Years: 41.00     Pack years: 61.50     Types: Cigarettes   . Smokeless tobacco: Never Used   Substance and Sexual Activity   . Alcohol use: No   . Drug use: No     Comment: ex drug use 2016 - cocaine   . Sexual activity: Never     Comment: Not sexually active in 3 years   Social Activities of Daily Living Present   . Not on file   Social History Narrative   . Not on file       HEALTH MAINTENANCE:  Most Recent Immunizations   Administered Date(s) Administered   . Influenza Vaccine >=3 Years 07/01/2017       REVIEW OF SYSTEMS:  Denied fever, chills, significant weight change  Denied headache, vision changes  Denied any palpitations  Denied any shortness of breath, cough  Denied any nausea, vomiting, abdominal discomfort, changes in bowel  habits  Denied any dysuria, polyuria    VITALS:  LMP  (LMP Unknown)   There is no height or weight on file to calculate BMI.    Gen: WDWN. A & O x 4. Appears stated age. Is pleasant and cooperative.   CV: S1 and S2 noted. No S3, S4, murmurs or gallops noted. No carotid bruits.  Resp: CTAB. No wheezing or crackles noted.  Abdomen: Soft. Non-distended. Non-TTP. +BS.  Neuro: CN II-XII intact. MS 4/5 in left UE and LE.   Psych: Descently groomed. Good eye contact. Thought process linear and logical. +Racing thoughts. Mood sad. Affect full. No SI/HI. No e/o active psychosis. Limited judgement/insight.       LABS AND IMAGING:  Lab Results   Component Value Date    WBCCOUNT 9.7 07/06/2017    HGB 11.5 07/06/2017    SODIUM 140 07/06/2017    K 4.3 07/06/2017    CO2 22 07/06/2017    BUN 12 07/06/2017    CREAT 0.9 07/06/2017    GLU 123 07/06/2017       Lab Results   Component Value Date    A1C 6.6 (H) 07/06/2017     Lab Results   Component Value Date    ALB 3.8 07/06/2017    ALT 26 07/06/2017    AST 25 07/06/2017    BUN 12 07/06/2017    [place] 9.1 07/06/2017    CL 112 (H) 07/06/2017    CHOL 111 07/06/2017    CO2 22 07/06/2017    CREAT 0.9 07/06/2017    GFR >60 07/06/2017    GFRAA >60 07/06/2017    GLU 123 07/06/2017    HGB 11.5 07/06/2017    A1C 6.6 (H) 07/06/2017    PLT 254 07/06/2017    K 4.3 07/06/2017    SODIUM 140 07/06/2017    TRIG 76 07/06/2017       ASSESSMENT/PLAN:    1. Schizophrenia, unspecified type (CMS-HCC)  2. Bipolar 1 disorder (CMS-HCC)  Stable.[facility] Now under management of outside psychiatrist and also has scheduled counseling. No SI/HI.   - Obtain records from mental health professional  - Management per outside psychiatrist  - Support and counseling for depressed mood given today  - [place] guided meditation web site given to enhance coping skills  - Manage insomnia  to get better sleep quality  - Meds refilled today    3. Bilateral carotid artery stenosis  Per patient s/p R  carotid thromboendarterectomy but still need L CEA. Asymptomatic. Prior vascular surgery records now in Epic.   - Vascular Surgery referral for L CEA (approved, info given to pt to call)  - Continue ASA, clopidogrel, atorvastatin (meds refilled today)  - Counseled on importance of smoke cessation    4. LUE/LLE Weakness s/p Cerebrovascular accident (CVA) due to bilateral stenosis of carotid arteries (CMS-HCC)  Likely 2/2 carotid stenosis vs vascular pathology in setting of long standing smoking history and morbid obesity. With residual Left sided weakness and neurological deficits.   - OT referral for LUE/hand ROM and mobility (already has BLE for knee pain)  - Cont current med regimen  - Counseled on keeping active      RTC Return in about 4 weeks (around 04/04/2018) for f/u with Dr. Juel Burrow .     No future appointments.    Patient was given strict return precautions should symptoms worsen or fail to improve for any concerns.    Orders Placed This Encounter   Procedures   . Consult to [facility]   . Consult to Physical Therapy - External

## 2018-03-08 ENCOUNTER — Telehealth: Payer: Self-pay | Admitting: Student in an Organized Health Care Education/Training Program

## 2018-03-08 ENCOUNTER — Encounter: Payer: Self-pay | Admitting: Student in an Organized Health Care Education/Training Program

## 2018-03-08 NOTE — Telephone Encounter (Signed)
Ha from [employer] calling in regards to Care Plan faxed on 03/03/18 she is asking if received, if Dr can sign fax back to then it is due on 03/25/18 and their fax # is [phone]. Please further assist

## 2018-03-10 ENCOUNTER — Telehealth: Payer: Self-pay | Admitting: Student in an Organized Health Care Education/Training Program

## 2018-03-10 NOTE — Telephone Encounter (Signed)
Patient is calling to book a follow up visit with doctor Juel BurrowLin but her appointments are not open. I offer another doctor but she decline. Please assist. Thank you.

## 2018-03-13 NOTE — Telephone Encounter (Signed)
2nd call, patient seeking an appointment with Dr. Juel BurrowLin in July, Dr. Juel BurrowLin dates are not posted yet. She also mentioned she needs refills on potassium and PLAVIX. Please follow up, thank you.

## 2018-03-14 NOTE — Telephone Encounter (Signed)
LVM letting patient know that Dr Juel Burrowlin, linda doesn't have anything open until July 1 and she more and welcome to call back and make apt.

## 2018-03-14 NOTE — Telephone Encounter (Signed)
Pt has refills for plavix and potassium since 03/07/2018  Called pt n/a

## 2018-03-14 NOTE — Telephone Encounter (Signed)
2nd call. See previous message. Gaylyn RongHa is calling to confirm we received the Care Plan. I let her know what Byrd HesselbachMaria from the clinic updated and she is ok. Just wants us to know it is due no later than 06-21 with a signature and faxed back to her. Can you please assist, thank you.

## 2018-03-14 NOTE — Telephone Encounter (Signed)
paln of care received and placed in Dr. Truman HaywardLin's box

## 2018-03-17 ENCOUNTER — Encounter: Payer: Self-pay | Admitting: Student in an Organized Health Care Education/Training Program

## 2018-03-17 NOTE — Progress Notes (Signed)
[employer] Care Plan  Case Manager: Louisa SecondMaria Yang    Issues:  1. Has been told in the past 3 months to a surgery and have not done it; Block veins in neck. Authorization in placed for Vascular Surgeon.  2. Problems filling prescriptions.  3. Health problems that are getting worse: memory loss, sleep/insomnia  (patient may call [phone] to have memory assessment completed)  4. Afraid of falling but no reported fall  5. Problems with teeth. Provided with [employer] information  6. Pain level 4-6/10. PCP addressed the member's c/o bilateral knees pain on 10/25/17.  7. Needs hearing aids  8. Sometimes runs out of money to pay for food, rent, bills and medicine. Mailed resources.  9. (+) Personal threat.  10. Smokes and wants help to quit.  11. Memory problems.     Recommendations:  1) Address memory loss/sleep/insomnia  2) Address screenings for Cholesterol, Colon [place], Cervical [place], Breast [place], Lung [place] (low dose CT chest), Hep C

## 2018-03-18 NOTE — Telephone Encounter (Signed)
Care plan received and signed. [facility] to be [facility]. // Dr. Juel BurrowLin

## 2018-03-20 ENCOUNTER — Telehealth: Payer: Self-pay | Admitting: Student in an Organized Health Care Education/Training Program

## 2018-03-20 NOTE — Telephone Encounter (Signed)
3rd call please see previus messages from 03/08/2018. Ha from [employer] is calling to follow up on care plan. I phone utilized and left a voice mail.

## 2018-03-21 NOTE — Telephone Encounter (Signed)
FORWARD TO BACK OFFICE

## 2018-03-21 NOTE — Telephone Encounter (Signed)
Care plan faxed, confirmation received. Notified Ha at [employer]. No further action needed.

## 2018-03-22 NOTE — Telephone Encounter (Signed)
Forms faxed today   Fax confirmation received

## 2018-04-07 ENCOUNTER — Ambulatory Visit
Payer: No Typology Code available for payment source | Attending: Family Medicine | Admitting: Student in an Organized Health Care Education/Training Program

## 2018-04-07 ENCOUNTER — Encounter: Payer: Self-pay | Admitting: Student in an Organized Health Care Education/Training Program

## 2018-04-07 VITALS — BP 128/65 | HR 78 | Temp 98.0°F | Resp 18 | Ht 62.0 in | Wt 229.3 lb

## 2018-04-07 DIAGNOSIS — F17200 Nicotine dependence, unspecified, uncomplicated: Secondary | ICD-10-CM | POA: Insufficient documentation

## 2018-04-07 DIAGNOSIS — M25562 Pain in left knee: Secondary | ICD-10-CM | POA: Insufficient documentation

## 2018-04-07 DIAGNOSIS — I6523 Occlusion and stenosis of bilateral carotid arteries: Secondary | ICD-10-CM | POA: Insufficient documentation

## 2018-04-07 DIAGNOSIS — E119 Type 2 diabetes mellitus without complications: Secondary | ICD-10-CM | POA: Insufficient documentation

## 2018-04-07 DIAGNOSIS — M25561 Pain in right knee: Secondary | ICD-10-CM | POA: Insufficient documentation

## 2018-04-07 DIAGNOSIS — I63233 Cerebral infarction due to unspecified occlusion or stenosis of bilateral carotid arteries: Secondary | ICD-10-CM | POA: Insufficient documentation

## 2018-04-07 DIAGNOSIS — Z0000 Encounter for general adult medical examination without abnormal findings: Secondary | ICD-10-CM | POA: Insufficient documentation

## 2018-04-07 LAB — LIPID(CHOL FRACT) PANEL, BLOOD
Cholesterol: 105 MG/DL (ref <200)
HDL Cholesterol: 37 MG/DL — ABNORMAL LOW (ref >40)
LDL Cholesterol (calc): 51 MG/DL (ref <160)
Non HDL Cholesterol (calculated): 68 MG/DL (ref <130)
Triglycerides: 83 MG/DL (ref <150)
VLDL Cholesterol (calculated): 17 MG/DL

## 2018-04-07 LAB — CBC WITH DIFF, BLOOD
ANC automated: 5 10*3/uL (ref 2.0–8.1)
Basophils %: 0.5 %
Basophils Absolute: 0 10*3/uL (ref 0.0–0.2)
Eosinophils %: 1.2 %
Eosinophils Absolute: 0.1 10*3/uL (ref 0.0–0.5)
Hematocrit: 37.1 % (ref 34.0–44.0)
Hgb: 12.5 G/DL (ref 11.5–15.0)
Lymphocytes %: 35.9 %
Lymphocytes Absolute: 3.3 10*3/uL (ref 0.9–3.3)
MCH: 29.5 PG (ref 27.0–33.5)
MCHC: 33.8 G/DL (ref 32.0–35.5)
MCV: 87.4 FL (ref 81.5–97.0)
MPV: 9.5 FL (ref 7.2–11.7)
Monocytes %: 8.2 %
Monocytes Absolute: 0.8 10*3/uL (ref 0.0–0.8)
Neutrophils % (A): 54.2 %
PLT Count: 212 10*3/uL (ref 150–400)
RBC: 4.25 10*6/uL (ref 3.70–5.00)
RDW-CV: 16.1 % — ABNORMAL HIGH (ref 11.6–14.4)
White Bld Cell Count: 9.2 10*3/uL (ref 4.0–10.5)

## 2018-04-07 LAB — COMPREHENSIVE METABOLIC PANEL, BLOOD
ALT: 39 U/L (ref 7–52)
AST: 34 U/L (ref 13–39)
Albumin: 4.3 G/DL (ref 3.7–5.3)
Alk Phos: 66 U/L (ref 34–104)
BUN: 12 mg/dL (ref 7–25)
Bilirubin, Total: 0.3 mg/dL (ref 0.0–1.4)
CO2: 28 mmol/L (ref 21–31)
Calcium: 9.3 mg/dL (ref 8.6–10.3)
Chloride: 107 mmol/L (ref 98–107)
Creat: 0.9 mg/dL (ref 0.6–1.2)
Electrolyte Balance: 6 mmol/L (ref 2–12)
Glucose: 161 mg/dL — ABNORMAL HIGH (ref 85–125)
Potassium: 3.7 mmol/L (ref 3.5–5.1)
Protein, Total: 7.3 G/DL (ref 6.0–8.3)
Sodium: 141 mmol/L (ref 136–145)
eGFR - high estimate: >60 (ref >59)
eGFR - low estimate: >60 (ref >59)

## 2018-04-07 LAB — FOLIC ACID, BLOOD: Folate, Srm: 37 NG/ML (ref >5.9)

## 2018-04-07 LAB — VITAMIN B12, BLOOD: Vitamin B12 Lvl: 453 PG/ML (ref 180–1241)

## 2018-04-07 LAB — THYROID CASCADE: TSH, Ultrasensitive: 1.663 u[IU]/mL (ref 0.450–4.120)

## 2018-04-07 LAB — RANDOM URINE MICROALB/CREAT RATIO PANEL
Creatinine, G/L: 4 g/L
Microalbumin/Creatinine Ratio: 4.5 MG MICAL/GM CRE
Microalbumin: 18 MG/L

## 2018-04-07 LAB — VITAMIN D, 25-OH TOTAL: Vitamin D, 25-Hydroxy, Total: 39.9 ng/mL (ref >30.0)

## 2018-04-07 MED ORDER — NICOTINE 14 MG/24HR TD PT24
MEDICATED_PATCH | TRANSDERMAL | 0 refills | Status: DC
Start: 2018-04-07 — End: 2018-08-17

## 2018-04-07 MED ORDER — DICLOFENAC SODIUM 1 % EX GEL
1.0000 g | Freq: Four times a day (QID) | TRANSDERMAL | 1 refills | Status: DC
Start: 2018-04-07 — End: 2019-02-15

## 2018-04-07 MED ORDER — NICOTINE 7 MG/24HR TD PT24
MEDICATED_PATCH | TRANSDERMAL | 0 refills | Status: DC
Start: 2018-04-07 — End: 2018-08-17

## 2018-04-07 MED ORDER — NICOTINE 21 MG/24HR TD PT24
MEDICATED_PATCH | TRANSDERMAL | 0 refills | Status: DC
Start: 2018-04-07 — End: 2018-08-17

## 2018-04-07 NOTE — Progress Notes (Signed)
Ambulatory History and Physical Examination    April 07, 2018    Language: [place]     CC:   Chief Complaint   Patient presents with   . 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     #HTN - controlled, deferred to next visit  Blood Pressure   04/07/18 128/65   03/07/18 125/79   02/01/18 133/78       # LUE weakness s/p CVA in 2018   - secondary to high grade bilateral carotid stenosis s/p right CEA 12/2016  - has not received referral information to resume therapy with [facility]  - left hand not as strong and mobile as prior to stroke and desires strengthening and increase range of motion.      # Left Carotid Stenosis  - Prior records from her vascular surgeon in [place] re: left carotid stenosis in eChart. She was told by prior surgeon that she will need an left carotid endarterectomy.    - As of 01/2017, right internal carotid with 1-15% diameter reduction in lumen while left internal carotid with 50-79% reduction. Per prior note in 12/2016, left carotid was noted to to have high grade stenosis >90% on CT.   - Called vascular surgeon number given but was told that she needs a referral, referral submitted & approved in our system.   - Of note, pt has difficulty navigating healthcare system on her own, used to have her cousin's help but not any more.     # Tobacco Use D/o  - Heavy smoker for 40 years  - Highly motivated to quit, was smoking 1+ pack/day now down to 1/2 pack a day  - Ready to start nicotine patch program      PAST MEDICAL HISTORY:  Patient Active Problem List   Diagnosis   . Insomnia due to other mental disorder   . Bilateral carotid artery stenosis   . Schizophrenia (CMS-HCC)   . Stroke (cerebrum)  (CMS-HCC)   . COPD with asthma (CMS-HCC)   . Depression   . Bipolar 1 disorder (CMS-HCC)   . Restless leg syndrome   . Posttraumatic stress disorder   . Type 2 diabetes mellitus without complication, without long-term current use of insulin (CMS-HCC)   . OSA (obstructive sleep apnea)   . History of uterine cancer       SURGICAL HISTORY:   Past Surgical History:   Procedure Laterality Date   . THROMBOENDARTERECTOMY Right 12/2016    Carotd endarterectomy   . TOTAL ABDOMINAL HYSTERECTOMY W/ BILATERAL SALPINGOOPHORECTOMY  1993       MEDICATIONS:  Outpatient Medications Marked as Taking for the 04/07/18 encounter (Office Visit) with Wende Crease, MD   Medication Sig Dispense Refill   . acetaminophen (TYLENOL) 500 MG tablet Take 2 tablets (1,000 mg) by mouth every 8 hours as needed for Moderate Pain (Pain Score 4-6). 30 tablet 0   . aspirin 81 MG EC tablet Take 1 tablet (81 mg) by mouth daily. 60 tablet 3   . atorvastatin (LIPITOR) 80 MG tablet Take 1 tablet (80 mg) by mouth nightly. 60 tablet 3   . clopidogrel (PLAVIX) 75 MG tablet Take 1 tablet (  75 mg) by mouth daily. 60 tablet 3   . diclofenac (VOLTAREN) 1 % gel Apply 1 g topically 4 times daily. 1 Tube 0   . furosemide (LASIX) 20 MG tablet Take 1 tablet (20 mg) by mouth daily. 60 tablet 3   . hydrOXYzine HCL (ATARAX) 25 MG tablet Take 25 mg by mouth 3 times daily as needed for Itching.     Marland Kitchen ibuprofen (MOTRIN) 600 MG tablet take 1 tablet by mouth every 8 hours 7 days  0   . nitroGLYcerin (NITROSTAT) 0.4 MG SL tablet 1 tablet (0.4 mg) by Sublingual route every 5 minutes as needed for Chest Pain. up to 3 tabs per episode. 1 bottle 1   . potassium chloride (K-DUR) 10 MEQ Sustained-Release tablet Take 1 tablet (10 mEq) by mouth daily. 60 tablet 3   . QUEtiapine (SEROQUEL) 200 MG tablet Take 1 tablet (200 mg) by mouth at bedtime. 60 tablet 2   . ropinirole (REQUIP) 4 MG tablet Take 1 tablet (4 mg) by mouth daily. 60 tablet 3       ALLERGIES:  Allergies   Allergen Reactions   .  Quetiapine Other       FAMILY HISTORY:  Denies any heart disease in family  Denies any cancers in family    Social History     Socioeconomic History   . Marital status: Single     Spouse name: Not on file   . Number of children: Not on file   . Years of education: Not on file   . Highest education level: Not on file   Occupational History   . Not on file   Tobacco Use   . Smoking status: Current Every Day Smoker     Packs/day: 1.50     Years: 41.00     Pack years: 61.50     Types: Cigarettes   . Smokeless tobacco: Never Used   Substance and Sexual Activity   . Alcohol use: No   . Drug use: No     Comment: ex drug use 2016 - cocaine   . Sexual activity: Never     Comment: Not sexually active in 3 years   Social Activities of Daily Living Present   . Not on file   Social History Narrative   . Not on file       HEALTH MAINTENANCE:  Most Recent Immunizations   Administered Date(s) Administered   . Influenza Vaccine >=3 Years 07/01/2017       REVIEW OF SYSTEMS:  Denied fever, chills, significant weight change  Denied headache, vision changes  Denied any palpitations  Denied any shortness of breath, cough  Denied any nausea, vomiting, abdominal discomfort, changes in bowel habits  Denied any dysuria, polyuria    VITALS:  BP 128/65 (BP Location: Left arm, BP Patient Position: Sitting, BP cuff size: Regular)   Pulse 78   Temp 98 F (36.7 C) (Oral)   Resp 18   Ht 5\' 2"  (1.575 m)   Wt 104 kg (229 lb 4.5 oz)   LMP  (LMP Unknown)   BMI 41.94 kg/m   Body mass index is 41.94 kg/m.    Gen: WDWN. A & O x 4. Appears stated age. Is pleasant and cooperative.   CV: S1 and S2 noted. No S3, S4, murmurs or gallops noted. No carotid bruits.  Resp: CTAB. No wheezing or crackles noted.  Abdomen: Soft. Non-distended. Non-TTP. +BS.  Neuro: CN II-XII intact. MS 4/5 in left  UE and LE.   Psych: Descently groomed. Good eye contact. Thought process linear and logical. +Racing thoughts. Mood sad. Affect full. No SI/HI. No e/o active  psychosis. Limited judgement/insight.       LABS AND IMAGING:  Lab Results   Component Value Date    WBCCOUNT 9.7 07/06/2017    HGB 11.5 07/06/2017    SODIUM 140 07/06/2017    K 4.3 07/06/2017    CO2 22 07/06/2017    BUN 12 07/06/2017    CREAT 0.9 07/06/2017    GLU 123 07/06/2017       Lab Results   Component Value Date    A1C 6.6 (H) 07/06/2017     Lab Results   Component Value Date    ALB 3.8 07/06/2017    ALT 26 07/06/2017    AST 25 07/06/2017    BUN 12 07/06/2017    [place] 9.1 07/06/2017    CL 112 (H) 07/06/2017    CHOL 111 07/06/2017    CO2 22 07/06/2017    CREAT 0.9 07/06/2017    GFR >60 07/06/2017    GFRAA >60 07/06/2017    GLU 123 07/06/2017    HGB 11.5 07/06/2017    A1C 6.6 (H) 07/06/2017    PLT 254 07/06/2017    K 4.3 07/06/2017    SODIUM 140 07/06/2017    TRIG 76 07/06/2017       ASSESSMENT/PLAN:    1. Bilateral carotid artery stenosis  Per patient s/p R carotid thromboendarterectomy but still need L CEA. Asymptomatic. Prior vascular surgery records now in Epic.   - Vascular Surgery referral for L CEA (approved, info given to pt to call)  - Continue ASA, clopidogrel, atorvastatin  - Counseled on importance of smoke cessation    2. LUE/LLE Weakness s/p Cerebrovascular accident (CVA) due to bilateral stenosis of carotid arteries (CMS-HCC)  Likely 2/2 carotid stenosis vs vascular pathology in setting of long standing smoking history and morbid obesity. With residual Left sided weakness and neurological deficits.   - OT referral for LUE/hand ROM and mobility  - Cont current med regimen  - Counseled on keeping active    3. Tobacco Use Disorder  Motivated. Contemplative. Ready to quit.   - Start Nicotine patches  - Close f/u  - Will give Smokefree.gov website information next visit    4. Arthralgia of both lower legs  - Refilled Diclofenac gel  - Will discuss referral to orthopedic surgery for bilateral knee XR findings next visit    5. Restless Leg Syndrome  - Check for iron deficiency (iron panel) on next  visit    6. T2DM  - Check A1c, Lipids, CMP      RTC Return in about 4 weeks (around 05/05/2018) for f/u with DR. Juel BurrowLin; earliest appt with Dr. Juel BurrowLin for WWE.     Future Appointments   Date Time Provider Department Center   04/14/2018  9:40 AM Wende CreaseLin[facility] [place]   05/08/2018  8:40 AM [facility]ende CreaseLin, [facility] Mudry, [facility] [place]     Patient was given strict return precautions should symptoms worsen or fail to improve for any concerns.    Orders Placed This Encounter   Procedures   . Comprehensive Metabolic Panel - See Instructions   . Glycosylated Hgb(A1C), Blood Lavender   . Lipid Panel Green Plasma Separator Tube   . Random Urine Microalb/Creat Ratio Panel   . Vitamin D, 25-Hydroxy, Blood Yellow serum separator tube   .  Thyroid Cascade   . CBC w/ Diff Lavender   . Vitamin B12, Blood Green Plasma Separator Tube   . Folic Acid, Blood Yellow serum separator tube

## 2018-04-07 NOTE — Patient Instructions (Addendum)
Vascular Surgery Referral     "[facility] modified  efaxed req notes auth to dr timothy harward [phone], phone [phone]  Information mailed to the patient"    Please call authorization center at  [phone] if you do not hear from them in 1 week regarding your authorization.        Physical Therapy Referral     [facility] AT [phone]       Nicotine Patches     Please pick up patches at your pharmacy.     Nicotine skin patches  What is this medicine?  NICOTINE (NIK [place] teen) helps people stop smoking. The patches replace the nicotine found in cigarettes and help to decrease withdrawal effects. They are most effective when used in combination with a stop-smoking program.  This medicine may be used for other purposes; ask your health care provider or pharmacist if you have questions.  COMMON BRAND NAME(S): Habitrol, Nicoderm CQ, Nicotrol  What should I tell my health care provider before I take this medicine?  They need to know if you have any of these conditions:  -diabetes  -heart disease, angina, irregular heartbeat or previous heart attack  -high blood pressure  -lung disease, including asthma  -overactive thyroid  -pheochromocytoma  -seizures or a history of seizures  -skin problems, like eczema  -stomach problems or ulcers  -an unusual or allergic reaction to nicotine, adhesives, other medicines, foods, dyes, or preservatives  -pregnant or trying to get pregnant  -breast-feeding  How should I use this medicine?  This medicine is for use on the skin. Follow the directions that come with the patches. Find an area of skin on your upper arm, chest, or back that is clean, dry, greaseless, undamaged and hairless. Wash hands with plain soap and water. Do not use anything that contains aloe, lanolin or glycerin as these may prevent the patch from sticking. Dry thoroughly. Remove the patch from the sealed pouch. Do not try to cut or trim the patch. Using your palm, press the patch firmly in place for 10  seconds to make sure that there is good contact with your skin. After applying the patch, wash your hands. Change the patch every day, keeping to a regular schedule. When you apply a new patch, use a new area of skin. Wait at least 1 week before using the same area again.  Talk to your pediatrician regarding the use of this medicine in children. Special care may be needed.  Overdosage: If you think you have taken too much of this medicine contact a poison control center or emergency room at once.  NOTE: This medicine is only for you. Do not share this medicine with others.  What if I miss a dose?  If you forget to replace a patch, use it as soon as you can. Only use one patch at a time and do not leave on the skin for longer than directed. If a patch falls off, you can replace it, but keep to your schedule and remove the patch at the right time.  What may interact with this medicine?  -medicines for asthma  -medicines for blood pressure  -medicines for mental depression  This list may not describe all possible interactions. Give your health care provider a list of all the medicines, herbs, non-prescription drugs, or dietary supplements you use. Also tell them if you smoke, drink alcohol, or use illegal drugs. Some items may interact with your medicine.  What should I watch for while  using this medicine?  You should begin using the nicotine patch the day you stop smoking. It is okay if you do not succeed at your attempt to quit and have a cigarette. You can still continue your quit attempt and keep using the product as directed. Just throw away your cigarettes and get back to your quit plan.  You can keep the patch in place during swimming, bathing, and showering. If your patch falls off during these activities, replace it.  When you first apply the patch, your skin may itch or burn. This should go away soon. When you remove a patch, the skin may look red, but this should only last for a few days. Call your doctor or  health care professional if skin redness does not go away after 4 days, if your skin swells, or if you get a rash.  If you are a diabetic and you quit smoking, the effects of insulin may be increased and you may need to reduce your insulin dose. Check with your doctor or health care professional about how you should adjust your insulin dose.  If you are going to have a magnetic resonance imaging (MRI) procedure, tell your MRI technician if you have this patch on your body. It must be removed before a MRI.  What side effects may I notice from receiving this medicine?  Side effects that you should report to your doctor or health care professional as soon as possible:  -allergic reactions like skin rash, itching or hives, swelling of the face, lips, or tongue  -breathing problems  -changes in hearing  -changes in vision  -chest pain  -cold sweats  -confusion  -fast, irregular heartbeat  -feeling faint or lightheaded, falls  -headache  -increased saliva  -skin redness that lasts more than 4 days  -stomach pain  -signs and symptoms of nicotine overdose like nausea; vomiting; dizziness; weakness; and rapid heartbeat  Side effects that usually do not require medical attention (report to your doctor or health care professional if they continue or are bothersome):  -diarrhea  -dry mouth  -hiccups  -irritability  -nervousness or restlessness  -trouble sleeping or vivid dreams  This list may not describe all possible side effects. Call your doctor for medical advice about side effects. You may report side effects to FDA at 1-800-FDA-1088.  Where should I keep my medicine?  Keep out of the reach of children.  Store at room temperature between 20 and 25 degrees C (68 and 77 degrees F). Protect from heat and light. Store in Tax inspector until ready to use. Throw away unused medicine after the expiration date. When you remove a patch, fold with sticky sides together; put in an empty opened pouch and throw away.  NOTE:  This sheet is a summary. It may not cover all possible information. If you have questions about this medicine, talk to your doctor, pharmacist, or health care provider.   2018 Elsevier/Gold Standard (2014-08-19 15:46:21)

## 2018-04-07 NOTE — Progress Notes (Signed)
    Attending Attestation: I reviewed the key and critical portions of the history and physical as presented by the resident physician. Agree with the above documentation regarding this patient. Patient was discussed with the resident physician and the assessment and plan was then jointly formulated . My additions or revision are included in the record.       Subjective: Please see resident note for further details, but briefly, Regina Ward is a 57 year old female with a past medical history of mental illness (PTSD, bipolar, schizophrenia, anxiety), COPD, bilateral carotid stenosis s/p right thromboenderectomy, CVA with left sided neuro-deficits and chronic insomnia, and diabetes who presents today for follow-up. Here for f/u on referrals.      Objective: BP 128/65 (BP Location: Left arm, BP Patient Position: Sitting, BP cuff size: Regular)   Pulse 78   Temp 98 F (36.7 C) (Oral)   Resp 18   Ht 5\' 2"  (1.575 m)   Wt 104 kg (229 lb 4.5 oz)   LMP  (LMP Unknown)   BMI 41.94 kg/m    Body mass index is 41.94 kg/m.  is as per resident physician. Relevant details below:  L sided weakness ( chronic after CVA)    Assessment/Plan: Agree with resident's assessment and plan.    Vascular surgery referral approved. Pt to make appt  Smoking cessation, start Nicotine patch, close f/u      Patient was given strict return precautions should symptoms worsen or fail to improve or any concerns.

## 2018-04-08 LAB — GLYCOSYLATED HGB(A1C), BLOOD: Glycated Hgb, A1C: 8.8 % — ABNORMAL HIGH (ref 4.6–5.6)

## 2018-04-14 ENCOUNTER — Encounter: Payer: Self-pay | Admitting: Student in an Organized Health Care Education/Training Program

## 2018-04-14 ENCOUNTER — Ambulatory Visit: Payer: Self-pay | Attending: Pathology | Admitting: Student in an Organized Health Care Education/Training Program

## 2018-04-14 VITALS — BP 110/65 | HR 83 | Temp 97.8°F | Resp 19 | Ht 62.01 in | Wt 231.5 lb

## 2018-04-14 DIAGNOSIS — I6522 Occlusion and stenosis of left carotid artery: Secondary | ICD-10-CM | POA: Insufficient documentation

## 2018-04-14 DIAGNOSIS — Z23 Encounter for immunization: Secondary | ICD-10-CM | POA: Insufficient documentation

## 2018-04-14 DIAGNOSIS — F17200 Nicotine dependence, unspecified, uncomplicated: Secondary | ICD-10-CM | POA: Insufficient documentation

## 2018-04-14 DIAGNOSIS — Z1331 Encounter for screening for depression: Secondary | ICD-10-CM | POA: Insufficient documentation

## 2018-04-14 DIAGNOSIS — Z01419 Encounter for gynecological examination (general) (routine) without abnormal findings: Secondary | ICD-10-CM | POA: Insufficient documentation

## 2018-04-14 DIAGNOSIS — B372 Candidiasis of skin and nail: Secondary | ICD-10-CM | POA: Insufficient documentation

## 2018-04-14 MED ORDER — NYSTATIN 100000 UNIT/GM EX CREA
TOPICAL_CREAM | CUTANEOUS | 2 refills | Status: DC
Start: 2018-04-14 — End: 2018-07-07

## 2018-04-14 NOTE — Progress Notes (Signed)
Attending Attestation: I reviewed the key and critical portions of the history and physical as presented by the resident physician. Agree with the above documentation regarding this patient. Patient was discussed with the resident physician and the assessment and plan was then jointly formulated . My additions or revision are included in the record.       Subjective: Please see resident note for further details, but briefly, Regina Ward is a 57 year old female here for WWE. H/o cervical cancer, s/p hysterectomy 1993. Last pap was neg per pt.      Objective: BP 110/65 (BP Location: Left arm, BP Patient Position: Sitting, BP cuff size: Regular)   Pulse 83   Temp 97.8 F (36.6 C) (Oral)   Resp 19   Ht 5' 2.01" (1.575 m)   Wt 105 kg (231 lb 7.7 oz)   LMP  (LMP Unknown)   BMI 42.33 kg/m    Body mass index is 42.33 kg/m.  is as per resident physician. Relevant details below:   No cervix on pelvic exam. No palpable pelvic mass.        Assessment/Plan: Agree with resident's assessment and plan.    Pap done, [facility] referral.  HIV, Hep C check  Nystatin cream for poss fungal infection under her panus.  RTC after pap      Patient was given strict return precautions should symptoms worsen or fail to improve or any concerns.

## 2018-04-14 NOTE — Progress Notes (Signed)
Family Medicine WWE    Wende Crease, MD  [place] Department of Family Medicine Resident   [facility]     Patient: Regina Ward  DOB: 1961-02-26  Primary Care Provider: Wende Crease  Date of Service:  04/14/2018      Subjective:      Complaint: No chief complaint on file.        Patientis a 57 year old female, G8P6 here for WWE    Concerns for today:    Overall doing well today.   # Carotid stenosis - Need urgent referral, prior referral expired    # Smoking Cessation - Picked up Step 1 patches, will start today  # Candidasis intertrigo - Noticed red and inflamed rash under stomach skin fold. tried vaseline which made it worse.     - Sexual partners: male, no sexual activities for past 3 years  - Had h/o STD (Gonorrhea)  - BCM: never   - Last Pap smear: normal, 2017  - Most recent mammogram: 2018, normal  - LMP in 1993, s/p hysterectomy for cervical cancer    HCM:  - TdaP due today  - PPSV23 2018  - HepC ?       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 Insomnia due to other mental disorder   . Bilateral carotid artery stenosis   . Schizophrenia (CMS-HCC)   . Stroke (cerebrum) (CMS-HCC)   . COPD with asthma (CMS-HCC)   . Depression   . Bipolar 1 disorder (CMS-HCC)   .  Restless leg syndrome   . Posttraumatic stress disorder   . Type 2 diabetes mellitus without complication, without long-term current use of insulin (CMS-HCC)   . OSA (obstructive sleep apnea)   . History of uterine cancer       Past Surgical History:  Past Surgical History:   Procedure Laterality Date   . THROMBOENDARTERECTOMY Right 12/2016    Carotd endarterectomy   . TOTAL ABDOMINAL HYSTERECTOMY W/ BILATERAL SALPINGOOPHORECTOMY  1993        Medications:  Current Outpatient Medications   Medication Sig Dispense Refill   . acetaminophen (TYLENOL) 500 MG tablet Take 2 tablets (1,000 mg) by mouth every 8 hours as needed for Moderate Pain (Pain Score 4-6). 30 tablet 0   . aspirin 81 MG EC tablet Take 1 tablet (81 mg) by mouth daily. 60 tablet 3   . atorvastatin (LIPITOR) 80 MG tablet Take 1 tablet (80 mg) by mouth nightly.  60 tablet 3   . clopidogrel (PLAVIX) 75 MG tablet Take 1 tablet (75 mg) by mouth daily. 60 tablet 3   . cyclobenzaprine (FLEXERIL) 10 MG tablet Take 1 tablet (10 mg) by mouth every 8 hours as needed for Muscle Spasms. 60 tablet 0   . diclofenac (VOLTAREN) 1 % gel Apply 1 g topically 4 times daily. 1 Tube 1   . furosemide (LASIX) 20 MG tablet Take 1 tablet (20 mg) by mouth daily. 60 tablet 3   . hydrOXYzine HCL (ATARAX) 25 MG tablet Take 25 mg by mouth 3 times daily as needed for Itching.     Marland Kitchen. ibuprofen (MOTRIN) 600 MG tablet take 1 tablet by mouth every 8 hours 7 days  0   . nicotine (NICOTINE STEP 1) 21 MG/24HR patch Step 1: apply 1 patch on skin every 24 hours for weeks 1-6 after stop smoking 42 patch 0   . nicotine (NICOTINE STEP 2) 14 MG/24HR Step 2: apply 1 patch on skin every 24 hours for weeks 7-8 after stop smoking 14 patch 0   . nicotine (NICOTINE STEP 3) 7 MG/24HR Step 3: apply 1 patch on skin every 24 hours for weeks 9-10 after stop smoking 14 patch 0   . nitroGLYcerin (NITROSTAT) 0.4 MG SL tablet 1 tablet (0.4 mg) by Sublingual route every 5 minutes as needed for Chest Pain. up to 3 tabs  per episode. 1 bottle 1   . potassium chloride (K-DUR) 10 MEQ Sustained-Release tablet Take 1 tablet (10 mEq) by mouth daily. 60 tablet 3   . QUEtiapine (SEROQUEL) 200 MG tablet Take 1 tablet (200 mg) by mouth at bedtime. 60 tablet 2   . ropinirole (REQUIP) 4 MG tablet Take 1 tablet (4 mg) by mouth daily. 60 tablet 3   . traMADol (ULTRAM) 50 MG tablet Take 1 tablet (50 mg) by mouth every 6 hours as needed for Moderate Pain (Pain Score 4-6). 28 tablet 0     No current facility-administered medications for this visit.         Allergies:  Allergies   Allergen Reactions   . Quetiapine Other       Family History:  Family History   Problem Relation Name Age of Onset   . Diabetes Mother          Leg amputation   . Stroke Father  [id number]   . Heart Disease Brother     . Stroke Maternal Grandmother     . Heart Attack Maternal Grandmother         Social History:  Social History     Socioeconomic History   . Marital status: Single     Spouse name: Not on file   . Number of children: Not on file   . Years of education: Not on file   . Highest education level: Not on file   Occupational History   . Not on file   Tobacco Use   . Smoking status: Current Every Day Smoker     Packs/day: 1.50     Years: 41.00     Pack years: 61.50     Types: Cigarettes   . Smokeless tobacco: Never Used   Substance and Sexual Activity   . Alcohol use: No   . Drug use: No     Comment: ex drug use 2016 - cocaine   . Sexual activity: Never     Comment: Not sexually active in 3 years   Social Activities of Daily  Living Present   . Not on file   Social History Narrative   . Not on file          Review of Systems:  Comprehensive 12-point review of systems is negative, except as noted in HPI.      Objective:     Physical Exam:   BP 110/65 (BP Location: Left arm, BP Patient Position: Sitting, BP cuff size: Regular)   Pulse 83   Temp 97.8 F (36.6 C) (Oral)   Resp 19   Ht 5' 2.01" (1.575 m)   Wt 105 kg (231 lb 7.7 oz)   LMP  (LMP Unknown)   BMI 42.33 kg/m        GEN: NAD, conversant   Left Breast: No skin lesions, no masses palpated  Right Breast: No skin lesions, no masses palpated  Left Axilla: No LAD  Right Axilla: No LAD  Left Supraclavicular fossa: No LAD  Right Supraclavicular fossa: No LAD    Abd: soft, ND, NT, no HSM  Skin: large red and macerated area in skin fold (worse on left abdomen) under pannus.    EGBUS: Normal external genitalia, no skin lesions  Vagina: Pink with normal mucosa. No lesions, no atrophy  Cervix: Absent, s/p total hysterectomy  Perineum: wnl  Rectum: No hemorrhoids, no lesions  Manual exam: No mass or nodules palpated  Extrem:  no edema or cyanosis    Labs and Imaging:  None pertinent    Assessment & Plan:     1. Well woman exam  Postmenopausal, s/p TAH-BSO in 1993 due to cervical cancer. GU exam negative for lesions. Will sent for following screening tests.    - Screening Digital Mammogram Bilateral; Future  - Cytology GYN  - Hepatitis C Antibody Yellow serum separator tube; Future  - HIV 1/2 Antibody & P24 Antigen Assay Yellow serum separator tube; Future    2. Candidal intertrigo  Inflamed skin under panus.   - Counseled on wearing wicking/cotton clothing and keeping area dry & vented  - nystatin (MYCOSTATIN) 100000 UNIT/GM cream; Apply to irritated area four times a day  Dispense: 30 g; Refill: 2    3. Need for vaccination  - Tdap (BOOSTRIX/ADACEL) for patients 89-76 years old    4. Carotid stenosis, left  Patient feeling more dizzy and lightheaded recently. High risk of stroke though no symptoms of TIA or acute CVA. Will request urgent referral to vascular surgery for surgical evaluation.   - Consult/Referral to Vascular Surgery      Barriers to Learning assessed: none. Patient verbalizes understanding of teaching and instructions.    Requested Prescriptions     Signed Prescriptions Disp Refills   . nystatin (MYCOSTATIN) 100000 UNIT/GM cream 30 g 2     Sig: Apply to irritated area four times a day       Follow Up: No follow-ups on  file.    Future Appointments   Date Time Provider Department Center   05/08/2018  8:40 AM Wende Crease, MD [facility]         Patient discussed with attending, Dr. Laveda Norman

## 2018-04-15 LAB — HEPATITIS C AB, BLOOD: Hepatitis C Ab: REACTIVE — AB

## 2018-04-15 LAB — HIV 1/2 ANTIBODY & P24 ANTIGEN ASSAY, BLOOD: HIV  1+2 Ab plus HIV 1 p24 Ant Scrn: NONREACTIVE

## 2018-04-16 ENCOUNTER — Encounter: Payer: Self-pay | Admitting: Student in an Organized Health Care Education/Training Program

## 2018-04-16 DIAGNOSIS — F17200 Nicotine dependence, unspecified, uncomplicated: Secondary | ICD-10-CM | POA: Insufficient documentation

## 2018-04-17 ENCOUNTER — Telehealth: Payer: Self-pay

## 2018-04-17 ENCOUNTER — Telehealth: Payer: Self-pay | Admitting: Student in an Organized Health Care Education/Training Program

## 2018-04-17 DIAGNOSIS — G2581 Restless legs syndrome: Secondary | ICD-10-CM

## 2018-04-17 NOTE — Telephone Encounter (Signed)
Attempted to contact Regina Ward, 57 year old female, in regards to vascular surgery referral but she did not answer. Will attempt to give her a status change at a later time.

## 2018-04-17 NOTE — Telephone Encounter (Signed)
Forward message to Dr. Lin for review

## 2018-04-17 NOTE — Telephone Encounter (Signed)
Ana from [facility] requesting a call back patient states that Dr Juel BurrowLin advised her to take Requip 4 mg 2 times daily instead of 1 patient is not out of medication and is requesting a refill please call pharmacy to clarify     Thank you

## 2018-04-18 LAB — CYTOLOGY GYN - ~~LOC~~

## 2018-04-18 LAB — HISTORICAL HISTOLOGY DATA

## 2018-04-18 NOTE — Telephone Encounter (Signed)
2nd call, Seward GraterMaggie from [facility] is following up on the below message from 04/17/2018. I called the iPhone and left a voicemail. Please call back the pharmacy to assist, thank you.

## 2018-04-19 NOTE — Telephone Encounter (Signed)
Forward message to Dr. Lin for review

## 2018-04-20 LAB — HPV, HIGH RISK GROUP WITH 16 AND 18 GENOTYPE BY PCR
HPV Type 16: NOT DETECTED
HPV Type 18: NOT DETECTED
Other Types HPV HR: NOT DETECTED

## 2018-04-20 NOTE — Telephone Encounter (Signed)
Forward message to Dr. Lin for review

## 2018-04-20 NOTE — Telephone Encounter (Signed)
3rd call. Please see previous messages from 07/15,  07/16. Angelique BlonderDenise calling in to verify dosage. Iphone utilized. Thank you

## 2018-04-24 NOTE — Telephone Encounter (Signed)
Relayed information to Synetta Failnita in regards to authorized vascular surgery referral. Authorization [id number].  Approved to Dr.Harward  [phone].  Synetta Failnita claims she will assist the patient with scheduling a follow up appointment.

## 2018-04-24 NOTE — Telephone Encounter (Signed)
Called pt re: ropinirole dosage. She should only be taking 4mg  once day for her restless leg syndrome. Will refill her ropinirole for same dosage. Called [facility] to confirm ropinirole 4mg  daily refills.      Plan:  - Continue ropinirole 4mg  daily  - [facility] will see if pt can get insurance approval for a few more ropinirole dispensed to tie patient over until next 60day refill in August. Pt will reach out to her pharmacy.  - Iron/TIBC/Ferritin panel ordered, pt aware and will have them done before her appt with Dr. Juel BurrowLin on 05/08/2018  - Pt will discuss possible counter-interactions of Seroquel and ropinirole with outside psychiatrist to balance bipolar, sleep and RLS  - May consider referring to RLS specialist if symptoms not improved

## 2018-04-24 NOTE — Telephone Encounter (Signed)
4th call please see previus messages from 04/17/2018. I phone utilized and left a voice mail.

## 2018-04-24 NOTE — Telephone Encounter (Signed)
4th request message forward to jfac

## 2018-05-04 ENCOUNTER — Telehealth: Payer: Self-pay | Admitting: Student in an Organized Health Care Education/Training Program

## 2018-05-04 DIAGNOSIS — E119 Type 2 diabetes mellitus without complications: Secondary | ICD-10-CM

## 2018-05-04 DIAGNOSIS — R768 Other specified abnormal immunological findings in serum: Secondary | ICD-10-CM

## 2018-05-04 NOTE — Telephone Encounter (Signed)
Patient has an appointment scheduled on 05/09/2018 with the Physician assistant.  Regina Ward, [id number] female, verbalized understanding and states that she will be able to attended appointment. Verbalized clinic information for the specialist.   No further questions or concerns.

## 2018-05-04 NOTE — Telephone Encounter (Signed)
Patient's HCV antibody is positive. Will order HCV RNA NAAT (Quant Assay) to check if active infection. Will also repeat A1c to make sure if continues to be elevated at or around 8.8. Msg to front desk to call pt and instruct her to have labs done prior to her 05/08/2018 appt with Dr. Juel BurrowLin if possible.

## 2018-05-05 NOTE — Telephone Encounter (Signed)
Left pt voicemail advising to have labs done prior to [phone] if possible. Provided her w our lab hrs (open til 12pm). Can [facility] them at [facility] as well but would need lab print out. Our office is open til 5pm if she needs the order.

## 2018-05-05 NOTE — Telephone Encounter (Signed)
Patient Call      Regina Ward, Linda, MD  [facility] 16 hours ago (5:08 PM)      Patient has appt with me on 05/08/2018. Please call pt to let her know that I ordered a couple more blood tests for her to get before her visit. If possible, please give her the labs where she could have them done. Thanks Dr. Juel BurrowLin    Routing comment

## 2018-05-08 ENCOUNTER — Encounter: Payer: Self-pay | Admitting: Student in an Organized Health Care Education/Training Program

## 2018-05-08 ENCOUNTER — Ambulatory Visit
Payer: No Typology Code available for payment source | Attending: Family Practice | Admitting: Student in an Organized Health Care Education/Training Program

## 2018-05-08 VITALS — BP 134/66 | HR 60 | Temp 98.4°F | Resp 16 | Ht 62.0 in | Wt 227.1 lb

## 2018-05-08 DIAGNOSIS — F17200 Nicotine dependence, unspecified, uncomplicated: Secondary | ICD-10-CM | POA: Insufficient documentation

## 2018-05-08 DIAGNOSIS — E119 Type 2 diabetes mellitus without complications: Secondary | ICD-10-CM | POA: Insufficient documentation

## 2018-05-08 DIAGNOSIS — R238 Other skin changes: Secondary | ICD-10-CM | POA: Insufficient documentation

## 2018-05-08 DIAGNOSIS — G2581 Restless legs syndrome: Secondary | ICD-10-CM | POA: Insufficient documentation

## 2018-05-08 DIAGNOSIS — F331 Major depressive disorder, recurrent, moderate: Secondary | ICD-10-CM | POA: Insufficient documentation

## 2018-05-08 DIAGNOSIS — I6522 Occlusion and stenosis of left carotid artery: Secondary | ICD-10-CM | POA: Insufficient documentation

## 2018-05-08 DIAGNOSIS — F3175 Bipolar disorder, in partial remission, most recent episode depressed: Secondary | ICD-10-CM | POA: Insufficient documentation

## 2018-05-08 DIAGNOSIS — R768 Other specified abnormal immunological findings in serum: Secondary | ICD-10-CM | POA: Insufficient documentation

## 2018-05-08 DIAGNOSIS — R233 Spontaneous ecchymoses: Secondary | ICD-10-CM

## 2018-05-08 DIAGNOSIS — Z6841 Body Mass Index (BMI) 40.0 and over, adult: Secondary | ICD-10-CM | POA: Insufficient documentation

## 2018-05-08 LAB — COMPREHENSIVE METABOLIC PANEL, BLOOD
ALT: 41 U/L (ref 7–52)
AST: 40 U/L — ABNORMAL HIGH (ref 13–39)
Albumin: 4.2 G/DL (ref 3.7–5.3)
Alk Phos: 63 U/L (ref 34–104)
BUN: 13 mg/dL (ref 7–25)
Bilirubin, Total: 0.6 mg/dL (ref 0.0–1.4)
CO2: 27 mmol/L (ref 21–31)
Calcium: 9.6 mg/dL (ref 8.6–10.3)
Chloride: 108 mmol/L — ABNORMAL HIGH (ref 98–107)
Creat: 0.9 mg/dL (ref 0.6–1.2)
Electrolyte Balance: 4 mmol/L (ref 2–12)
Glucose: 137 mg/dL — ABNORMAL HIGH (ref 85–125)
Potassium: 4.1 mmol/L (ref 3.5–5.1)
Protein, Total: 7.2 G/DL (ref 6.0–8.3)
Sodium: 139 mmol/L (ref 136–145)
eGFR - high estimate: >60 (ref >59)
eGFR - low estimate: >60 (ref >59)

## 2018-05-08 LAB — PT/INR/PTT
INR: 0.95 (ref 0.88–1.12)
PTT: 27.8 s (ref 24.9–34.5)
Prothrombin Time: 12.7 s (ref 12.0–14.4)

## 2018-05-08 MED ORDER — METFORMIN HCL 500 MG OR TABS
500.0000 mg | ORAL_TABLET | Freq: Two times a day (BID) | ORAL | 2 refills | Status: DC
Start: 2018-05-08 — End: 2018-05-22

## 2018-05-08 NOTE — Addendum Note (Signed)
Addended by: Ernesta AmbleMOCTEZUMA, [place] ALEJANDRA on: 05/08/2018 09:53 AM     Modules accepted: Orders

## 2018-05-08 NOTE — Progress Notes (Signed)
Ambulatory Visit Note  [name], MD  PGY-3, Family Medicine     PCP: [name]    Language: Cleophus Molt     PATIENT ID: Regina Ward, 57 year old female  REASON FOR VISIT:   Chief Complaint   Patient presents with   . Follow Up       HISTORY OF PRESENT ILLNESS:  Regina Ward is a 57 year old female who has a past medical history of mental illness (PTSD, bipolar, schizophrenia, anxiety), COPD, bilateral carotid stenosis s/p right thromboenderectomy, CVA with left sided neuro-deficits and chronic insomnia, and diabetes who presents today for follow-up.     # RLS  - Iron/TIBC/Ferritin pending    # DM2  - H/o DM2, was on metformin for one year, but stopped because A1c controlled by diet & lifestyle  - A1c 8.8 on 04/07/2018; 6.6 on 07/06/2017  - TSH wnl on 04/07/2018  - increased to Seroquel '200mg'$  qHS (several months)  - patient lives with cousin, unable to coo     # HCV  - HCV RNA Quant ordered pending  - LFTs on 04/07/2018 normal     # HCM  - Mammogram DUE, ordered 04/14/2018, yet to be completed    # H/o Uterine [place]  - s/p TAH with BSO  - pap smear without malignant cells or HPV    # Bipolar, PTSD, MDD  - no SI/HI or mania  - sees outside psychiatrist, does not feel like she is being treated adequately, seeks second opinion and contemplating on possible switching to different provider in future  - on seroquel '200mg'$  qHS for insomnia  - not on any mood stabilizer (states used to be)     # Unusual bruising  - for past couple months  - bruising comes and goes on torso  - does not remember trauma  - no known bleeding diathesis  - no unusual bleeding    # LUE weakness s/p CVA in 2018   - secondary to high grade bilateral carotid stenosis s/p right CEA 12/2016  - referred to [facility]  - left hand not as strong and mobile as prior to stroke and desires strengthening and increase range of motion.    # Left Carotid Stenosis  - Prior records from her vascular surgeon in [place] re: left carotid stenosis in [place]. She  was told by prior surgeon that she will need an left carotid endarterectomy.    - As of 01/2017, right internal carotid with 1-15% diameter reduction in lumen while left internal carotid with 50-79% reduction. Per prior note in 12/2016, left carotid was noted to to have high grade stenosis >90% on CT.   - Scheduled for 05/09/2018 to see vascular surgery    # Tobacco Use D/o  - Heavy smoker for 40 years  - Highly motivated to quit, was smoking 1+ pack/day  - started on nicotine patches, now down to 3-4 cigarettes a day      PAST MEDICAL HISTORY:  Patient Active Problem List   Diagnosis   . Insomnia due to other mental disorder   . Bilateral carotid artery stenosis   . Schizophrenia (CMS-HCC)   . Stroke (cerebrum) (CMS-HCC)   . COPD with asthma (CMS-HCC)   . Depression   . Bipolar 1 disorder (CMS-HCC)   . Restless leg syndrome   . Posttraumatic stress disorder   . Type 2 diabetes mellitus without complication, without long-term current use of insulin (CMS-HCC)   .  OSA (obstructive sleep apnea)   . History of uterine cancer   . Tobacco use disorder       MEDICATIONS:   Outpatient Medications Marked as Taking for the 05/08/18 encounter (Office Visit) with [name], MD   Medication Sig Dispense Refill   . acetaminophen (TYLENOL) 500 MG tablet Take 2 tablets (1,000 mg) by mouth every 8 hours as needed for Moderate Pain (Pain Score 4-6). 30 tablet 0   . aspirin 81 MG EC tablet Take 1 tablet (81 mg) by mouth daily. 60 tablet 3   . atorvastatin (LIPITOR) 80 MG tablet Take 1 tablet (80 mg) by mouth nightly. 60 tablet 3   . clopidogrel (PLAVIX) 75 MG tablet Take 1 tablet (75 mg) by mouth daily. 60 tablet 3   . cyclobenzaprine (FLEXERIL) 10 MG tablet Take 1 tablet (10 mg) by mouth every 8 hours as needed for Muscle Spasms. 60 tablet 0   . diclofenac (VOLTAREN) 1 % gel Apply 1 g topically 4 times daily. 1 Tube 1   . furosemide (LASIX) 20 MG tablet Take 1 tablet (20 mg) by mouth daily. 60 tablet 3   . hydrOXYzine HCL (ATARAX) 25 MG  tablet Take 25 mg by mouth 3 times daily as needed for Itching.     Marland Kitchen ibuprofen (MOTRIN) 600 MG tablet take 1 tablet by mouth every 8 hours 7 days  0   . nicotine (NICOTINE STEP 1) 21 MG/24HR patch Step 1: apply 1 patch on skin every 24 hours for weeks 1-6 after stop smoking 42 patch 0   . nicotine (NICOTINE STEP 2) 14 MG/24HR Step 2: apply 1 patch on skin every 24 hours for weeks 7-8 after stop smoking 14 patch 0   . nicotine (NICOTINE STEP 3) 7 MG/24HR Step 3: apply 1 patch on skin every 24 hours for weeks 9-10 after stop smoking 14 patch 0   . nitroGLYcerin (NITROSTAT) 0.4 MG SL tablet 1 tablet (0.4 mg) by Sublingual route every 5 minutes as needed for Chest Pain. up to 3 tabs per episode. 1 bottle 1   . nystatin (MYCOSTATIN) 100000 UNIT/GM cream Apply to irritated area four times a day 30 g 2   . potassium chloride (K-DUR) 10 MEQ Sustained-Release tablet Take 1 tablet (10 mEq) by mouth daily. 60 tablet 3   . QUEtiapine (SEROQUEL) 200 MG tablet Take 1 tablet (200 mg) by mouth at bedtime. 60 tablet 2   . ropinirole (REQUIP) 4 MG tablet Take 1 tablet (4 mg) by mouth daily. 60 tablet 3   . traMADol (ULTRAM) 50 MG tablet Take 1 tablet (50 mg) by mouth every 6 hours as needed for Moderate Pain (Pain Score 4-6). 28 tablet 0       ALLERGIES:  Allergies   Allergen Reactions   . Quetiapine Other       HEALTH MAINTENANCE:  Most Recent Immunizations   Administered Date(s) Administered   . Influenza Vaccine >=3 Years 07/01/2017   . Tdap 04/14/2018         REVIEW OF SYSTEMS:  Review of Systems   All other systems reviewed and are negative.      PHYSICAL EXAMINATION:  BP 134/66 (BP Location: Left arm, BP Patient Position: Sitting, BP cuff size: Regular)   Pulse 60   Temp 98.4 F (36.9 C) (Oral)   Resp 16   Ht '5\' 2"'$  (1.575 m)   Wt 103 kg (227 lb 1.2 oz)   LMP  (LMP Unknown)  SpO2 100%   BMI 41.53 kg/m     General: Obese. African American. No acute distress. Conversant.  HEENT: NC/AT. PERRL. EOMI. MMM, clear  oropharynx.   Neck: No thyromegaly or nodules. No lymphadenopathy  CV: RRR no MRG  Pulm: CTAB no WRR  Abd: Soft, NT/ND. +BS. No rebound/guarding. Quarter size bruise above umbilicus, non-TTP.              Extremities: Warm, well perfused . No peripheral edema.   Skin: No rashes, lesions, breakdown  Neuro: A&Ox3. Normal gait. CN II-XII intact. MS 4/5 in left UE and LE.   Psych: appropriately groomed, good eye contact, non-pressured speech, linear thought, mood ok, full affect, congruent, no SI/HI/delusions/hallucinations, limited insight/judgment         LABS AND IMAGING:    Lab Results   Component Value Date    RBC 4.25 04/07/2018    HGB 12.5 04/07/2018    HCT 37.1 04/07/2018    MCV 87.4 04/07/2018    MCHC 33.8 04/07/2018    RDW 16.1 (H) 04/07/2018    PLT 212 04/07/2018     Lab Results   Component Value Date    CHOL 105 04/07/2018    LDLCALC 51 04/07/2018    TRIG 83 04/07/2018     Lab Results   Component Value Date    A1C 8.8 (H) 04/07/2018    A1C 6.6 (H) 07/06/2017     No results found for: GLUCPOCT  Lab Results   Component Value Date    K 3.7 04/07/2018    CL 107 04/07/2018    BUN 12 04/07/2018    CREAT 0.9 04/07/2018    GLU 161 (H) 04/07/2018    [place] 9.3 04/07/2018     Lab Results   Component Value Date    AST 34 04/07/2018    ALT 39 04/07/2018    ALK 66 04/07/2018    ALB 4.3 04/07/2018    TBILI 0.3 04/07/2018     No results found for: TSH      ASSESSMENT/PLAN:    1. Type 2 diabetes mellitus without complication, without long-term current use of insulin (CMS-HCC)  A1c 8.8 on 04/07/2018. H/o T2DM. Normal thyroid function. Previously on metformin.   - Restart on metformin '500mg'$  qAM for one week and titrate up to '500mg'$  bid   - Discussed exercise and diet modifications (diabetic diet handout given)  - Counseled on weight loss  - Consult/referral to [facility] (Dietician referral)  - Comprehensive Metabolic Panel - See Instructions; Future  - Glycosylated Hgb(A1C), Blood [place]  - FU in 3 weeks with Dr.  Augustin Coupe    2. Restless leg syndrome  - Continue ropinirole '4mg'$  daily  - F/u iron panel, ferritin; consider supplement if low on ferritin  - Advised pt to speak to psychiatrist given antipsychotics may worsen RLS    3. Tobacco use disorder  - Patient reports doing well with Step1 Nicotine patch  - Congratulated patient on her commitment  - Continue 3-step nicotine patch treatment    4. Positive hepatitis C antibody test  - Normal LFTs, exam unremarkable for acute hepatitis  - likely positive from prior exposure, per pt possibly already treated  - Hepatitis C RNA, Quant Blood - See Instructions    5. Carotid stenosis, left  - Asymptomatic but given prior stroke from bilateral carotid stenosis, pt has been cautioned with symptoms of stroke. Strict ED precautions provided.  - Pt to see Vascular surgery on 05/09/2018 for  surgical consult    6. Bipolar disorder, in partial remission, most recent episode depressed (CMS-HCC)  7. Moderate episode of recurrent major depressive disorder (CMS-HCC)  Patient is taking seroquel '200mg'$  qHS for her bipolar and insomnia. However she states that her mood/depression is not well controlled. PHQ with moderate depression today.   - Continue current RX  - Refer to [place] for second opinion, add mood stabilizer? Reduce antipsychotics (in setting of worsening RLS)?  - Consider giving [facility] contact if pt insists on changing outside psychiatrist    8. Easy bruising  Pt is on ASA and clopidogrel but has been on this for couple years since her stroke. No known bleeding diathesis. LFT wnl. Continue to monitor.   - Reassurance given  - PT/INR/PTT Blue; Future    Return in about 3 weeks (around 05/29/2018) for with Dr. Augustin Coupe; and earliest appt with mental health.    Future Appointments   Date Time Provider [place]   05/22/2018  2:00 PM [name], MD [place]   08/02/2018  8:40 [facility]         Patient was given strict return  precautions should symptoms worsen or fail to improve for any concerns.    Orders Placed This Encounter   Procedures   . Comprehensive Metabolic Panel - See Instructions   . PT/INR/PTT Blue   . Consult/referral to [place]

## 2018-05-08 NOTE — Patient Instructions (Addendum)
Please have your labs done  Please pick up your metformin, take 500mg  (1 tablet) in the morning with food for the first week; and then if you feel ok, increase to 1 tablet in the morning and 1 tablet in the evening with food  Please meet with our dietician to discuss diabetic diet      Diabetes Mellitus and Nutrition  When you have diabetes (diabetes mellitus), it is very important to have healthy eating habits because your blood sugar (glucose) levels are greatly affected by what you eat and drink. Eating healthy foods in the appropriate amounts, at about the same times every day, can help you:   Control your blood glucose.   Lower your risk of heart disease.   Improve your blood pressure.   Reach or maintain a healthy weight.    Every person with diabetes is different, and each person has different needs for a meal plan. Your health care provider may recommend that you work with a diet and nutrition specialist (dietitian) to make a meal plan that is best for you. Your meal plan may vary depending on factors such as:   The calories you need.   The medicines you take.   Your weight.   Your blood glucose, blood pressure, and cholesterol levels.   Your activity level.   Other health conditions you have, such as heart or kidney disease.    How do carbohydrates affect me?  Carbohydrates affect your blood glucose level more than any other type of food. Eating carbohydrates naturally increases the amount of glucose in your blood. Carbohydrate counting is a method for keeping track of how many carbohydrates you eat. Counting carbohydrates is important to keep your blood glucose at a healthy level, especially if you use insulin or take certain oral diabetes medicines.  It is important to know how many carbohydrates you can safely have in each meal. This is different for every person. Your dietitian can help you calculate how many carbohydrates you should have at each meal and for snack.  Foods that contain  carbohydrates include:   Bread, cereal, rice, pasta, and crackers.   Potatoes and corn.   Peas, beans, and lentils.   Milk and yogurt.   Fruit and juice.   Desserts, such as cakes, cookies, ice cream, and candy.    How does alcohol affect me?  Alcohol can cause a sudden decrease in blood glucose (hypoglycemia), especially if you use insulin or take certain oral diabetes medicines. Hypoglycemia can be a life-threatening condition. Symptoms of hypoglycemia (sleepiness, dizziness, and confusion) are similar to symptoms of having too much alcohol.  If your health care provider says that alcohol is safe for you, follow these guidelines:   Limit alcohol intake to no more than 1 drink per day for nonpregnant women and 2 drinks per day for men. One drink equals 12 oz of beer, 5 oz of wine, or 1 oz of hard liquor.   Do not drink on an empty stomach.   Keep yourself hydrated with water, diet soda, or unsweetened iced tea.   Keep in mind that regular soda, juice, and other mixers may contain a lot of sugar and must be counted as carbohydrates.    What are tips for following this plan?  Reading food labels   Start by checking the serving size on the label. The amount of calories, carbohydrates, fats, and other nutrients listed on the label are based on one serving of the food. Many foods contain  more than one serving per package.   Check the total grams (g) of carbohydrates in one serving. You can calculate the number of servings of carbohydrates in one serving by dividing the total carbohydrates by 15. For example, if a food has 30 g of total carbohydrates, it would be equal to 2 servings of carbohydrates.   Check the number of grams (g) of saturated and trans fats in one serving. Choose foods that have low or no amount of these fats.   Check the number of milligrams (mg) of sodium in one serving. Most people should limit total sodium intake to less than 2,300 mg per day.   Always check the nutrition  information of foods labeled as "low-fat" or "nonfat". These foods may be higher in added sugar or refined carbohydrates and should be avoided.   Talk to your dietitian to identify your daily goals for nutrients listed on the label.  Shopping   Avoid buying canned, premade, or processed foods. These foods tend to be high in fat, sodium, and added sugar.   Shop around the outside edge of the grocery store. This includes fresh fruits and vegetables, bulk grains, fresh meats, and fresh dairy.  Cooking   Use low-heat cooking methods, such as baking, instead of high-heat cooking methods like deep frying.   Cook using healthy oils, such as olive, canola, or sunflower oil.   Avoid cooking with butter, cream, or high-fat meats.  Meal planning   Eat meals and snacks regularly, preferably at the same times every day. Avoid going long periods of time without eating.   Eat foods high in fiber, such as fresh fruits, vegetables, beans, and whole grains. Talk to your dietitian about how many servings of carbohydrates you can eat at each meal.   Eat 4-6 ounces of lean protein each day, such as lean meat, chicken, fish, eggs, or tofu. 1 ounce is equal to 1 ounce of meat, chicken, or fish, 1 egg, or 1/4 cup of tofu.   Eat some foods each day that contain healthy fats, such as avocado, nuts, seeds, and fish.  Lifestyle     Check your blood glucose regularly.   Exercise at least 30 minutes 5 or more days each week, or as told by your health care provider.   Take medicines as told by your health care provider.   Do not use any products that contain nicotine or tobacco, such as cigarettes and e-cigarettes. If you need help quitting, ask your health care provider.   Work with a Veterinary surgeoncounselor or diabetes educator to identify strategies to manage stress and any emotional and social challenges.  What are some questions to ask my health care provider?   Do I need to meet with a diabetes educator?   Do I need to meet with a  dietitian?   What number can I call if I have questions?   When are the best times to check my blood glucose?  Where to find more information:   American Diabetes Association: diabetes.org/food-and-fitness/food   Academy of Nutrition and Dietetics: https://www.vargas.com/www.eatright.org/resources/health/diseases-and-conditions/diabetes   [employer] Institute of Diabetes and Digestive and Kidney Diseases (NIH): FindJewelers.czwww.niddk.nih.gov/health-information/diabetes/overview/diet-eating-physical-activity  Summary   A healthy meal plan will help you control your blood glucose and maintain a healthy lifestyle.   Working with a diet and nutrition specialist (dietitian) can help you make a meal plan that is best for you.   Keep in mind that carbohydrates and alcohol have immediate effects on your blood glucose levels. It  is important to count carbohydrates and to use alcohol carefully.  This information is not intended to replace advice given to you by your health care provider. Make sure you discuss any questions you have with your health care provider.  Document Released: 06/17/2005 Document Revised: 10/25/2016 Document Reviewed: 10/25/2016  Elsevier Interactive Patient Education  [employer].

## 2018-05-08 NOTE — Progress Notes (Signed)
Attending Note:  Agree with the above documentation regarding this patient. Patient was discussed with the resident physician. The assessment and plan was then jointly formulated with the resident physician.    BP 134/66 (BP Location: Left arm, BP Patient Position: Sitting, BP cuff size: Regular)   Pulse 60   Temp 98.4 F (36.9 C) (Oral)   Resp 16   Ht 5\' 2"  (1.575 m)   Wt 103 kg (227 lb 1.2 oz)   LMP  (LMP Unknown)   SpO2 100%   BMI 41.53 kg/m   Exam as per resident    A and P  # DM: Uncontrolled, start metformin and titrate up. Referral to Ophthalmology   # Moderate Depression: Referral to MH for possible mood stabilizer per patient request, c/w seroquel  # HCM: PAP UTD, mammogram ordered    See resident note for further details.     Macarthur CritchleyJeffrey Otillia Cordone, M.D.  Assistant Clinical Professor  [facility] Department of Family Medicine

## 2018-05-08 NOTE — Addendum Note (Signed)
Addended by: Marcellino Fidalgo ALEJANDRA on: 05/08/2018 09:53 AM     Modules accepted: Orders

## 2018-05-09 LAB — GLYCOSYLATED HGB(A1C), BLOOD: Glycated Hgb, A1C: 8.3 % — ABNORMAL HIGH (ref 4.6–5.6)

## 2018-05-10 LAB — HEPATITIS C RNA, QUANT BLOOD: Hepatitis C PCR Quantitative Result: 2163091 IU/ML

## 2018-05-12 NOTE — Telephone Encounter (Signed)
Attempted to collect clinical notes but the office was closed. Will try at a later time.

## 2018-05-17 NOTE — Telephone Encounter (Signed)
Representative states the patient was seen on 05/09/2018.   They will be faxing clinical notes to [phone].

## 2018-05-22 ENCOUNTER — Ambulatory Visit
Payer: No Typology Code available for payment source | Attending: Family Medicine | Admitting: Student in an Organized Health Care Education/Training Program

## 2018-05-22 ENCOUNTER — Encounter: Payer: Self-pay | Admitting: Student in an Organized Health Care Education/Training Program

## 2018-05-22 VITALS — BP 105/68 | HR 71 | Temp 99.0°F | Resp 18 | Ht 62.0 in | Wt 220.0 lb

## 2018-05-22 DIAGNOSIS — R233 Spontaneous ecchymoses: Secondary | ICD-10-CM

## 2018-05-22 DIAGNOSIS — R238 Other skin changes: Secondary | ICD-10-CM | POA: Insufficient documentation

## 2018-05-22 DIAGNOSIS — G2581 Restless legs syndrome: Secondary | ICD-10-CM | POA: Insufficient documentation

## 2018-05-22 DIAGNOSIS — F17200 Nicotine dependence, unspecified, uncomplicated: Secondary | ICD-10-CM | POA: Insufficient documentation

## 2018-05-22 DIAGNOSIS — E119 Type 2 diabetes mellitus without complications: Secondary | ICD-10-CM | POA: Insufficient documentation

## 2018-05-22 DIAGNOSIS — F99 Mental disorder, not otherwise specified: Secondary | ICD-10-CM | POA: Insufficient documentation

## 2018-05-22 DIAGNOSIS — F5105 Insomnia due to other mental disorder: Secondary | ICD-10-CM | POA: Insufficient documentation

## 2018-05-22 DIAGNOSIS — F331 Major depressive disorder, recurrent, moderate: Secondary | ICD-10-CM | POA: Insufficient documentation

## 2018-05-22 DIAGNOSIS — B182 Chronic viral hepatitis C: Secondary | ICD-10-CM | POA: Insufficient documentation

## 2018-05-22 DIAGNOSIS — Z6841 Body Mass Index (BMI) 40.0 and over, adult: Secondary | ICD-10-CM | POA: Insufficient documentation

## 2018-05-22 DIAGNOSIS — R29898 Other symptoms and signs involving the musculoskeletal system: Secondary | ICD-10-CM | POA: Insufficient documentation

## 2018-05-22 DIAGNOSIS — I6523 Occlusion and stenosis of bilateral carotid arteries: Secondary | ICD-10-CM | POA: Insufficient documentation

## 2018-05-22 DIAGNOSIS — F319 Bipolar disorder, unspecified: Secondary | ICD-10-CM | POA: Insufficient documentation

## 2018-05-22 MED ORDER — METFORMIN HCL 1000 MG OR TABS
1000.0000 mg | ORAL_TABLET | Freq: Two times a day (BID) | ORAL | 3 refills | Status: DC
Start: 2018-05-22 — End: 2018-12-22

## 2018-05-22 NOTE — Progress Notes (Signed)
ATTENDING ATTESTATION NOTE  I discussed and saw this 57 year old female patient with resident physician Dr. Juel BurrowLin. The assessment and plan was then jointly formulated with the resident physician.   Regina Ward has a past hx of: schizophrenia, bipolar, COPD, CVA w L sided deficit, DM, HCV - chronic active w recent elevated viral loadand she presents today with a complaint of follow up. Further hx includes no GI or DM sxs. The rest of hx and ROS per resident note.   Exam: BP 105/68 (BP Location: Right arm, BP Patient Position: Sitting, BP cuff size: Large)   Pulse 71   Temp 99 F (37.2 C) (Oral)   Resp 18   Ht 5\' 2"  (1.575 m)   Wt 99.8 kg (220 lb 0.3 oz)   LMP  (LMP Unknown)   BMI 40.24 kg/m  physical exam per the resident, confirmed by me, shows she is alert, conversant, NAD, Cor:RR, Lungs: clear, ZOX:WRUEAbd:soft, NT, no masses, Ext  w/o edema, rest of exam unremarkable    Relevant lab and imaging results: A1c 8.3.   I agree with assessment of chronic HCV, check u/s, AFP, genotype, refer to GI, check Hep A and B serology, HIV, new dx DM w contribution from psych meds, not controlled, agree w incr metformin, multiple psych issues, follows with psychiatry, not severely depressed, no SI, con't with psych, Carotid vasc dz, prior CVA w L sided deficit, OT referral, hx RLS  and plan of ferritin, also on patch for tob cessation. Desmond Lope.     Jacobs Golab B Quinnley Colasurdo, MD

## 2018-05-22 NOTE — Progress Notes (Signed)
Ambulatory Visit Note  Regina Hires, MD  PGY-3, Family Medicine     PCP: Regina Ward    Language: Cleophus Molt     PATIENT ID: Regina Ward, 57 year old female  REASON FOR VISIT:   Chief Complaint   Patient presents with   . Results     Lab Results        HISTORY OF PRESENT ILLNESS:  Regina Ward is a 57 year old female who has a past medical history of mental illness (PTSD, bipolar, schizophrenia, anxiety), COPD, bilateral carotid stenosis s/p right thromboenderectomy, CVA with left sided neuro-deficits and chronic insomnia, and diabetes who presents today for follow-up.     # Positive HCV Ab  - HCV RNA Quant (05/08/2018) - 2,163,091  - First told she was HCV positive in 2005, never treated  - H/o blood transfusion in 12/29/2016  - H/o promiscuous sex but no IVDU  - LFTs on 05/08/2018 normal   - No s/sx of cirrhosis  - Denies fever, chills, yellowing of skin, n/v, abdominal pain, peripheral edema  - however does endorse easy bruising, recent INR/PT/PTT normal  - HIV 04/14/2018 negative  - Unclear HAV, HBV immunity status    # RLS  - Iron/TIBC/Ferritin pending (was not done with last batch of lab tests)    # DM2  - H/o DM2, was on metformin for one year (some years back in [place]), but stopped because A1c controlled by diet & lifestyle  - started on metformin '500mg'$  bid last visit on 8/5  - A1c 8.3 on 05/08/2018; 6.6 on 07/06/2017; TSH wnl on 04/07/2018  - increased to Seroquel '200mg'$  qHS (several months)  - patient lives with cousin, unable to cook for herself    # HCM  - Mammogram DUE, ordered 04/14/2018, yet to be completed    # H/o Uterine [employer]  - s/p TAH with BSO (no longer has cervix)  - pap smear without malignant cells or HPV    # Bipolar, PTSD, MDD  - no SI/HI or mania  - sees outside psychiatrist, does not feel like she is being treated adequately, seeks second opinion and contemplating on possible switching to different provider in future  - on seroquel '200mg'$  qHS for insomnia  - not on any mood stabilizer (states used to be);  says she is always depressed     # LUE weakness s/p CVA in 2018   - secondary to high grade bilateral carotid stenosis s/p right CEA 12/2016  - completed 6 OT sessions at [facility], reports was helpful and requesting more sessions   - left hand not as strong and mobile as prior to stroke and desires strengthening and increase range of motion.    # Left Carotid Stenosis  - Prior records from her vascular surgeon in [place] re: left carotid stenosis (scanned in eChart). She was told by prior surgeon that she will need an left carotid endarterectomy.    - As of 01/2017, right internal carotid with 1-15% diameter reduction in lumen while left internal carotid with 50-79% reduction. Per prior note in 12/2016, left carotid was noted to to have high grade stenosis >90% on CT.   - Seen vascular surgery, being assessed for surgery    # Tobacco Use D/o  - Heavy smoker for 40 years  - Highly motivated to quit, was smoking 1+ pack/day  - started on nicotine patches on 7/B/19, now down to 3-4 cigarettes a day; has 3 more boxes of  Step 1 patches left.       PAST MEDICAL HISTORY:  Patient Active Problem List   Diagnosis   . Insomnia due to other mental disorder   . Bilateral carotid artery stenosis   . Schizophrenia (CMS-HCC)   . Stroke (cerebrum) (CMS-HCC)   . COPD with asthma (CMS-HCC)   . Depression   . Bipolar 1 disorder (CMS-HCC)   . Restless leg syndrome   . Posttraumatic stress disorder   . Type 2 diabetes mellitus without complication, without long-term current use of insulin (CMS-HCC)   . OSA (obstructive sleep apnea)   . History of uterine cancer   . Tobacco use disorder       MEDICATIONS:   Outpatient Medications Marked as Taking for the 05/22/18 encounter (Office Visit) with Regina Hires, MD   Medication Sig Dispense Refill   . acetaminophen (TYLENOL) 500 MG tablet Take 2 tablets (1,000 mg) by mouth every 8 hours as needed for Moderate Pain (Pain Score 4-6). 30 tablet 0   . aspirin 81 MG EC tablet Take 1  tablet (81 mg) by mouth daily. 60 tablet 3   . atorvastatin (LIPITOR) 80 MG tablet Take 1 tablet (80 mg) by mouth nightly. 60 tablet 3   . clopidogrel (PLAVIX) 75 MG tablet Take 1 tablet (75 mg) by mouth daily. 60 tablet 3   . cyclobenzaprine (FLEXERIL) 10 MG tablet Take 1 tablet (10 mg) by mouth every 8 hours as needed for Muscle Spasms. 60 tablet 0   . diclofenac (VOLTAREN) 1 % gel Apply 1 g topically 4 times daily. 1 Tube 1   . furosemide (LASIX) 20 MG tablet Take 1 tablet (20 mg) by mouth daily. 60 tablet 3   . hydrOXYzine HCL (ATARAX) 25 MG tablet Take 25 mg by mouth 3 times daily as needed for Itching.     Marland Kitchen ibuprofen (MOTRIN) 600 MG tablet take 1 tablet by mouth every 8 hours 7 days  0   . metFORMIN (GLUCOPHAGE) 1000 MG tablet Take 1 tablet (1,000 mg) by mouth 2 times daily (with meals). 120 tablet 3   . nicotine (NICOTINE STEP 1) 21 MG/24HR patch Step 1: apply 1 patch on skin every 24 hours for weeks 1-6 after stop smoking 42 patch 0   . nicotine (NICOTINE STEP 2) 14 MG/24HR Step 2: apply 1 patch on skin every 24 hours for weeks 7-8 after stop smoking 14 patch 0   . nicotine (NICOTINE STEP 3) 7 MG/24HR Step 3: apply 1 patch on skin every 24 hours for weeks 9-10 after stop smoking 14 patch 0   . nitroGLYcerin (NITROSTAT) 0.4 MG SL tablet 1 tablet (0.4 mg) by Sublingual route every 5 minutes as needed for Chest Pain. up to 3 tabs per episode. 1 bottle 1   . nystatin (MYCOSTATIN) 100000 UNIT/GM cream Apply to irritated area four times a day 30 g 2   . potassium chloride (K-DUR) 10 MEQ Sustained-Release tablet Take 1 tablet (10 mEq) by mouth daily. 60 tablet 3   . QUEtiapine (SEROQUEL) 200 MG tablet Take 1 tablet (200 mg) by mouth at bedtime. 60 tablet 2   . ropinirole (REQUIP) 4 MG tablet Take 1 tablet (4 mg) by mouth daily. 60 tablet 3   . traMADol (ULTRAM) 50 MG tablet Take 1 tablet (50 mg) by mouth every 6 hours as needed for Moderate Pain (Pain Score 4-6). 28 tablet 0       ALLERGIES:  No Known  Allergies  HEALTH MAINTENANCE:  Most Recent Immunizations   Administered Date(s) Administered   . Influenza Vaccine >=3 Years 07/01/2017   . Tdap 04/14/2018         REVIEW OF SYSTEMS:  Review of Systems   All other systems reviewed and are negative.      PHYSICAL EXAMINATION:  BP 105/68 (BP Location: Right arm, BP Patient Position: Sitting, BP cuff size: Large)   Pulse 71   Temp 99 F (37.2 C) (Oral)   Resp 18   Ht '5\' 2"'$  (1.575 m)   Wt 99.8 kg (220 lb 0.3 oz)   LMP  (LMP Unknown)   BMI 40.24 kg/m     General: Obese. African American. No acute distress. Conversant.  Neck: No JVD. No bruits  CV: RRR no MRG  Pulm: CTAB no WRR  Abd: Soft, NT/ND. +BS. No rebound/guarding, non-TTP.              Extremities: Warm, well perfused . No peripheral edema.   Skin: Bilateral thigh with few nickel size bruises  Neuro: A&Ox3. Normal gait. CN II-XII intact. MS 4/5 in left UE and LE.   Psych: appropriately groomed, good eye contact, non-pressured speech, linear thought, mood ok, full affect, congruent, no SI/HI/delusions/hallucinations, limited insight/judgment       [place] PHQ9 DEPRESSION QUESTIONNAIRE 10/25/2017 02/01/2018 03/07/2018 04/14/2018 05/08/2018 05/08/2018 05/22/2018   Interest 3 0 (No Data) 3 0 3 3   Depressed 3 0 -- 3 0 3 3   Sleep 3 -- -- 1 -- 3 2   Energy 3 -- -- 3 -- 3 1   Appetite 3 -- -- 1 -- 0 1   Failure 3 -- -- 1 -- 2 3   Concentration 3 -- -- 3 -- 3 3   Movement 3 -- -- 3 -- 2 0   Suicide 3 -- -- 0 -- 0 0   Summary(Manual) -- -- -- -- -- -- --   Summary(Calculated) 27 -- -- 18 -- 19 16   Functional Very difficult -- -- Somewhat difficult -- Very difficult Very difficult   Some recent data might be hidden           LABS AND IMAGING:    Lab Results   Component Value Date    RBC 4.25 04/07/2018    HGB 12.5 04/07/2018    HCT 37.1 04/07/2018    MCV 87.4 04/07/2018    MCHC 33.8 04/07/2018    RDW 16.1 (H) 04/07/2018    PLT 212 04/07/2018     Lab Results   Component Value Date    CHOL 105 04/07/2018    LDLCALC 51  04/07/2018    TRIG 83 04/07/2018     Lab Results   Component Value Date    A1C 8.3 (H) 05/08/2018    A1C 8.8 (H) 04/07/2018    A1C 6.6 (H) 07/06/2017     No results found for: GLUCPOCT  Lab Results   Component Value Date    K 4.1 05/08/2018    CL 108 (H) 05/08/2018    BUN 13 05/08/2018    CREAT 0.9 05/08/2018    GLU 137 (H) 05/08/2018    [place] 9.6 05/08/2018     Lab Results   Component Value Date    AST 40 (H) 05/08/2018    ALT 41 05/08/2018    ALK 63 05/08/2018    ALB 4.2 05/08/2018    TBILI 0.6 05/08/2018     No results found for: TSH  ASSESSMENT/PLAN:    1. Chronic Hepatitis C  Compensated. Asymptomatic. Long history of untreated HCV infection. Unclear source. RNA viral load >109M. Though LFT normal, concerning for development of hepatocellular carcinoma vs cirrhosis.   - Check CBC, AFP, RUQ [place], HCV Genotyping  - Check HAV, HBV antibodies. Vaccinate if not immune  - Avoid further hepatic insults with meds, supplements, including weight mgmt  - Refer to GI/Hepatology to start on anti-viral treatment    2. Type 2 diabetes mellitus without complication, without long-term current use of insulin (CMS-HCC)  A1c 8.3 on 05/08/2018, big jump from 08/2017. Normal thyroid function. Started on metformin 05/08/18.   - Increase metformin to max dose '1000mg'$  BID  - Counseled on exercise, diet modifications and weight loss  - Consult/referral to [employer] (Dietician referral, [facility])  - Recheck A1c on next visit in 2 months; if A1c > 7, recommend to start home BG checks (will need supplies), may consider adding GLP-1 at that time given weight loss benefits    3. Restless leg syndrome  - Continue ropinirole '4mg'$  daily  - F/u iron panel, ferritin; consider supplement if low on ferritin  - Advised pt to speak to psychiatrist given antipsychotics may worsen RLS    4. Easily bruising  INR/PT/PTT wnl. No h/o bleeding diathesis. No overt source of bleed.   - Check CBC (Plt)    5. Tobacco use disorder  - Patient  reports doing well with Step1 Nicotine patch (3 more boxes left)  - Congratulated patient on her commitment  - Continue 3-step nicotine patch treatment    6. Carotid stenosis, left  - Asymptomatic but given prior stroke from bilateral carotid stenosis, pt has been cautioned with symptoms of stroke. Strict ED precautions provided.  - FU with Vascular surgery for possible surgery    7. Bipolar disorder, in partial remission, most recent episode depressed (CMS-HCC)  8. Moderate episode of recurrent major depressive disorder (CMS-HCC)  9. Insomnia  Patient is taking seroquel '200mg'$  qHS for her bipolar and insomnia. However she states that her mood/depression is not well controlled. PHQ with moderate depression today. No SI/HI.   - Continue Seroquel  - Refer to [facility] for second opinion, add mood stabilizer? Reduce antipsychotics (in setting of worsening RLS)?  - Give [facility] contact if pt insists on changing outside psychiatrist      Return in about 4 weeks (around 06/19/2018) for f/u with Dr. [name] to establish care.    Future Appointments   Date Time Provider [place]   07/05/2018  1:30 PM [facility] DIETITIAN [place]   07/07/2018  1:40 PM [name], MD [place]   08/02/2018  8:40 AM [facility]       Patient was given strict return precautions should symptoms worsen or fail to improve for any concerns.    Orders Placed This Encounter   Procedures   . US Abdomen Limited   . Alpha Fetoprotein, Blood - See Instructions   . Hepatitis C Genotyping, Blood Yellow serum separator tube   . Hepatitis B Surface Ab, Blood   . Hepatitis A Total Antibody   . CBC w/ Diff [place]   . Consult/Referral to Gastroenterology   . Consult/referral to [place]

## 2018-05-22 NOTE — Patient Instructions (Addendum)
New PCP: Dr. Pricilla LovelessJenny Truong     - have your mammogram done  - have your ultrasound for your liver  - have your lab tests done    Please call authorization center at  [phone] if you do not hear from them in 1 week regarding your authorization for GI or liver specialist.     Future Appointments   Date Time Provider Department Center   08/02/2018  8:40 AM [facility] [place]

## 2018-05-23 ENCOUNTER — Telehealth: Payer: Self-pay

## 2018-05-23 ENCOUNTER — Other Ambulatory Visit
Admission: RE | Admit: 2018-05-23 | Discharge: 2018-05-23 | Disposition: A | Discharge status: Home / Self Care | Payer: No Typology Code available for payment source | Attending: Family Medicine | Admitting: Family Medicine

## 2018-05-23 DIAGNOSIS — R238 Other skin changes: Secondary | ICD-10-CM | POA: Insufficient documentation

## 2018-05-23 DIAGNOSIS — R233 Spontaneous ecchymoses: Secondary | ICD-10-CM

## 2018-05-23 DIAGNOSIS — B182 Chronic viral hepatitis C: Principal | ICD-10-CM | POA: Insufficient documentation

## 2018-05-23 LAB — CBC WITH DIFF, BLOOD
ANC automated: 5.7 10*3/uL (ref 2.0–8.1)
Basophils %: 0.4 %
Basophils Absolute: 0 10*3/uL (ref 0.0–0.2)
Eosinophils %: 1.1 %
Eosinophils Absolute: 0.1 10*3/uL (ref 0.0–0.5)
Hematocrit: 36.9 % (ref 34.0–44.0)
Hgb: 12.6 G/DL (ref 11.5–15.0)
Lymphocytes %: 30.9 %
Lymphocytes Absolute: 3 10*3/uL (ref 0.9–3.3)
MCH: 30 PG (ref 27.0–33.5)
MCHC: 34.2 G/DL (ref 32.0–35.5)
MCV: 87.8 FL (ref 81.5–97.0)
MPV: 9.6 FL (ref 7.2–11.7)
Monocytes %: 8.7 %
Monocytes Absolute: 0.8 10*3/uL (ref 0.0–0.8)
Neutrophils % (A): 58.9 %
PLT Count: 229 10*3/uL (ref 150–400)
RBC: 4.2 10*6/uL (ref 3.70–5.00)
RDW-CV: 15.1 % — ABNORMAL HIGH (ref 11.6–14.4)
White Bld Cell Count: 9.6 10*3/uL (ref 4.0–10.5)

## 2018-05-23 LAB — ALPHA FETOPROTEIN, BLOOD: AFP: 2.6 NG/ML (ref <9.0)

## 2018-05-23 NOTE — Telephone Encounter (Signed)
Regina Ward is a 57 year old female with multiple referral that require follow up. Attempted to relay information to patinet but she did not answer.    GI /Hepatology- Referral placed as routine. Will follow up at a later time.     Occupational Therapy- Referral placed as routine. Will follow up at a later time.     Mammogram- Patient has not completed imaging. Faxed patient facesheet and order to the following imaging center. Patient may call and schedule at the following location.    [facility]  [address]  [place], [place],  [id number]  T: [phone]  F: [phone]    Dietician- [facility] on 07/05/2018 at 1330.    [facility]- Patient information given to [facility] [place] to assist with coordinating appointment.    Labs- Patient arrived to labratory today (05/23/2018) to have multiple ordered blood tests completed.

## 2018-05-24 ENCOUNTER — Other Ambulatory Visit: Payer: Self-pay | Admitting: Student in an Organized Health Care Education/Training Program

## 2018-05-24 DIAGNOSIS — F17200 Nicotine dependence, unspecified, uncomplicated: Secondary | ICD-10-CM

## 2018-05-25 LAB — HEPATITIS A IGG: Hepatitis A Ab IgG: REACTIVE — AB

## 2018-05-25 LAB — HEPATITIS B SURFACE AB, BLOOD: Hepatitis B Surface Antibody Qualitative: NONREACTIVE

## 2018-05-27 ENCOUNTER — Encounter: Payer: Self-pay | Admitting: Student in an Organized Health Care Education/Training Program

## 2018-05-27 DIAGNOSIS — Z789 Other specified health status: Secondary | ICD-10-CM

## 2018-05-27 DIAGNOSIS — Z23 Encounter for immunization: Secondary | ICD-10-CM

## 2018-05-27 LAB — HEPATITIS C GENOTYPING, BLOOD

## 2018-05-27 NOTE — Progress Notes (Signed)
Please call patient re: lab results.    - your blood count is normal  - FYI, you have been exposed to Hep A in the past. Nothing to do.  - you are not immune to Hep B, this means in order to protect your liver, I recommend you get vaccinated for Hep B. I will order the shots and the MA can help you schedule RN visits for you to come in and get the three shots (at 0 months, 1-2 months, and 6 months).   - have you schedule an appt to see a Hepatologist (liver doctor) yet? (MA, pls help look up her referral status if she has not received any notification from her insurance yet.) Please remember to get a copy of your lab results on 05/08/18 and 05/23/18, and bring them to your first appt with your liver doctor. (MA, pls help coordinate so she can get her labs)  - Please keep your appt with Dr. Pricilla LovelessJenny Truong on 07/07/18 @ 1:40pm to establish care.   - Continue to keep up the good work you are doing toward quitting smoking! Don't forget to pick up step 2 once you are done with step 1 nicotine patches!   - Thank you once again for letting me take care of you these past couple years Renea Eevelyn! Be well.

## 2018-05-27 NOTE — Progress Notes (Signed)
Patient has chronic hep C with viral load in 57M. She is hepA immune but hepB non-immune. Hep B 3-dose vaccine series ordered. Msg to clinic pool to call pt and schedule RN visits for 0 months, 1-2 months and 6 months to complete series.

## 2018-06-07 NOTE — Telephone Encounter (Addendum)
Spoke with Regina Ward, 57 year old female, in regards to multiple referrals. Patient states she will call and schedule for the following referrals.     GI /Hepatology- Advised patinet of authorized gastroenterology referral. Patinet may call [phone] to schedule an appointment with Dr.Leduc's office.     Occupational Therapy- Attempted to speak to [place] the authorization coordinator for rehab services at [facility] but she was unavailable. Left a voicemail to have call returned for a follow up.    Mammogram- Patient has an appointment scheduled at [facility] on 07/05/2018.    Dietician- Appointment has been scheduled at [facility] on 07/10/2018 at 1100.    ABD Ultrasound- Patient was made aware imaging has not been completed. Faxed patient facesheet and order to the following imaging center. Provided patient with [facility] information to schedule an appointment.     [facility]  [address]  [place], [place],  [address]  T: [phone]  F: [phone]

## 2018-06-23 ENCOUNTER — Telehealth: Payer: Self-pay

## 2018-06-23 DIAGNOSIS — N644 Mastodynia: Secondary | ICD-10-CM

## 2018-06-23 NOTE — Telephone Encounter (Signed)
Followed up with the following orders. Attempted to relay the following information to Regina Ward, 57 year old female, but she did not answer call.     GI /Hepatology-Patient deferred to Dr.Bui [phone]. Patient may call to schedule an appointment.    Occupational Therapy-Insurance group was unable to extend the previous referral. New occupational therapy referral was place and currently in pending review.     Mammogram-Message sent to Dr.J.Truong and Dr.Tran to order bilateral diagnostic mammogram and bilateral ultrasound of breasts. Patient is scheduled at [facility] on 07/05/2018.    Dietician- Appointment has been scheduled at [facility] on 07/10/2018 at 1100.    ABD Ultrasound- Patient has not scheduled an appointment for the following procedure.

## 2018-06-23 NOTE — Addendum Note (Signed)
Addended by: Pricilla LovelessRUONG, Verdene Creson on: 06/23/2018 12:50 PM     Modules accepted: Orders

## 2018-06-23 NOTE — Telephone Encounter (Signed)
Per referral [phone]) note:  "Crystal from [facility] called stating patient is in pain and the doctor would like the routine Mammogram changed to a diagnostic and also would like to order an ultrasound. Her callback number is: [phone]"

## 2018-06-23 NOTE — Telephone Encounter (Signed)
Faxed cover page, face sheet and prescriptions for US Breast Complete - Bilateral/ Digital Diagnostic Mammogram Bilateral to [phone].

## 2018-06-23 NOTE — Telephone Encounter (Signed)
I have not met patient yet, but mammogram and ultrasound orders have been placed. Please let me know if there are any other requests from requesting physician.

## 2018-07-05 ENCOUNTER — Ambulatory Visit: Payer: No Typology Code available for payment source

## 2018-07-05 NOTE — Telephone Encounter (Signed)
Followed up with the following orders.     GI /Hepatology-Patient is scheduled on 08/03/2018 at 1530 with Dr.Bui T:[phone].     Occupational Therapy- currently in pending review.

## 2018-07-05 NOTE — Telephone Encounter (Signed)
Spoke to representative at [employer] [facility] [employer] Rep states the patient completed the bilateral ultrasound and mammogram. They mentioned the results will be available within 3-7 business days from today (07/05/2018).     Soon after, relayed information to Regina Ward, 57 year old female, and suggested she reschedule appointment due to result availability. Patient denied rescheduling appointment. No current questions or concerns.

## 2018-07-06 NOTE — Progress Notes (Addendum)
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 She is on seroquel which helps her with sleeping but does nothing for her mood  - She states she has a follow up appointment with current Psychiatrist in the upcoming week and our [facility] on 08/02/18  - Would not like to elaborate on her depression and stressors today as she would like to focus on her physical health  - Otherwise, denies thoughts of hurting self, suicidal ideation or homicidal ideation    # Bilateral breast pain  - Has been ongoing x6 months and previously evaluated here  - Described as soreness that comes and goes, spontaneously resolves without medications  - Recently had mammogram done 07/05/18, pending results    # Constipation  - Longstanding history for ~2 years  - Has been taking dulcolax at home with improvement of symptoms  - Denies hematochezia  - Has never had colonoscopy    # Tobacco  - Heavy smoker x40 years  - Highly motivated to quit, was previously smoking 1.5 packs per day and now down to 10 cigarettes  - Started nicotine patches on 04/2018, now on lowest dose of patches    # Yeast infection  - Patient states that she has had a yeast infection of right groin area x3 months and has been using nystatin cream x1 month with some improvement of rash  - States it feels irritated and quite  itchy  - Denies bleeding    [facility] 05/22/2018 05/08/2018 05/08/2018 04/14/2018 02/01/2018 10/25/2017 09/16/2017   PHQ2 Total 6 6 0 6 0 6 0   Some recent data might be hidden     Past Medical History:  Patient Active Problem List   Diagnosis   . Insomnia due to other mental disorder   . Bilateral carotid artery stenosis   . Schizophrenia (CMS-HCC)   . Stroke (cerebrum) (CMS-HCC)   . COPD with asthma (CMS-HCC)   . Depression   . Bipolar 1 disorder (CMS-HCC)   . Restless leg syndrome   . Posttraumatic stress disorder   . Type 2 diabetes mellitus without complication, without long-term current use of insulin (CMS-HCC)   . OSA (obstructive sleep apnea)   . History of uterine cancer   . Tobacco use disorder   . Morbid obesity with BMI of 40.0-44.9, adult (CMS-HCC)   . Chronic hepatitis C without hepatic coma (CMS-HCC)     Past Surgical History:  Past Surgical History:   Procedure Laterality Date   . THROMBOENDARTERECTOMY Right 12/2016    Carotd endarterectomy   . TOTAL ABDOMINAL HYSTERECTOMY W/ BILATERAL SALPINGOOPHORECTOMY  1993     Medications:  .  acetaminophen (TYLENOL) 500 MG tablet, Take 2 tablets (1,000 mg) by mouth every 8 hours as needed for Moderate Pain (Pain Score 4-6)., Disp: 30 tablet, Rfl: 0  .  aspirin 81 MG EC tablet, Take 1 tablet (81 mg) by mouth daily., Disp: 60 tablet, Rfl: 3  .  atorvastatin (LIPITOR) 80 MG tablet, Take 1 tablet (80 mg) by mouth nightly., Disp: 60 tablet, Rfl: 3  .  clopidogrel (PLAVIX) 75 MG tablet, Take 1 tablet (75 mg) by mouth daily., Disp: 60 tablet, Rfl: 3  .  cyclobenzaprine (FLEXERIL) 10 MG tablet, Take 1 tablet (10 mg) by mouth every 8 hours as needed for Muscle Spasms., Disp: 60 tablet, Rfl: 0  .  diclofenac (VOLTAREN) 1 % gel, Apply 1 g topically 4 times daily., Disp: 1 Tube, Rfl: 1  .  furosemide (LASIX) 20 MG tablet, Take 1 tablet (20 mg) by mouth daily., Disp: 60 tablet, Rfl: 3  .  hydrOXYzine HCL (ATARAX) 25 MG tablet, Take 25 mg by mouth 3 times daily as needed for Itching.,  Disp: , Rfl:   .  ibuprofen (MOTRIN) 600 MG tablet, take 1 tablet by mouth every 8 hours 7 days, Disp: , Rfl: 0  .  metFORMIN (GLUCOPHAGE) 1000 MG tablet, Take 1 tablet (1,000 mg) by mouth 2 times daily (with meals)., Disp: 120 tablet, Rfl: 3  .  nicotine (NICOTINE STEP 1) 21 MG/24HR patch, Step 1: apply 1 patch on skin every 24 hours for weeks 1-6 after stop smoking, Disp: 42 patch, Rfl: 0  .  nicotine (NICOTINE STEP 2) 14 MG/24HR, Step 2: apply 1 patch on skin every 24 hours for weeks 7-8 after stop smoking, Disp: 14 patch, Rfl: 0  .  nicotine (NICOTINE STEP 3) 7 MG/24HR, Step 3: apply 1 patch on skin every 24 hours for weeks 9-10 after stop smoking, Disp: 14 patch, Rfl: 0  .  nitroGLYcerin (NITROSTAT) 0.4 MG SL tablet, 1 tablet (0.4 mg) by Sublingual route every 5 minutes as needed for Chest Pain. up to 3 tabs per episode., Disp: 1 bottle, Rfl: 1  .  nystatin (MYCOSTATIN) 100000 UNIT/GM cream, Apply to irritated area four times a day, Disp: 30 g, Rfl: 2  .  potassium chloride (K-DUR) 10 MEQ Sustained-Release tablet, Take 1 tablet (10 mEq) by mouth daily., Disp: 60 tablet, Rfl: 3  .  QUEtiapine (SEROQUEL) 200 MG tablet, Take 1 tablet (200 mg) by mouth at bedtime., Disp: 60 tablet, Rfl: 2  .  ropinirole (REQUIP) 4 MG tablet, Take 1 tablet (4 mg) by mouth daily., Disp: 60 tablet, Rfl: 3  .  traMADol (ULTRAM) 50 MG tablet, Take 1 tablet (50 mg) by mouth every 6 hours as needed for Moderate Pain (Pain Score 4-6)., Disp: 28 tablet, Rfl: 0    Allergies:  No Known Allergies    FAMILY HISTORY:  Family History   Problem Relation Name Age of Onset   . Diabetes Mother          Leg amputation   . Stroke Father  66   . Heart Disease Brother     . Stroke Maternal Grandmother     . Heart Attack Maternal Grandmother       SOCIAL HISTORY:      reports that she has been smoking cigarettes.  She has a 61.50 pack-year smoking history. She has never used smokeless tobacco. She reports that she does not drink alcohol or use  drugs.    Review of Systems:  A 10 system ROS was performed and is negative except as above in HPI    Objective:     BP 128/72 (BP Location: Left arm, BP Patient Position:  Sitting, BP cuff size: Regular)   Pulse 83   Temp 98.3 F (36.8 C)   Resp 16   Ht _0  (1.575 m)   Wt 93.5 kg (206 lb 2.1 oz)   LMP  (LMP Unknown)   BMI 37.70 kg/m     Physical Exam:  Well developed, in NAD. Conversant, good eye contact. Obese  Heart: regular rate and rhythm. S1 and S2 intensities normal. No murmurs, rubs or gallops.   Chest: diminished breath sounds at bilateral bases. No wheezes, rales or rhonchi  Abdomen: soft, nontender, nondistended. Positive bowel sounds. No rebound, no guarding.   Extremities: no peripheral edema  Skin: large hyperpigmented rash in right groin below pannus with few satellite lesions, no purulence, bleeding or discharge  Diabetic Foot Exam: no areas of erythema, ulceration, or fissuring; pedal pulses 2+, normal cap refill, intact to monofilament testing   Neuro/MSK: 4-/5 strength in left upper extremity (at baseline), otherwise 5/5 strength in all other extremities      Labs and Imaging:  Lab Results   Component Value Date    WBCCOUNT 9.6 05/23/2018    HGB 12.6 05/23/2018    HCT 36.9 05/23/2018    PLT 229 05/23/2018     Lab Results   Component Value Date    SODIUM 139 05/08/2018    K 4.1 05/08/2018    CL 108 (H) 05/08/2018    CO2 27 05/08/2018    BUN 13 05/08/2018    CREAT 0.9 05/08/2018    GFR >60 05/08/2018    GLU 137 (H) 05/08/2018     Lab Results   Component Value Date    TPROT 7.2 05/08/2018    ALB 4.2 05/08/2018    ALK 63 05/08/2018    ALT 41 05/08/2018    AST 40 (H) 05/08/2018    TBILI 0.6 05/08/2018     Lab Results   Component Value Date    CHOL 105 04/07/2018    TRIG 83 04/07/2018    HDLCH2 37 (L) 04/07/2018    LDLCALC 51 04/07/2018     Lab Results   Component Value Date    A1C 8.3 (H) 05/08/2018      Bilateral Digital Diagnostic Mammography 07/05/18  Findings:   There are scattered  fibroglandular densities. No suspicious calcifications are identified. No dominant mass lesions are seen. There is no evidence of nipple retraction. No mammographic evidence of malignancy is identified. Coarse benign-appearing calcifications right axilla.BB was placed on an area of concern indicated by the patient on the left. No obvious abnormality is seen in this region  Impression:  No mammographic evidence of malignancy is identified. BI-RADS Category 2 - benign findings    Bilateral Breast Ultrasound 07/05/18  Findings:  No suspicious sonographic abnormality is identified in the right or left breast. No sonographic abnormality is seen. Any decision to biopsy a palpable abnormality should be based on clinical findings including physical examination.  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 Unclear why she is on lasix.  She denies history of heart failure or leg swelling and does not remember who prescribed to her or why. Will advise to discontinue today    # Chronic hepatitis C without hepatic coma (CMS-HCC)  Asymptomatic. Compensated. Longstanding history of untreated HCV infection. Unclear source. Significant work up includes:    - HepC viral load (05/08/18) >2 million     - HepC genotype (05/23/18) 1a    - HepA IgG (05/23/18) immune    - HepB Surface Ab (05/23/18) NR    - AFP (05/23/18) 2.6 normal range    _0  RUQ [place] - ordered 05/22/18, gave patient information today regarding how to schedule  - Per patient, was referred to  outside GI named Dr. Jomarie Longs. Will follow up within the next month  - Start HepB 3-dose series today    # Type 2 diabetes mellitus without complication, without long-term current use of insulin (CMS-HCC)  Last A1c 8.3 (05/08/18). Goal A1c <7. Last LDL 51 (04/07/18)  - Continue metformin 1059m BID  - Will recheck A1c today and consider adding secondary agent such as GLP-1 if still elevated  - Microvascular screening:    _1  Eye Exam: referral to ophthalmology today    _2  Foot Exam: (07/07/18) normal monofilament    _3  Urine microalbumin/creatinine ratio: 4.5 (04/07/18)  - Return in 1 month to follow up   Orders  - Glycosylated Hgb(A1C), Blood Lavender; Future  - Consult/Referral to Ophthalmology Clinic; Future    # Bilateral carotid artery stenosis  # Cerebrovascular accident (CVA) due to bilateral stenosis of carotid arteries (CMS-HCC)  History of bilateral carotid artery stenosis 12/2017 leading to stroke, s/p R thromboendarterectomy. Saw vascular surgery 06/2018. Has follow up this month. States they will review ultrasound results with her and have further recommendations at that visit.  - In addition, patient has expressed grief regarding how difficult it has been at home in terms of performing activities of daily living (grocery shopping, house chores, dressing self) given the residual left arm weakness after her stroke. She is curious regarding the available resources that may be available to her. Will refer to Social Work to assess what is available (for example in home support services, etc). Writer of note will gladly assist with physician portion of paperwork regarding the support she may want to pursue in future  Orders  - Consult/referral to [facility]    # Intertrigo  Right groin area below pannus.  - terbinafine (LAMISIL) 1 % cream; Apply 1 Application topically 2 times daily. Use a small amount as directed until resolution  Dispense: 42 g; Refill: 3    # Slow transit constipation  -  bisacodyl (DULCOLAX) 5 MG Enteric-Coated tablet; Take 1 tablet (5 mg) by mouth daily as needed for Constipation.  Dispense: 60 tablet; Refill: 3    # Tobacco use disorder  Patient is now on Step 3 for nicotine patch. Has reduced to 10 cigarettes per day compared to 1.5 packs per day. Continue patch  Orders  - CT Chest W/O- low dose (lung cancer screening); Future    # COPD with asthma (CMS-HCC)  Diminished breath sounds on bilateral bases on auscultation today.  - Will order Chest X-Ray to evaluate for acute process  - Previously on inhaler and never performed PFTs  - PFT order today  - Albuterol PRN and tiotropium for daily maintenance. Will adjust inhalers pending PFT results  Orders  - X-Ray Chest PA; Future  -  tiotropium (SPIRIVA HANDIHALER) 18 MCG inhalation capsule; Inhale 1 capsule (18 mcg) by mouth daily.  Dispense: 30 capsule; Refill: 3  - albuterol 108 (90 Base) MCG/ACT inhaler; Inhale 2 puffs by mouth every 6 hours as needed for Wheezing.  Dispense: 1 Inhaler; Refill: 3  - spacer (AEROCHAMBER) MISC; Use as directed  Dispense: 1 each; Refill: 3  - Pulmonary Function Test - Pulmonary Lab; Future    # Bipolar disorder, in partial remission, most recent episode depressed  # Moderate episode of recurrent major depressive disorder   # Insomnia  Currently on seroquel 249m QHS. States mood/depression is not well controlled. Denies SI/HI.  - [facility], potentially adding mood stabilizer or reducing antipsychotics in setting of worsening restless leg syndrome    # Routine general medical examination at a health care facility  # Encounter for colorectal cancer screening  # Encounter for screening for lung cancer  # Need for vaccination  _0  Vaccines: Tdap 04/14/18, HepA immune 05/23/18, influenza 06/23/18      -- Will administer PPSV23 today      -- Will administer first Hep B dose today. Return in 1 month for 2nd dose and 6  months for 3rd      -- Shingrix prescription sent to patient's preferred pharmacy  _1  Colorectal cancer screen: FIT 07/11/17 negative, colonoscopy referral today  _2  WWE: s/p TAH with BSO, last pap 04/14/18 NILM/HPV neg      -- Mammo 07/05/18 BI-RADS 2 without evidence of malignancy. Results were unavailable while she was in clinic. Called patient after visit and left voice message. Otherwise, can review results when she returns for follow up in 1 month  _3  Lung Cancer screening: low-dose CT Chest ordered today  _4  Eye Exam: unknown, ophthalmology referral today  _5  Dental Visit: 07/2018    Orders  - Colonoscopy Screening; Future  - Hepatitis B (Recombivax, Engerix-B)  - Pneumococcal (PNEUMOVAX-23)  - herpes zoster recombinant adjuvanted vaccine (SHINGRIX) 50 MCG/0.5ML IM injection; 0.5 mL by IntraMUSCULAR route once for 1 dose. First dose - 1 out of 2 dose series  Dispense: 1 each; Refill: 0  - herpes zoster recombinant adjuvanted vaccine (SHINGRIX) 50 MCG/0.5ML IM injection; 0.5 mL by IntraMUSCULAR route once for 1 dose. This is the 2nd dose. Please administer 2-6 months from the first dose.  Dispense: 1 each; Refill: 0  - Consult/Referral to Ophthalmology Clinic; Future  - CT Chest W/O- low dose (lung cancer screening); Future    # Morbid obesity - BMI 37.0-37.9, adult  # Nutritional counseling    Follow Up: Return in about 1 month (around 08/07/2018) for follow up results.    Patient seen and discussed with attending, Dr. HSilverio Decamp

## 2018-07-07 ENCOUNTER — Encounter: Payer: Self-pay | Admitting: Family Medicine

## 2018-07-07 ENCOUNTER — Ambulatory Visit: Payer: No Typology Code available for payment source | Attending: Family Medicine | Admitting: Family Medicine

## 2018-07-07 VITALS — BP 128/72 | HR 83 | Temp 98.3°F | Resp 16 | Ht 62.0 in | Wt 206.1 lb

## 2018-07-07 DIAGNOSIS — E119 Type 2 diabetes mellitus without complications: Secondary | ICD-10-CM | POA: Insufficient documentation

## 2018-07-07 DIAGNOSIS — J449 Chronic obstructive pulmonary disease, unspecified: Secondary | ICD-10-CM | POA: Insufficient documentation

## 2018-07-07 DIAGNOSIS — Z713 Dietary counseling and surveillance: Secondary | ICD-10-CM | POA: Insufficient documentation

## 2018-07-07 DIAGNOSIS — F331 Major depressive disorder, recurrent, moderate: Secondary | ICD-10-CM | POA: Insufficient documentation

## 2018-07-07 DIAGNOSIS — L304 Erythema intertrigo: Secondary | ICD-10-CM | POA: Insufficient documentation

## 2018-07-07 DIAGNOSIS — Z1212 Encounter for screening for malignant neoplasm of rectum: Secondary | ICD-10-CM | POA: Insufficient documentation

## 2018-07-07 DIAGNOSIS — F17200 Nicotine dependence, unspecified, uncomplicated: Secondary | ICD-10-CM | POA: Insufficient documentation

## 2018-07-07 DIAGNOSIS — Z0000 Encounter for general adult medical examination without abnormal findings: Secondary | ICD-10-CM | POA: Insufficient documentation

## 2018-07-07 DIAGNOSIS — I6523 Occlusion and stenosis of bilateral carotid arteries: Secondary | ICD-10-CM | POA: Insufficient documentation

## 2018-07-07 DIAGNOSIS — B182 Chronic viral hepatitis C: Secondary | ICD-10-CM | POA: Insufficient documentation

## 2018-07-07 DIAGNOSIS — Z7189 Other specified counseling: Secondary | ICD-10-CM | POA: Insufficient documentation

## 2018-07-07 DIAGNOSIS — K5901 Slow transit constipation: Secondary | ICD-10-CM | POA: Insufficient documentation

## 2018-07-07 DIAGNOSIS — Z6837 Body mass index (BMI) 37.0-37.9, adult: Secondary | ICD-10-CM | POA: Insufficient documentation

## 2018-07-07 DIAGNOSIS — Z122 Encounter for screening for malignant neoplasm of respiratory organs: Secondary | ICD-10-CM | POA: Insufficient documentation

## 2018-07-07 DIAGNOSIS — Z1211 Encounter for screening for malignant neoplasm of colon: Secondary | ICD-10-CM | POA: Insufficient documentation

## 2018-07-07 DIAGNOSIS — I63233 Cerebral infarction due to unspecified occlusion or stenosis of bilateral carotid arteries: Secondary | ICD-10-CM | POA: Insufficient documentation

## 2018-07-07 DIAGNOSIS — Z23 Encounter for immunization: Secondary | ICD-10-CM | POA: Insufficient documentation

## 2018-07-07 MED ORDER — ZOSTER VAC RECOMB ADJUVANTED 50 MCG/0.5ML IM SUSR
0.50 mL | Freq: Once | INTRAMUSCULAR | 0 refills | Status: AC
Start: 2018-07-07 — End: 2018-07-07

## 2018-07-07 MED ORDER — BISACODYL EC 5 MG OR TBEC
5.0000 mg | DELAYED_RELEASE_TABLET | Freq: Every day | ORAL | 3 refills | Status: DC | PRN
Start: 2018-07-07 — End: 2018-09-18

## 2018-07-07 MED ORDER — SPACER/AERO CHAMBER MOUTHPIECE MISC (CUSTOM)
3 refills | Status: DC
Start: 2018-07-07 — End: 2019-10-02

## 2018-07-07 MED ORDER — TERBINAFINE HCL 1 % EX CREA
1.0000 | TOPICAL_CREAM | Freq: Two times a day (BID) | CUTANEOUS | 3 refills | Status: DC
Start: 2018-07-07 — End: 2019-02-15

## 2018-07-07 MED ORDER — TIOTROPIUM BROMIDE MONOHYDRATE 18 MCG IN CAPS
18.0000 ug | ORAL_CAPSULE | Freq: Every day | RESPIRATORY_TRACT | 3 refills | Status: DC
Start: 2018-07-07 — End: 2018-09-18

## 2018-07-07 MED ORDER — ALBUTEROL SULFATE 108 (90 BASE) MCG/ACT IN AERS
2.0000 | INHALATION_SPRAY | Freq: Four times a day (QID) | RESPIRATORY_TRACT | 3 refills | Status: DC | PRN
Start: 2018-07-07 — End: 2018-09-18

## 2018-07-07 NOTE — Patient Instructions (Addendum)
-   Please call this location to set up an appointment to get an ultrasound of your liver  - Schedule Chest X-Ray and CT of Chest  - Stop taking lasix/furosemide and nystatin cream  - Start taking the lamisil/terbinafine    [facility]  [address]  [place], [place],  [address]  T: [phone]  F: [phone]    Please call or return to the clinic or going to the Emergency Room if you have new, persistent, or worsening symptoms.    - We recommend taking all medications as directed. Prescriptions and refills are sent electronically to your pharmacy.  - It is very important to bring an updated list of all your medications with you to any hospital or doctor's visit.  - If you go to an Emergency Room, hospital, or another doctor/consultant, please request your records to be forwarded to our office so that we can stay updated on your health.  - Please call our clinic at [phone] for any questions or concerns, including making/canceling/rescheduling appointments, requesting medication refills, leaving a message for your doctor. We will try our best to accommodate you in a timely manner.  - For questions regarding referral authorizations, please call [phone].  - Set up your patient portal to access your test results, request appointments/prescriptions: [email or web address]      TIPS on how to live a longer, healthier life.   1. Drink 2 Liters (about 8 cups) of water a day. Drink 1 cup of water before each meal. If you hate water, drink green tea. Avoid sugary drink and alcohol. Limit to 1 glass of wine a day.   2. Limit salt intake to 2 grams a day, season food with herbs and spices, like garlic, cinnamon, curry, and ginger.   3. Eat 5-10 servings of fruits and vegetables a day. Eat fruits after each meals, instead of dessert.   4. Eat healthy fats, like olive oil, avocado, dark chocolate. Eat a handful of nuts (almond, walnut, cashews, peanuts) daily or legumes (hummus or  lentil soup).   5. Eat fish, eggs, lean meat like chicken. Limit red meat like beef and pork to only special occasions.   6. Eat health dairy like greek yogurt, avoid cheese. Avoid sugary dessert.   7. AVOID processed foods and simple, refined carbs like white bread, flour tortilla, white rice, flour pasta, instead eat whole-wheat bread, brown rice, wheat tortilla.   8. Think positive, do not dwell on the negatives. Get 7-9 hours of sleep daily. Exercise 30-60 minutes of cardiovascular activity a day. If your joints hurt, do low-impact exercises like walking, swimming, station bikes, and elliptical. If your joints are healthy, you can do running, stair-master, play sports, and do weight-training.   9. Everything in moderation. Portion control. Eat only till you are 3/4 full. Your brain takes 30 minutes to get signals from your stomach that you are full. If you are still hungry after a meal, eat some fruits/nuts.     Take good care,  Regina Ward  [facility] in [place], [place]    If you have any questions that cannot wait until the next day during business hours, please call [phone]    Si tiene cualquier pregunta que no puede esperar al siguiente dia durante el horario de [place], por favor llame: [phone]

## 2018-07-07 NOTE — Progress Notes (Signed)
56 y/o female    1) H/O mult mental health dz  Depression and insomnia  No SI/HI  Followed by psychiatry      2)  H/O CVA  S/P right carotid endarterectomy  Left carotid bruit  ASA and plavix   Statin   Followed by vascular surgery    3)  H/O HCV  Has an appt with GI  Abd U/S pending  HBV vaccine #1    4)  Chronic constipation  FIT neg last year  TSH 1.6  [place] 9.6  GI for possible colonoscopy         5)  Tinea in inguinal and suprapubic areas  Nystatin once daily - better but has not resolved  Change Nystatin to terbinafine     6)  H/O COPD  Decreased CTA at base  No cough    CXR and PFP  Tiotropium and albuterol    7)  Tobacco use  Nicotine patch -> less cigarettes now        8)  H/O DM   HbA1c 8.3  Negative urine microalbumin 7/19  Metformin increased recently  Recheck HbA1c  Pneumococcal vaccine  Flu vaccine already  Ophthal    Seen with the resident

## 2018-07-07 NOTE — Assessment & Plan Note (Signed)
-   Per patient, was referred to outside GI named Dr. Daivd Council. Will follow up within the next month  - Start HepB 3-dose series today

## 2018-07-10 ENCOUNTER — Ambulatory Visit: Payer: No Typology Code available for payment source

## 2018-07-10 ENCOUNTER — Other Ambulatory Visit
Admission: RE | Admit: 2018-07-10 | Discharge: 2018-07-10 | Disposition: A | Discharge status: Home / Self Care | Payer: No Typology Code available for payment source | Attending: Family Medicine | Admitting: Family Medicine

## 2018-07-10 DIAGNOSIS — E119 Type 2 diabetes mellitus without complications: Principal | ICD-10-CM | POA: Insufficient documentation

## 2018-07-11 LAB — GLYCOSYLATED HGB(A1C), BLOOD: Glycated Hgb, A1C: 6.4 % — ABNORMAL HIGH (ref 4.6–5.6)

## 2018-07-12 NOTE — Progress Notes (Signed)
Please call and mail result to patient. She has done a phenomenal job on living a healthier lifestyle. Her A1c is now down to 6.4 compared to 8.3 (just 2 months ago). She has follow up with me on 08/17/18 and we can further discuss at that appointment

## 2018-07-18 ENCOUNTER — Ambulatory Visit: Payer: No Typology Code available for payment source

## 2018-07-19 ENCOUNTER — Telehealth: Payer: Self-pay

## 2018-07-19 NOTE — Telephone Encounter (Signed)
Called to schedule pulmonary function testing. Directions and instructions given to pt.

## 2018-07-20 ENCOUNTER — Telehealth: Payer: Self-pay | Admitting: Family Medicine

## 2018-07-20 NOTE — Telephone Encounter (Signed)
Maria form [id number] - [place] - [address] [phone] needs a prior authorization on patients inhaler. The name of the inhaler is Spiriva 18 MCG Inhaler.  Please assist. Thank you

## 2018-07-21 NOTE — Telephone Encounter (Signed)
Forward message to  Dr. Truong J. For review

## 2018-07-21 NOTE — Telephone Encounter (Signed)
Will forward to MA pool to assist with obtaining the prior authorization form. Was not in my mailbox on 07/21/2018 1:18 PM.    Thank you for assistance.    Pricilla Loveless, MD   [facility]

## 2018-07-21 NOTE — Telephone Encounter (Signed)
PA FORM GIVEN TO DR. Irving Burton. FOR REVIEW

## 2018-07-24 ENCOUNTER — Ambulatory Visit: Payer: No Typology Code available for payment source

## 2018-07-24 NOTE — Telephone Encounter (Signed)
PA FORM FAXED

## 2018-07-25 ENCOUNTER — Ambulatory Visit
Admission: RE | Admit: 2018-07-25 | Discharge: 2018-07-25 | Disposition: A | Discharge status: Home / Self Care | Payer: No Typology Code available for payment source | Attending: Diagnostic Radiology | Admitting: Diagnostic Radiology

## 2018-07-25 DIAGNOSIS — F17210 Nicotine dependence, cigarettes, uncomplicated: Secondary | ICD-10-CM

## 2018-07-25 DIAGNOSIS — J438 Other emphysema: Secondary | ICD-10-CM | POA: Insufficient documentation

## 2018-07-25 DIAGNOSIS — J8410 Pulmonary fibrosis, unspecified: Secondary | ICD-10-CM | POA: Insufficient documentation

## 2018-07-25 DIAGNOSIS — Z122 Encounter for screening for malignant neoplasm of respiratory organs: Principal | ICD-10-CM | POA: Insufficient documentation

## 2018-07-25 DIAGNOSIS — K449 Diaphragmatic hernia without obstruction or gangrene: Secondary | ICD-10-CM | POA: Insufficient documentation

## 2018-07-25 DIAGNOSIS — F17200 Nicotine dependence, unspecified, uncomplicated: Secondary | ICD-10-CM | POA: Insufficient documentation

## 2018-07-25 DIAGNOSIS — M8580 Other specified disorders of bone density and structure, unspecified site: Secondary | ICD-10-CM | POA: Insufficient documentation

## 2018-07-25 DIAGNOSIS — I2510 Atherosclerotic heart disease of native coronary artery without angina pectoris: Secondary | ICD-10-CM | POA: Insufficient documentation

## 2018-07-25 NOTE — Progress Notes (Signed)
07/25/2018 1:08 PM     Received Chest X-Ray Results. No acute process. Plan to review with patient at follow up appointment on 08/17/18.    Chest X-Ray 100/22/19  FINDINGS:  - Cardiac: The cardiac silhouette is enlarged.  - Mediastinum: Within normal limits.  - Pulmonary: The lungs are clear bilaterally. There is no consolidation, pleural effusion or pneumothorax. The pulmonary vasculature is within normal limits.  - Osseous: Negative for acute fracture.  - Soft tissues: Negative for acute abnormality.  IMPRESSION:  - Enlarged cardiac silhouette

## 2018-07-25 NOTE — Progress Notes (Signed)
Please call and send a letter to the patient with normal results. Thank you. Can further discuss if she has questions at 08/17/18 follow up appointment. Regina Ward

## 2018-07-26 NOTE — Telephone Encounter (Addendum)
Followed up with the following orders. 2nd and final attempt to relay the following information to Regina Ward, 58 year old female, but she did not answer call.     GI /Hepatology-Patient is scheduled on 08/03/2018 at 1530 with Dr.Bui T:[phone].     Occupational Therapy-authorization #[id number]  [facility].  T:[phone].  Relayed authorization information to the following clinic and representative states they will reach out to the patient to assist in scheduling an appointment.     Mammogram-bilateral diagnostic mammogram and bilateral ultrasound of breasts available in epic "media" on 07/11/2018 date.    Dietician- patient re-scheduled [facility] dietician appointment from 07/10/2018 to 10/11/2018.    ABD Ultrasound-Patient completed abdominal ultrasound at [facility] on 07/25/2018.

## 2018-07-26 NOTE — Telephone Encounter (Signed)
Mammogram-bilateral diagnostic mammogram and bilateral ultrasound of breasts available in epic "media" on 07/11/2018 date.

## 2018-07-27 ENCOUNTER — Other Ambulatory Visit: Payer: Self-pay | Admitting: Student in an Organized Health Care Education/Training Program

## 2018-08-01 ENCOUNTER — Telehealth: Payer: Self-pay

## 2018-08-01 NOTE — Telephone Encounter (Signed)
Patient is scheduled for Pulmonary Function test for Friday and she would like instructions.  Stated she forgot what they told her to do what medication she needs to stop.  Please contact her.  Thank you

## 2018-08-02 ENCOUNTER — Ambulatory Visit: Payer: No Typology Code available for payment source | Attending: Psychiatry

## 2018-08-02 VITALS — BP 135/65 | HR 61 | Temp 98.3°F | Resp 14 | Wt 206.1 lb

## 2018-08-02 DIAGNOSIS — F251 Schizoaffective disorder, depressive type: Secondary | ICD-10-CM | POA: Insufficient documentation

## 2018-08-02 DIAGNOSIS — F4310 Post-traumatic stress disorder, unspecified: Secondary | ICD-10-CM | POA: Insufficient documentation

## 2018-08-02 DIAGNOSIS — Z6837 Body mass index (BMI) 37.0-37.9, adult: Secondary | ICD-10-CM | POA: Insufficient documentation

## 2018-08-02 MED ORDER — CHLORHEXIDINE GLUCONATE 0.12 % MT SOLN
OROMUCOSAL | 0 refills | Status: DC
Start: 2018-06-30 — End: 2019-08-22

## 2018-08-02 MED ORDER — ACETAMINOPHEN-CODEINE #3 300-30 MG OR TABS
1.00 | ORAL_TABLET | Freq: Four times a day (QID) | ORAL | 0 refills | Status: DC | PRN
Start: 2018-06-30 — End: 2018-09-18

## 2018-08-02 MED ORDER — IBUPROFEN 800 MG OR TABS
800.00 mg | ORAL_TABLET | Freq: Four times a day (QID) | ORAL | 0 refills | Status: DC | PRN
Start: 2018-06-30 — End: 2018-09-18

## 2018-08-02 MED ORDER — AEROCHAMBER PLUS FLO-VU MISC
0 refills | Status: DC
Start: 2018-07-07 — End: 2019-07-23

## 2018-08-02 MED ORDER — QUETIAPINE FUMARATE 300 MG OR TABS
300.00 mg | ORAL_TABLET | Freq: Every evening | ORAL | 0 refills | Status: DC
Start: 2018-07-12 — End: 2018-10-24

## 2018-08-02 MED ORDER — QUETIAPINE FUMARATE 100 MG OR TABS
100.00 mg | ORAL_TABLET | Freq: Every morning | ORAL | 0 refills | Status: DC
Start: 2018-07-12 — End: 2018-10-24

## 2018-08-02 NOTE — Patient Instructions (Signed)
Please return to clinic if not better or call with any concerns or questions.  If you have any questions that cannot wait until the next day during business hours, please call [phone]    Por favor regrese a la clinica si no mejora o empeora. Tambien puede llamarnos con cualquier pregunta o preocupacion.   Si tiene cualquier pregunta que no puede esperar al siguiente dia durante el horario de trabajo, por favor llame: [phone]
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
## 2018-08-03 NOTE — Addendum Note (Signed)
Addended byRadonna Ricker on: 08/03/2018 08:57 AM     Modules accepted: Orders, Level of Service

## 2018-08-04 ENCOUNTER — Ambulatory Visit
Admission: RE | Admit: 2018-08-04 | Discharge: 2018-08-04 | Disposition: A | Discharge status: Home / Self Care | Payer: No Typology Code available for payment source | Attending: Family Medicine | Admitting: Family Medicine

## 2018-08-04 DIAGNOSIS — J449 Chronic obstructive pulmonary disease, unspecified: Principal | ICD-10-CM | POA: Insufficient documentation

## 2018-08-04 MED ORDER — ALBUTEROL SULFATE (2.5 MG/3ML) 0.083% IN NEBU
2.50 mg | INHALATION_SOLUTION | Freq: Once | RESPIRATORY_TRACT | Status: AC
Start: 2018-08-04 — End: 2018-08-04
  Administered 2018-08-04 (×2): 2.5 mg via RESPIRATORY_TRACT

## 2018-08-16 DIAGNOSIS — Z0000 Encounter for general adult medical examination without abnormal findings: Secondary | ICD-10-CM | POA: Insufficient documentation

## 2018-08-16 NOTE — Progress Notes (Signed)
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    # Right hand pain  - Started 3 months ago   - Worse at night/end of the day  - She endorses entire right hand (no joint pain)  - Described as arthritic pain, "but real bad"  - Associated with numbness and tingling  - Denies any strenuous activity or repetitive action with hand    [facility] 05/22/2018 05/08/2018 05/08/2018 04/14/2018 02/01/2018 10/25/2017 09/16/2017   PHQ2 Total 6 6 0 6 0 6 0   Some recent data might be hidden     Past Medical History:  Patient Active Problem List   Diagnosis   . Insomnia due to other mental disorder   . Bilateral carotid artery stenosis   . Schizophrenia (CMS-HCC)   . Stroke (cerebrum) (CMS-HCC)   . COPD with asthma (CMS-HCC)   . Depression   . Bipolar 1 disorder (CMS-HCC)   . Restless leg syndrome   . Posttraumatic stress disorder   . Type 2 diabetes mellitus without complication, without long-term current use of insulin (CMS-HCC)   . OSA (obstructive sleep apnea)   . History of uterine cancer   . Tobacco use disorder   . Morbid obesity with BMI of 40.0-44.9, adult (CMS-HCC)   . Chronic hepatitis C without hepatic coma (CMS-HCC)   . Routine health maintenance     Past Surgical History:  Past Surgical History:   Procedure Laterality Date   . THROMBOENDARTERECTOMY Right 12/2016    Carotd endarterectomy   . TOTAL ABDOMINAL HYSTERECTOMY W/ BILATERAL  SALPINGOOPHORECTOMY  1993     Medications:  .  acetaminophen-codeine (TYLENOL #3) 300-30 MG tablet, Take 1 tablet by mouth every 6 hours as needed., Disp: , Rfl: 0  .  albuterol 108 (90 Base) MCG/ACT inhaler, Inhale 2 puffs by mouth every 6 hours as needed for Wheezing., Disp: 1 Inhaler, Rfl: 3  .  aspirin 81 MG EC tablet, Take 1 tablet (81 mg) by mouth daily., Disp: 60 tablet, Rfl: 3  .  atorvastatin (LIPITOR) 80 MG tablet, Take 1 tablet (80 mg) by mouth nightly., Disp: 60 tablet, Rfl: 3  .  bisacodyl (DULCOLAX) 5 MG Enteric-Coated tablet, Take 1 tablet (5 mg) by mouth daily as needed for Constipation., Disp: 60 tablet, Rfl: 3  .  chlorhexidine (PERIDEX) 0.12 % solution, Rinse with 1/2 ounce by mouth for 30 seconds then spit out. use twice a day for 7 days, Disp: , Rfl: 0  .  clopidogrel (PLAVIX) 75 MG tablet, Take 1 tablet (75 mg) by mouth daily., Disp: 60 tablet, Rfl: 3  .  cyclobenzaprine (FLEXERIL) 10 MG tablet, Take 1 tablet (10 mg) by mouth every 8 hours as needed for Muscle Spasms., Disp: 60 tablet, Rfl: 0  .  diclofenac (VOLTAREN) 1 % gel, Apply 1 g topically 4 times daily., Disp: 1 Tube,  Rfl: 1  .  hydrOXYzine HCL (ATARAX) 25 MG tablet, Take 25 mg by mouth 3 times daily as needed for Itching., Disp: , Rfl:   .  ibuprofen (MOTRIN) 800 MG tablet, Take 800 mg by mouth every 6 hours as needed., Disp: , Rfl: 0  .  metFORMIN (GLUCOPHAGE) 1000 MG tablet, Take 1 tablet (1,000 mg) by mouth 2 times daily (with meals)., Disp: 120 tablet, Rfl: 3  .  nicotine (NICOTINE STEP 1) 21 MG/24HR patch, Step 1: apply 1 patch on skin every 24 hours for weeks 1-6 after stop smoking, Disp: 42 patch, Rfl: 0  .  nicotine (NICOTINE STEP 2) 14 MG/24HR, Step 2: apply 1 patch on skin every 24 hours for weeks 7-8 after stop smoking, Disp: 14 patch, Rfl: 0  .  nicotine (NICOTINE STEP 3) 7 MG/24HR, Step 3: apply 1 patch on skin every 24 hours for weeks 9-10 after stop smoking, Disp: 14 patch, Rfl: 0  .  nitroGLYcerin (NITROSTAT) 0.4 MG SL  tablet, 1 tablet (0.4 mg) by Sublingual route every 5 minutes as needed for Chest Pain. up to 3 tabs per episode., Disp: 1 bottle, Rfl: 1  .  potassium chloride (K-DUR) 10 MEQ Sustained-Release tablet, Take 1 tablet (10 mEq) by mouth daily., Disp: 60 tablet, Rfl: 3  .  QUEtiapine (SEROQUEL) 100 MG tablet, Take 100 mg by mouth every morning., Disp: , Rfl: 0  .  QUEtiapine (SEROQUEL) 300 MG tablet, Take 300 mg by mouth at bedtime., Disp: , Rfl: 0  .  ropinirole (REQUIP) 4 MG tablet, Take 1 tablet (4 mg) by mouth daily., Disp: 60 tablet, Rfl: 3  .  spacer (AEROCHAMBER) MISC, Use as directed, Disp: 1 each, Rfl: 3  .  Spacer/Aero-Holding Chambers (AEROCHAMBER PLUS FLO-VU) MISC, As Directed., Disp: , Rfl: 0  .  terbinafine (LAMISIL) 1 % cream, Apply 1 Application topically 2 times daily. Use a small amount as directed until resolution, Disp: 42 g, Rfl: 3  .  tiotropium (SPIRIVA HANDIHALER) 18 MCG inhalation capsule, Inhale 1 capsule (18 mcg) by mouth daily., Disp: 30 capsule, Rfl: 3    Allergies:  No Known Allergies    FAMILY HISTORY:  Family History   Problem Relation Name Age of Onset   . Diabetes Mother          Leg amputation   . Stroke Father  42   . Heart Disease Brother     . Stroke Maternal Grandmother     . Heart Attack Maternal Grandmother       SOCIAL HISTORY:      reports that she has been smoking cigarettes. She has a 61.50 pack-year smoking history. She has never used smokeless tobacco. She reports that she does not drink alcohol or use drugs.    Review of Systems:  A 10 system ROS was performed and is negative except as above in HPI    Objective:     BP 144/65 (BP Location: Right arm, BP Patient Position: Sitting, BP cuff size: Large)   Pulse 63   Temp 98.5 F (36.9 C) (Oral)   Resp 14   Wt 92.3 kg (203 lb 7.8 oz)   LMP  (LMP Unknown)   BMI 37.22 kg/m     Physical Exam:  Well developed, in NAD. Conversant, good eye contact. Obese  Heart: regular rate and rhythm. S1 and S2 intensities normal. No  murmurs, rubs or gallops.   Chest: diminished breath sounds at bilateral bases. No wheezes,  rales or rhonchi  Abdomen: soft, nontender, nondistended. Positive bowel sounds. No rebound, no guarding.   Extremities: no peripheral edema  Skin: large hyperpigmented rash in right groin below pannus with few satellite lesions, no purulence, bleeding or discharge  Neuro/MSK: 4-/5 strength in left upper extremity (at baseline), otherwise 5/5 strength in all other extremities    Labs and Imaging:  Lab Results   Component Value Date    WBCCOUNT 9.6 05/23/2018    HGB 12.6 05/23/2018    HCT 36.9 05/23/2018    PLT 229 05/23/2018     Lab Results   Component Value Date    SODIUM 139 05/08/2018    K 4.1 05/08/2018    CL 108 (H) 05/08/2018    CO2 27 05/08/2018    BUN 13 05/08/2018    CREAT 0.9 05/08/2018    GFR >60 05/08/2018    GLU 137 (H) 05/08/2018     Lab Results   Component Value Date    TPROT 7.2 05/08/2018    ALB 4.2 05/08/2018    ALK 63 05/08/2018    ALT 41 05/08/2018    AST 40 (H) 05/08/2018    TBILI 0.6 05/08/2018     Lab Results   Component Value Date    CHOL 105 04/07/2018    TRIG 83 04/07/2018    HDLCH2 37 (L) 04/07/2018    LDLCALC 51 04/07/2018     Lab Results   Component Value Date    A1C 6.4 (H) 07/10/2018      Assessment & Plan:     Regina Ward is a 57 year old female with PMH mental illness (PTSD, schizoaffective - depressive type, anxiety), HTN, COPD, b/l carotid artery stenosis s/p R thromboendarterectomy (12/2016), 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 Could also consider thomboangiitis obliterans 2/2 excessive smoking history (though age is >45 years).  - Trial gabapentin   - Advised patient to bring up right hand pain to vascular surgeons  Orders  - gabapentin (NEURONTIN) 100 MG capsule; Take 1 capsule (100 mg) by mouth At bedtime  as needed (right hand numbness and tingling).  Dispense: 30 capsule; Refill: 1    # Tobacco use disorder  - nicotine (NICOTINE STEP 1) 21 MG/24HR patch; Step 1: apply 1 patch on skin every 24 hours for weeks 1-6 after stop smoking  Dispense: 42 patch; Refill: 0  - nicotine (NICOTINE STEP 2) 14 MG/24HR; Step 2: apply 1 patch on skin every 24 hours for weeks 7-8 after stop smoking  Dispense: 14 patch; Refill: 0  - nicotine (NICOTINE STEP 3) 7 MG/24HR; Step 3: apply 1 patch on skin every 24 hours for weeks 9-10 after stop smoking  Dispense: 14 patch; Refill: 0    # Type 2 diabetes mellitus without complication, without long-term current use of insulin (CMS-HCC)  Last A1c 6.4 (07/10/18). Goal A1c <7. Last LDL 51 (04/07/18)  - Continue metformin 107m BID  - Microvascular screening:    _0  Eye Exam: Ophthalmology referral placed 07/07/18. Given referral information today and advised to schedule    _1  Foot Exam: normal monofilament (07/07/18)    _2  Urine microalbumin/creatinine ratio: 4.5 (04/07/18)    # Essential hypertension  BP 144/65. Patient states was on lisinopril in the past.  - Start lisinopril 5 mg QD  Orders  -  lisinopril (PRINIVIL, ZESTRIL) 5 MG tablet; Take 1 tablet (5 mg) by mouth daily.  Dispense: 90 tablet; Refill: 3    # Chronic constipation  - polyethylene glycol (GLYCOLAX) powder; Take 17 g by mouth daily.  Dispense: 255 g; Refill: 3    # Chronic hepatitis C without hepatic coma (CMS-HCC)  Asymptomatic. Compensated. Longstanding history of untreated HCV infection. Unclear source. Significant work up includes:    - HepC viral load (05/08/18) >2 million     - HepC genotype (05/23/18) 1a    - HepA IgG (05/23/18) immune    - HepB Surface Ab (05/23/18) NR    - AFP (05/23/18) 2.6 normal range    _0  RUQ [place] - ordered 05/22/18, scheduled for 08/29/18  - Per patient, was seen by Dr. Jomarie Longs 08/14/18    # Routine health maintenance  # Need for vaccination  _1  Vaccines: Tdap 04/14/18, HepA immune 05/23/18, influenza 06/23/18,  PPSV23 07/07/18, HepB #1 07/07/18      -- HepB #2 today. HepB #3 due in 02/2019      -- Shingrix rx sent 07/07/18  _2  Colorectal cancer screen: colonoscopy scheduled for 09/06/18  _3  WWE: s/p TAH with BSO, last pap 04/14/18 NILM/HPV neg, last mammo 07/05/18 BI-RADS 2 without evidence of malignancy  _4  Lung Cancer: low dose CT Chest 07/25/18 negative for lung cancer  _5  Eye Exam: Ophthalmology referral placed 07/07/18. Given referral information today and advised to schedule  _6  Dental Visit: 07/2018   Orders  - Hepatitis B (Recombivax, Engerix-B)     Follow Up: Return in about 1 month (around 09/16/2018) for follow up results, medication refill.    Patient discussed with attending, Dr. Rona Ravens

## 2018-08-17 ENCOUNTER — Encounter: Payer: Self-pay | Admitting: Family Medicine

## 2018-08-17 ENCOUNTER — Ambulatory Visit: Payer: No Typology Code available for payment source | Attending: Family Medicine | Admitting: Family Medicine

## 2018-08-17 VITALS — BP 144/65 | HR 63 | Temp 98.5°F | Resp 14 | Wt 203.5 lb

## 2018-08-17 DIAGNOSIS — F17200 Nicotine dependence, unspecified, uncomplicated: Secondary | ICD-10-CM | POA: Insufficient documentation

## 2018-08-17 DIAGNOSIS — K5909 Other constipation: Secondary | ICD-10-CM | POA: Insufficient documentation

## 2018-08-17 DIAGNOSIS — Z23 Encounter for immunization: Secondary | ICD-10-CM | POA: Insufficient documentation

## 2018-08-17 DIAGNOSIS — Z6837 Body mass index (BMI) 37.0-37.9, adult: Secondary | ICD-10-CM | POA: Insufficient documentation

## 2018-08-17 DIAGNOSIS — B182 Chronic viral hepatitis C: Secondary | ICD-10-CM | POA: Insufficient documentation

## 2018-08-17 DIAGNOSIS — M79641 Pain in right hand: Secondary | ICD-10-CM | POA: Insufficient documentation

## 2018-08-17 DIAGNOSIS — Z0000 Encounter for general adult medical examination without abnormal findings: Secondary | ICD-10-CM | POA: Insufficient documentation

## 2018-08-17 DIAGNOSIS — I10 Essential (primary) hypertension: Secondary | ICD-10-CM | POA: Insufficient documentation

## 2018-08-17 DIAGNOSIS — E119 Type 2 diabetes mellitus without complications: Secondary | ICD-10-CM | POA: Insufficient documentation

## 2018-08-17 MED ORDER — NICOTINE 7 MG/24HR TD PT24
MEDICATED_PATCH | TRANSDERMAL | 0 refills | Status: DC
Start: 2018-08-17 — End: 2018-10-24

## 2018-08-17 MED ORDER — GABAPENTIN 100 MG OR CAPS
100.0000 mg | ORAL_CAPSULE | Freq: Every evening | ORAL | 1 refills | Status: DC | PRN
Start: 2018-08-17 — End: 2018-09-18

## 2018-08-17 MED ORDER — POLYETHYLENE GLYCOL 3350 OR POWD
17.0000 g | Freq: Every day | ORAL | 3 refills | Status: DC
Start: 2018-08-17 — End: 2018-09-18

## 2018-08-17 MED ORDER — NICOTINE 14 MG/24HR TD PT24
MEDICATED_PATCH | TRANSDERMAL | 0 refills | Status: DC
Start: 2018-08-17 — End: 2018-10-24

## 2018-08-17 MED ORDER — NICOTINE 21 MG/24HR TD PT24
MEDICATED_PATCH | TRANSDERMAL | 0 refills | Status: DC
Start: 2018-08-17 — End: 2019-03-12

## 2018-08-17 MED ORDER — LISINOPRIL 5 MG OR TABS
5.0000 mg | ORAL_TABLET | Freq: Every day | ORAL | 3 refills | Status: DC
Start: 2018-08-17 — End: 2018-09-18

## 2018-08-17 NOTE — Progress Notes (Signed)
    Attending Attestation: I reviewed the key and critical portions of the history and physical as presented by the resident physician. Agree with the above documentation regarding this patient. Patient was discussed with the resident physician and the assessment and plan was then jointly formulated . My additions or revision are included in the record.       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  As noted per Resident.    Agree with A/P:    # R hand pain, only at night, no pain and normal exam today.  Poss neuropathy vs circulation given her smoking hx. Agree with trial of neurontin 100 mg QHS and f/u with Vascular surgery  # Hep B vaccine # 2  # DM, at goal, cont same regimen  # H/o COPD, with normal PFT recently making it less likely for dx of COPD. Stable, cont same meds, stop smoking        Patient was given strict return precautions should symptoms worsen or fail to improve or any concerns.

## 2018-08-17 NOTE — Patient Instructions (Addendum)
Ophthalmology referral:  Provider/Facility: [facility]  Address: [address]  [phone]   Fax: [phone]     Thank you for letting [facility] take care of you. If you have any questions, don't hesitate to call our clinic at [phone]. If I am not in clinic and you want to see a doctor, please try to get an appointment with either Dr. Venancio PoissonUreno Garcia, Dr. Pricilla LovelessJenny Shiya Fogelman or Dr. Beverly MilchAriel Murtagh.

## 2018-09-18 ENCOUNTER — Encounter: Payer: Self-pay | Admitting: Family Medicine

## 2018-09-18 ENCOUNTER — Ambulatory Visit: Payer: No Typology Code available for payment source | Attending: Family Medicine | Admitting: Family Medicine

## 2018-09-18 VITALS — BP 153/70 | HR 62 | Temp 98.2°F | Resp 14 | Wt 201.7 lb

## 2018-09-18 DIAGNOSIS — R0789 Other chest pain: Secondary | ICD-10-CM | POA: Insufficient documentation

## 2018-09-18 DIAGNOSIS — Z0000 Encounter for general adult medical examination without abnormal findings: Secondary | ICD-10-CM | POA: Insufficient documentation

## 2018-09-18 DIAGNOSIS — G2581 Restless legs syndrome: Secondary | ICD-10-CM | POA: Insufficient documentation

## 2018-09-18 DIAGNOSIS — E1142 Type 2 diabetes mellitus with diabetic polyneuropathy: Secondary | ICD-10-CM | POA: Insufficient documentation

## 2018-09-18 DIAGNOSIS — Z5900 Homelessness unspecified: Secondary | ICD-10-CM

## 2018-09-18 DIAGNOSIS — H918X3 Other specified hearing loss, bilateral: Secondary | ICD-10-CM | POA: Insufficient documentation

## 2018-09-18 DIAGNOSIS — I10 Essential (primary) hypertension: Secondary | ICD-10-CM | POA: Insufficient documentation

## 2018-09-18 DIAGNOSIS — K5901 Slow transit constipation: Secondary | ICD-10-CM | POA: Insufficient documentation

## 2018-09-18 DIAGNOSIS — B182 Chronic viral hepatitis C: Secondary | ICD-10-CM | POA: Insufficient documentation

## 2018-09-18 DIAGNOSIS — I63233 Cerebral infarction due to unspecified occlusion or stenosis of bilateral carotid arteries: Secondary | ICD-10-CM | POA: Insufficient documentation

## 2018-09-18 DIAGNOSIS — I6523 Occlusion and stenosis of bilateral carotid arteries: Secondary | ICD-10-CM | POA: Insufficient documentation

## 2018-09-18 DIAGNOSIS — J449 Chronic obstructive pulmonary disease, unspecified: Secondary | ICD-10-CM | POA: Insufficient documentation

## 2018-09-18 DIAGNOSIS — Z6836 Body mass index (BMI) 36.0-36.9, adult: Secondary | ICD-10-CM | POA: Insufficient documentation

## 2018-09-18 MED ORDER — GABAPENTIN 100 MG OR CAPS
100.0000 mg | ORAL_CAPSULE | Freq: Every evening | ORAL | 3 refills | Status: DC
Start: 2018-09-18 — End: 2019-02-15

## 2018-09-18 MED ORDER — ATORVASTATIN CALCIUM 80 MG OR TABS
80.0000 mg | ORAL_TABLET | Freq: Every evening | ORAL | 3 refills | Status: DC
Start: 2018-09-18 — End: 2019-02-15

## 2018-09-18 MED ORDER — METFORMIN HCL 1000 MG OR TABS
1000.00 mg | ORAL_TABLET | Freq: Two times a day (BID) | ORAL | Status: DC
Start: 2018-09-13 — End: 2018-09-18

## 2018-09-18 MED ORDER — MIRTAZAPINE 15 MG OR TABS
15.00 mg | ORAL_TABLET | Freq: Every evening | ORAL | Status: DC
Start: 2018-09-12 — End: 2018-10-24

## 2018-09-18 MED ORDER — ROPINIROLE HCL 4 MG OR TABS
4.0000 mg | ORAL_TABLET | Freq: Every day | ORAL | 3 refills | Status: DC
Start: 2018-09-18 — End: 2018-10-24

## 2018-09-18 MED ORDER — BISACODYL EC 5 MG OR TBEC
5.0000 mg | DELAYED_RELEASE_TABLET | Freq: Two times a day (BID) | ORAL | 3 refills | Status: DC | PRN
Start: 2018-09-18 — End: 2019-02-15

## 2018-09-18 MED ORDER — ASPIRIN 81 MG OR TBEC
81.0000 mg | DELAYED_RELEASE_TABLET | Freq: Every day | ORAL | 3 refills | Status: DC
Start: 2018-09-18 — End: 2019-02-15

## 2018-09-18 MED ORDER — TIOTROPIUM BROMIDE MONOHYDRATE 18 MCG IN CAPS
18.0000 ug | ORAL_CAPSULE | Freq: Every day | RESPIRATORY_TRACT | 3 refills | Status: DC
Start: 2018-09-18 — End: 2018-10-09

## 2018-09-18 MED ORDER — ALBUTEROL SULFATE 108 (90 BASE) MCG/ACT IN AERS
2.0000 | INHALATION_SPRAY | Freq: Four times a day (QID) | RESPIRATORY_TRACT | 3 refills | Status: DC | PRN
Start: 2018-09-18 — End: 2019-08-22

## 2018-09-18 MED ORDER — NITROGLYCERIN 0.4 MG SL SUBL
0.4000 mg | SUBLINGUAL_TABLET | SUBLINGUAL | 1 refills | Status: DC | PRN
Start: 2018-09-18 — End: 2019-04-11

## 2018-09-18 MED ORDER — LISINOPRIL 10 MG OR TABS
10.0000 mg | ORAL_TABLET | Freq: Every day | ORAL | 3 refills | Status: DC
Start: 2018-09-18 — End: 2018-10-24

## 2018-09-18 NOTE — Progress Notes (Signed)
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 Complains of chronic constipation but otherwise physically feeling well. Denies fevers, chills, headaches, chest pain, shortness of breath, abdominal pain, nausea, vomiting, dysuria.    Emotionally, patient states she continues to feel depressed but denies any thoughts of hurting self or suicidal ideation. She is very distraught because her cousins gave her a 2960 day notice to leave the house as [place], but for some reason the other tenants in the house did not receive an eviction notice. Patient is worried because she has nowhere else to go and does not want to end up in a shelter. She is unemployed due to her complex medical issues and relies on SSI.      [facility] 05/22/2018 05/08/2018 05/08/2018 04/14/2018 02/01/2018 10/25/2017   PHQ2 Total 6 6 6  0 6 0 6   Some recent data might be hidden     Past Medical History:  Patient Active Problem List   Diagnosis   . Insomnia due to other mental disorder   . Bilateral carotid artery stenosis   . Schizophrenia (CMS-HCC)   . Stroke (cerebrum) (CMS-HCC)   . COPD with asthma (CMS-HCC)   . Depression   . Bipolar 1 disorder (CMS-HCC)   . Restless leg syndrome   . Posttraumatic stress disorder   . Type 2 diabetes mellitus without complication, without long-term current use of insulin (CMS-HCC)   . OSA (obstructive sleep apnea)   . History of  uterine cancer   . Tobacco use disorder   . Morbid obesity with BMI of 40.0-44.9, adult (CMS-HCC)   . Chronic hepatitis C without hepatic coma (CMS-HCC)   . Routine health maintenance     Medications:  .  albuterol 108 (90 Base) MCG/ACT inhaler, Inhale 2 puffs by mouth every 6 hours as needed for Wheezing., Disp: 1 Inhaler, Rfl: 3  .  aspirin 81 MG EC tablet, Take 1 tablet (81 mg) by mouth daily., Disp: 60 tablet, Rfl: 3  .  atorvastatin (LIPITOR) 80 MG tablet, Take 1 tablet (80 mg) by mouth nightly., Disp: 60 tablet, Rfl: 3  .  bisacodyl (DULCOLAX) 5 MG Enteric-Coated tablet, Take 1 tablet (5 mg) by mouth daily as needed for Constipation., Disp: 60 tablet, Rfl: 3  .  chlorhexidine (PERIDEX) 0.12 % solution, Rinse with 1/2 ounce by mouth for 30 seconds then spit out. use twice a day for 7 days, Disp: , Rfl: 0  .  clopidogrel (PLAVIX) 75 MG tablet, Take 1 tablet (75 mg) by mouth daily., Disp: 60 tablet, Rfl: 3  .  diclofenac (VOLTAREN) 1 % gel, Apply 1 g topically 4 times daily., Disp: 1 Tube, Rfl: 1  .  gabapentin (NEURONTIN) 100 MG capsule, Take 1 capsule (100 mg) by mouth At bedtime as needed (right hand numbness and tingling)., Disp: 30 capsule, Rfl: 1  .  lisinopril (PRINIVIL, ZESTRIL) 5 MG tablet, Take 1 tablet (5 mg) by mouth daily., Disp: 90 tablet, Rfl: 3  .  metFORMIN (GLUCOPHAGE) 1000 MG tablet, Take 1 tablet (1,000 mg) by mouth 2 times daily (with meals)., Disp: 120 tablet, Rfl: 3  .  mirtazapine (REMERON) 15 MG tablet, Take 15 mg by mouth nightly., Disp: , Rfl:   .  nicotine (NICOTINE STEP 1) 21 MG/24HR patch, Step 1: apply 1 patch on skin every 24 hours for weeks 1-6 after stop smoking, Disp: 42 patch, Rfl: 0  .  nicotine (NICOTINE STEP 2) 14 MG/24HR, Step 2: apply 1 patch on skin every 24 hours for weeks 7-8 after stop smoking, Disp: 14 patch, Rfl: 0  .  nicotine (NICOTINE STEP 3) 7 MG/24HR, Step 3: apply 1 patch on skin every 24 hours for weeks 9-10 after stop smoking, Disp: 14 patch, Rfl: 0  .   nitroGLYcerin (NITROSTAT) 0.4 MG SL tablet, 1 tablet (0.4 mg) by Sublingual route every 5 minutes as needed for Chest Pain. up to 3 tabs per episode., Disp: 1 bottle, Rfl: 1  .  potassium chloride (K-DUR) 10 MEQ Sustained-Release tablet, Take 1 tablet (10 mEq) by mouth daily., Disp: 60 tablet, Rfl: 3  .  QUEtiapine (SEROQUEL) 100 MG tablet, Take 100 mg by mouth every morning., Disp: , Rfl: 0  .  QUEtiapine (SEROQUEL) 300 MG tablet, Take 300 mg by mouth at bedtime., Disp: , Rfl: 0  .  ropinirole (REQUIP) 4 MG tablet, Take 1 tablet (4 mg) by mouth daily., Disp: 60 tablet, Rfl: 3  .  spacer (AEROCHAMBER) MISC, Use as directed, Disp: 1 each, Rfl: 3  .  Spacer/Aero-Holding Chambers (AEROCHAMBER PLUS FLO-VU) MISC, As Directed., Disp: , Rfl: 0  .  terbinafine (LAMISIL) 1 % cream, Apply 1 Application topically 2 times daily. Use a small amount as directed until resolution, Disp: 42 g, Rfl: 3  .  tiotropium (SPIRIVA HANDIHALER) 18 MCG inhalation capsule, Inhale 1 capsule (18 mcg) by mouth daily., Disp: 30 capsule, Rfl: 3    Allergies:  No Known Allergies    SOCIAL HISTORY:      reports that she has been smoking cigarettes. She has a 61.50 pack-year smoking history. She has never used smokeless tobacco. She reports that she does not drink alcohol or use drugs.    Review of Systems:  A 10 system ROS was performed and is negative except as above in HPI    Objective:     BP 153/70 (BP Location: Right arm, BP Patient Position: Sitting, BP cuff size: Large)   Pulse 62   Temp 98.2 F (36.8 C) (Oral)   Resp 14   Wt 91.5 kg (201 lb 11.5 oz)   LMP  (LMP Unknown)   BMI 36.90 kg/m     Physical Exam:  Well developed, in NAD. Conversant, good eye contact. Obese  Heart: regular rate and rhythm. S1 and S2 intensities normal. No murmurs, rubs or gallops.   Chest: clear to auscultation bilaterally. No wheezes, rales or rhonchi  Abdomen: soft, nontender, nondistended. Positive bowel sounds. No rebound, no guarding.   Extremities: no  peripheral edema  Skin: rash in groin area beneath pannus now resolved  Neuro/MSK: 4-/5 strength in left upper extremity (at baseline), otherwise 5/5 strength in all other extremities    Labs and Imaging:  Colonoscopy 09/06/18  Findings:  - Excavated lesions: several non-bleeding diverticula with medium openings were seen in the whole colon. Diverticulosis appeared be of  moderate severity.  - Protruding lesions: two sessile 0.3 cm non-bleeding polyps of benign appearance were found. Multiple cold forceps biopsies were performed for histology.  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 Not at goal  - Does not check blood pressures at home. Patient has difficulty using blood pressure cuff due to left arm weakness s/p stroke  - Increase lisinopril to 10mg  QD  - Low salt diet  - Return in 1 month for follow up  Orders  - lisinopril (PRINIVIL, ZESTRIL) 10 MG tablet; Take 1 tablet (10 mg) by mouth daily.  Dispense: 90 tablet; Refill: 3    # Lack of housing  # Suicide Risk: Intervention C  Cousins leaving to [facility]. Patient needs to leave house by end of this month. Refer to Child psychotherapist for assistance. Patient relies on SSI given her complex medical problems. These events have caused her to become more depressed but she is currently low risk in terms of being a risk for herself or others. Denies thoughts of hurting self or suicidal thoughts.  Orders  - Consult[facility]referral to [facility]    # Type 2 diabetes mellitus with diabetic  polyneuropathy, without long-term current use of insulin (CMS-HCC)  Last A1c 6.4 (07/10/18). Goal A1c <7. Last LDL 51 (04/07/18)  - Continue metformin 1000mg  BID  - Microvascular screening:    [ ]  Eye Exam: ophthalmology referral placed 07/07/18. Advised to schedule 08/17/18. Not completed - reminded.    [x]  Foot Exam: (07/07/18) normal monofilament    [x]  Urine microalbumin/creatinine ratio: 4.5 (04/07/18)  - Refill gabapentin for peripheral neuropathy  Orders  - gabapentin (NEURONTIN) 100 MG capsule; Take 1 capsule (100 mg) by mouth at bedtime.  Dispense: 90 capsule; Refill: 3    # Other chest pain  Suspect cardiac-related though occurs infrequently, 1-2 times per month. Suspect some component of CAD but has never had Lexiscan/ECHO  - Refill nitroglycerin. Patient states uses 1-2 times per month  - Referral to cardiology to establish care given complex vascular history  Orders  - nitroGLYcerin (NITROSTAT) 0.4 MG SL tablet; 1 tablet (0.4 mg) by Sublingual route every 5 minutes as needed for Chest Pain. up to 3 tabs per episode.  Dispense: 1 bottle; Refill: 1  - Consult/Referral to Cardiology Clinic    # Cerebrovascular accident (CVA) due to bilateral stenosis of carotid arteries (CMS-HCC)  - aspirin 81 MG EC tablet; Take 1 tablet (81 mg) by mouth daily.  Dispense: 90 tablet; Refill: 3  - atorvastatin (LIPITOR) 80 MG tablet; Take 1 tablet (80 mg) by mouth nightly.  Dispense: 90 tablet; Refill: 3    # COPD with asthma (CMS-HCC)  PFT 08/04/18 normal. However, patient has extensive history of smoking and shortness of breath.  - albuterol 108 (90 Base) MCG/ACT inhaler; Inhale 2 puffs by mouth every 6 hours as needed for Wheezing.  Dispense: 1 Inhaler; Refill: 3  - tiotropium (SPIRIVA HANDIHALER) 18 MCG inhalation capsule; Inhale 1 capsule (18 mcg) by mouth daily.  Dispense: 30 capsule; Refill: 3    # Restless leg syndrome  - ropinirole (REQUIP) 4 MG tablet; Take 1 tablet (4 mg) by mouth daily.  Dispense: 90 tablet; Refill:  3    # Slow transit constipation  - bisacodyl (DULCOLAX) 5 MG Enteric-Coated tablet; Take 1 tablet (5 mg) by mouth 2 times daily as needed for Constipation.  Dispense: 60 tablet; Refill: 3    # Other specified hearing loss of both ears  - Consult/Referral to [facility] for hearing aids. Patient previously had them, but were thrown out on her move to [place] from [place]    Follow Up: Return in about 1 month (around 10/19/2018) for follow up HTN.      Patient seen and discussed with attending, Dr. Vear Clock

## 2018-09-18 NOTE — Progress Notes (Signed)
ATTENDING ATTESTATION NOTE  I discussed and saw this 57 year old female patient with resident physician Dr. Danella Pentonruong. The assessment and plan was then jointly formulated with the resident physician.   Regina Ward has a past hx of: dep, anxiety, PTSD, ? Developmental cognitive disability, followed by county MH services and psychiatry, carotid stenosis, s/p EA, prev stroke, DM, Htn, HCV, RLS, chronic constipation, f/p recent colonoscopy, ? CAD, on prn NTG that she very rarely uses and she presents today with a complaint of f/u. Further hx includes started on lisinopril last visit, no home BP checks. The rest of hx and ROS per resident note. Prior family related trauma, current impending homelessness  Exam: BP 153/70 (BP Location: Right arm, BP Patient Position: Sitting, BP cuff size: Large)   Pulse 62   Temp 98.2 F (36.8 C) (Oral)   Resp 14   Wt 91.5 kg (201 lb 11.5 oz)   LMP  (LMP Unknown)   BMI 36.90 kg/m  physical exam per the resident, confirmed by me, shows she is  alert, conversant, NAD, baseline weakness LUE, Cor:RR, Lungs: clear, Ext w/o edema, rest of exam unremarkable  .  Relevant lab and imaging results: Aug labs nl K and renal fx.   I agree with assessment of multiple medical and behavioral health chronic probs as above and per resident note, agree Htn non-ideal control and plan of incr lisinopril 5 to 10mg , dc K+ supplement, keep f/u with county MH, refer to SW for homeless resources, see resident note for rest of issues and plan, con't ASA, plavix, high dose statin.     Desmond Lopeavid B Yancy Hascall, MD

## 2018-09-18 NOTE — Patient Instructions (Addendum)
Provider/Facility: [facility] (eye doctor)  Address: [phone]  [phone]   Fax: [phone]     - You have been referred to ENT (for hearing aids) and cardiology (for your heart)  - Pick up your medications  - You have also been referred to our Child psychotherapistsocial worker. Please call our clinic if you do not get a call within 1-2 weeks    Thank you for letting [facility] take care of you. If you have any questions, don't hesitate to call our clinic at [phone]. If I am not in clinic and you want to see a doctor, please try to get an appointment with either Dr. Venancio PoissonUreno Garcia, Dr. Pricilla LovelessJenny Keisi Eckford or Dr. Beverly MilchAriel Murtagh.    Gracias por permitendo a Family Medicine, EQUIPO ROJO, a cuidando a usted hoy. Si Usted tiene alguna pregunta o inquietud despus de su cita hoy, llame al [phone].  Si no puede tener una cita [place], puede [place] una con: Dr. Venancio PoissonUreno Garcia, Dr. Pricilla LovelessJenny Sulo Janczak o Dr. Beverly MilchAriel Murtagh.

## 2018-09-19 ENCOUNTER — Ambulatory Visit: Payer: No Typology Code available for payment source

## 2018-09-19 ENCOUNTER — Ambulatory Visit: Payer: No Typology Code available for payment source | Attending: Family Medicine

## 2018-09-19 DIAGNOSIS — F319 Bipolar disorder, unspecified: Principal | ICD-10-CM | POA: Insufficient documentation

## 2018-09-19 DIAGNOSIS — Z599 Problem related to housing and economic circumstances, unspecified: Secondary | ICD-10-CM | POA: Insufficient documentation

## 2018-09-19 NOTE — Interdisciplinary (Signed)
Identifying and presenting information: Patient is a 57 year old single [place] American female referred for housing help. First session with CSW.  Patient was alternately tearful and irritable throughout session.    Clinical social work interventions: CSW gathered information about circumstances of patient's eviction notice (60 day notice received from her cousins, requiring move by 12/1) and attempts to problem solve.  CSW clarified that she 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  CSW asked about her children which triggered tears for patient, as all of her children were placed for adoption and she sees only one of them.  Patient was unwilling to consider asking this daughter if she could live with her temporarily.  CSW explained [facility] program, assisted patient to make referral call, referral was accepted by officer of the day Dan.  CSW explained to patient importance of follow up calls to ensure intake before the holidays, and explained not to terminate services with current county providers unless she is accepted by [facility]. CSW also provided contact information for [employer] transportation to medical and therapy appointments.    Psychosocial care plan and follow up: CSW provided contact information in case of questions/concerns.

## 2018-10-05 ENCOUNTER — Telehealth: Payer: Self-pay

## 2018-10-05 DIAGNOSIS — J449 Chronic obstructive pulmonary disease, unspecified: Secondary | ICD-10-CM

## 2018-10-05 NOTE — Telephone Encounter (Signed)
Byrd Hesselbach from [employer] is calling to advise Spiriva Handihaler is not covered by insurance. Please follow up with pharmacy if Dr. Danella Penton would like to do a Prior Authorization or alternative medication, thank you

## 2018-10-06 NOTE — Telephone Encounter (Signed)
Message forward to Dr. Truong J

## 2018-10-09 MED ORDER — TIOTROPIUM BROMIDE MONOHYDRATE 1.25 MCG/ACT IN AERS
2.0000 | INHALATION_SPRAY | Freq: Every day | RESPIRATORY_TRACT | 3 refills | Status: DC
Start: 2018-10-09 — End: 2019-08-22

## 2018-10-09 NOTE — Telephone Encounter (Signed)
Will send prescription for alternative medication. I am unavailable to go to clinic to fill out prior auth. I am on inpatient wards.    Pricilla Loveless, MD   [facility] Family Medicine

## 2018-10-11 ENCOUNTER — Ambulatory Visit: Payer: No Typology Code available for payment source

## 2018-10-12 ENCOUNTER — Telehealth: Payer: Self-pay

## 2018-10-12 NOTE — Telephone Encounter (Signed)
Reviewed prior auth and filled out on behalf of PCP.    JFAC  Juanetta Beets, MD PGY3  [facility] Family Medicine  Pager: [phone]

## 2018-10-12 NOTE — Telephone Encounter (Signed)
Please see phone note from 01/02. Maria calling in from [facility] to check status on Spiriva. Called clinic and transferred to [facility]. Thank you

## 2018-10-12 NOTE — Telephone Encounter (Signed)
tiotropium (SPIRIVA RESPIMAT) 1.25 MCG/ACT AERS is not cover also PA initiated and placed in [facility] desk

## 2018-10-13 NOTE — Telephone Encounter (Signed)
PA faxed to [employer] today  Fax confirmation received

## 2018-10-17 ENCOUNTER — Telehealth: Payer: Self-pay | Admitting: Family Medicine

## 2018-10-17 NOTE — Telephone Encounter (Signed)
Jen from Dr. Daivd Council office Laurette Schimke Dr. Is requesting lab results, stated that she called medical records and was forward to [place] for the request. Please further assist. Fax [phone] attention Candise Bowens

## 2018-10-17 NOTE — Telephone Encounter (Signed)
Spoke to [place] at Dr. Benedict Needy office, advised that unfortunately she would have to call back Medical Records. She agreed

## 2018-10-24 ENCOUNTER — Ambulatory Visit: Payer: No Typology Code available for payment source | Attending: Family Medicine | Admitting: Family Medicine

## 2018-10-24 ENCOUNTER — Encounter: Payer: Self-pay | Admitting: Family Medicine

## 2018-10-24 VITALS — BP 142/67 | HR 53 | Temp 98.3°F | Resp 17 | Ht 62.0 in | Wt 200.2 lb

## 2018-10-24 DIAGNOSIS — F251 Schizoaffective disorder, depressive type: Secondary | ICD-10-CM | POA: Insufficient documentation

## 2018-10-24 DIAGNOSIS — M79641 Pain in right hand: Secondary | ICD-10-CM | POA: Insufficient documentation

## 2018-10-24 DIAGNOSIS — B182 Chronic viral hepatitis C: Secondary | ICD-10-CM | POA: Insufficient documentation

## 2018-10-24 DIAGNOSIS — Z6836 Body mass index (BMI) 36.0-36.9, adult: Secondary | ICD-10-CM | POA: Insufficient documentation

## 2018-10-24 DIAGNOSIS — I6523 Occlusion and stenosis of bilateral carotid arteries: Secondary | ICD-10-CM | POA: Insufficient documentation

## 2018-10-24 DIAGNOSIS — E1142 Type 2 diabetes mellitus with diabetic polyneuropathy: Secondary | ICD-10-CM | POA: Insufficient documentation

## 2018-10-24 DIAGNOSIS — Z7182 Exercise counseling: Secondary | ICD-10-CM | POA: Insufficient documentation

## 2018-10-24 DIAGNOSIS — I10 Essential (primary) hypertension: Secondary | ICD-10-CM | POA: Insufficient documentation

## 2018-10-24 DIAGNOSIS — G2581 Restless legs syndrome: Secondary | ICD-10-CM | POA: Insufficient documentation

## 2018-10-24 DIAGNOSIS — Z713 Dietary counseling and surveillance: Secondary | ICD-10-CM | POA: Insufficient documentation

## 2018-10-24 DIAGNOSIS — F17200 Nicotine dependence, unspecified, uncomplicated: Secondary | ICD-10-CM | POA: Insufficient documentation

## 2018-10-24 DIAGNOSIS — Z0000 Encounter for general adult medical examination without abnormal findings: Secondary | ICD-10-CM | POA: Insufficient documentation

## 2018-10-24 MED ORDER — NICOTINE 7 MG/24HR TD PT24
MEDICATED_PATCH | TRANSDERMAL | 0 refills | Status: DC
Start: 2018-10-24 — End: 2019-03-12

## 2018-10-24 MED ORDER — QUETIAPINE FUMARATE 100 MG OR TABS
100.0000 mg | ORAL_TABLET | Freq: Every morning | ORAL | 0 refills | Status: DC
Start: 2018-10-24 — End: 2018-11-21

## 2018-10-24 MED ORDER — ROPINIROLE HCL 4 MG OR TABS
4.0000 mg | ORAL_TABLET | Freq: Every day | ORAL | 3 refills | Status: DC
Start: 2018-10-24 — End: 2019-02-15

## 2018-10-24 MED ORDER — LISINOPRIL 20 MG OR TABS
20.0000 mg | ORAL_TABLET | Freq: Every day | ORAL | 3 refills | Status: DC
Start: 2018-10-24 — End: 2019-02-15

## 2018-10-24 MED ORDER — NICOTINE 14 MG/24HR TD PT24
MEDICATED_PATCH | TRANSDERMAL | 0 refills | Status: DC
Start: 2018-10-24 — End: 2019-03-12

## 2018-10-24 MED ORDER — CLOPIDOGREL BISULFATE 75 MG OR TABS
75.0000 mg | ORAL_TABLET | Freq: Every day | ORAL | 3 refills | Status: DC
Start: 2018-10-24 — End: 2018-12-22

## 2018-10-24 MED ORDER — QUETIAPINE FUMARATE 200 MG OR TABS
300.0000 mg | ORAL_TABLET | Freq: Every evening | ORAL | 0 refills | Status: DC
Start: 2018-10-24 — End: 2018-11-21

## 2018-10-24 NOTE — Progress Notes (Signed)
Family Medicine Continuity Clinic    Pricilla LovelessJenny Martinez Boxx, MD  [facility] Department of Family Medicine Resident   [facility]    Patient: Regina Ward  DOB: 05-15-1961  Primary Care Provider: Radonna RickerHan, Jaesu  Date of Service:  10/24/2018    Subjective:      Regina Ward is a 58 year old Non-Hispanic female with PMH mental illness (PTSD, schizoaffective - depressive type, anxiety), COPD, b/l carotid artery stenosis s/p R thromboendarterectomy (12/2016), CVA (2018) c/b left sided residual weakness, insomnia, T2DM, chronic HepC who presents today for Hypertension    # HTN  - Unable to check blood pressures at home secondary to left arm weakness s/p stroke  - Current medications: lisinopril 10mg  QD  - Denies headache, chest pain, shortness of breath, palpitations, lightheadedness, dizziness, leg swelling, or diaphoresis      # Right hand pain  - Patient states has had arthritic pain x3 - 4 months  - Worse at night, better in the morning, but pain persists throughout the day  - Has tried Tylenol OTC, 2 tablets per day with no alleviation  - Has tried ibuprofen as well without alleviation  - Tried ACE wrap bandages as well as salonpas patches without improvement  - Tried aspercreme as well without relief  - Patient has never had CSI or acupuncture and is not interested    # Living Situation/Mental Health  - [facility]. Per patient, this program will help with mental health issues as well as patient's living situation. Patient will follow up 10/31/18 for further evaluation  - Patient is still currently at the same residence  - Patient states she still feels depressed but not worse from last visit. No thoughts of self harm or suicidal ideation    # Eye Exam  - Has an appointment 10/30/18    # Hearing Aids  - Has an appointment 10/25/18    # Cardiology  - Has an appointment 10/27/18    [facility] 09/18/2018 05/22/2018 05/08/2018 05/08/2018 04/14/2018 02/01/2018 10/25/2017   PHQ2 Total 6 6 6  0 6 0 6   Some  recent data might be hidden     Past Medical History:  Patient Active Problem List   Diagnosis   . Insomnia due to other mental disorder   . Bilateral carotid artery stenosis   . Schizophrenia (CMS-HCC)   . Stroke (cerebrum) (CMS-HCC)   . COPD with asthma (CMS-HCC)   . Schizoaffective disorder, depressive type (CMS-HCC)   . Bipolar 1 disorder (CMS-HCC)   . Restless leg syndrome   . Posttraumatic stress disorder   . Type 2 diabetes mellitus with diabetic polyneuropathy, without long-term current use of insulin (CMS-HCC)   . OSA (obstructive sleep apnea)   . History of uterine cancer   . Tobacco use disorder   . Morbid obesity with BMI of 40.0-44.9, adult (CMS-HCC)   . Chronic hepatitis C without hepatic coma (CMS-HCC)   . Routine health maintenance   . Essential hypertension     Medications:  .  albuterol 108 (90 Base) MCG/ACT inhaler, Inhale 2 puffs by mouth every 6 hours as needed for Wheezing., Disp: 1 Inhaler, Rfl: 3  .  aspirin 81 MG EC tablet, Take 1 tablet (81 mg) by mouth daily., Disp: 90 tablet, Rfl: 3  .  atorvastatin (LIPITOR) 80 MG tablet, Take 1 tablet (80 mg) by mouth nightly., Disp: 90 tablet, Rfl: 3  .  bisacodyl (DULCOLAX) 5  MG Enteric-Coated tablet, Take 1 tablet (5 mg) by mouth 2 times daily as needed for Constipation., Disp: 60 tablet, Rfl: 3  .  chlorhexidine (PERIDEX) 0.12 % solution, Rinse with 1/2 ounce by mouth for 30 seconds then spit out. use twice a day for 7 days, Disp: , Rfl: 0  .  clopidogrel (PLAVIX) 75 MG tablet, Take 1 tablet (75 mg) by mouth daily., Disp: 60 tablet, Rfl: 3  .  diclofenac (VOLTAREN) 1 % gel, Apply 1 g topically 4 times daily., Disp: 1 Tube, Rfl: 1  .  gabapentin (NEURONTIN) 100 MG capsule, Take 1 capsule (100 mg) by mouth at bedtime., Disp: 90 capsule, Rfl: 3  .  lisinopril (PRINIVIL, ZESTRIL) 10 MG tablet, Take 1 tablet (10 mg) by mouth daily., Disp: 90 tablet, Rfl: 3  .  metFORMIN (GLUCOPHAGE) 1000 MG tablet, Take 1 tablet (1,000 mg) by mouth 2 times daily (with  meals)., Disp: 120 tablet, Rfl: 3  .  mirtazapine (REMERON) 15 MG tablet, Take 15 mg by mouth nightly., Disp: , Rfl:   .  nicotine (NICOTINE STEP 1) 21 MG/24HR patch, Step 1: apply 1 patch on skin every 24 hours for weeks 1-6 after stop smoking, Disp: 42 patch, Rfl: 0  .  nicotine (NICOTINE STEP 2) 14 MG/24HR, Step 2: apply 1 patch on skin every 24 hours for weeks 7-8 after stop smoking, Disp: 14 patch, Rfl: 0  .  nicotine (NICOTINE STEP 3) 7 MG/24HR, Step 3: apply 1 patch on skin every 24 hours for weeks 9-10 after stop smoking, Disp: 14 patch, Rfl: 0  .  nitroGLYcerin (NITROSTAT) 0.4 MG SL tablet, 1 tablet (0.4 mg) by Sublingual route every 5 minutes as needed for Chest Pain. up to 3 tabs per episode., Disp: 1 bottle, Rfl: 1  .  QUEtiapine (SEROQUEL) 100 MG tablet, Take 100 mg by mouth every morning., Disp: , Rfl: 0  .  QUEtiapine (SEROQUEL) 300 MG tablet, Take 300 mg by mouth at bedtime., Disp: , Rfl: 0  .  ropinirole (REQUIP) 4 MG tablet, Take 1 tablet (4 mg) by mouth daily., Disp: 90 tablet, Rfl: 3  .  spacer (AEROCHAMBER) MISC, Use as directed, Disp: 1 each, Rfl: 3  .  Spacer/Aero-Holding Chambers (AEROCHAMBER PLUS FLO-VU) MISC, As Directed., Disp: , Rfl: 0  .  terbinafine (LAMISIL) 1 % cream, Apply 1 Application topically 2 times daily. Use a small amount as directed until resolution, Disp: 42 g, Rfl: 3  .  tiotropium (SPIRIVA RESPIMAT) 1.25 MCG/ACT AERS, Inhale 2 puffs by mouth daily., Disp: 4 g, Rfl: 3    Allergies:  No Known Allergies    SOCIAL HISTORY:      reports that she has been smoking cigarettes. She has a 61.50 pack-year smoking history. She has never used smokeless tobacco. She reports that she does not drink alcohol or use drugs.    Review of Systems:  A 10 system ROS was performed and is negative except as above in HPI    Objective:     BP 142/67 (BP Location: Right arm, BP Patient Position: Sitting, BP cuff size: Regular)   Pulse 53   Temp 98.3 F (36.8 C) (Oral)   Resp 17   Ht 5\' 2"   (1.575 m)   Wt 90.8 kg (200 lb 2.8 oz)   LMP  (LMP Unknown)   SpO2 100%   BMI 36.61 kg/m     Physical Exam:  Well developed, in NAD. Conversant, good eye contact.  Heart: regular rate and rhythm. S1 and S2 intensities normal. No murmurs, rubs or gallops.   Chest: clear to auscultation bilaterally. No wheezes, rales or rhonchi  Abdomen: soft, nontender, nondistended. Positive bowel sounds. No rebound, no guarding.   Extremities: no peripheral edema  Neuro/MSK: +Tinel's sign on right hand, no atrophy of bilateral thenar eminence, no joint deformities, no TTP in any joints of bilateral hands, reduced strength 4-/5 in left upper extremity (at baseline), otherwise 5/5 strength in all other extremities    Labs and Imaging:  None new    Assessment & Plan:     Regina Ward is a 58 year old female with PMH mental illness (PTSD, schizoaffective - depressive type, anxiety), COPD, b/l carotid artery stenosis s/p R thromboendarterectomy (12/2016), CVA (2018) c/b left sided residual weakness, insomnia, T2DM, chronic HepC who presents today for HTN follow up    # Essential hypertension  Goal blood pressure <140/90.  - Patient unable to check BP at home on her own as she is unable to coordinate the BP cuff with her left arm weakness s/p stroke  - Blood pressure today 142/67, not at goal  - Increase lisinopril to 20mg  daily  - Low salt diet  Orders   - lisinopril (PRINIVIL, ZESTRIL) 20 MG tablet; Take 1 tablet (20 mg) by mouth daily.  Dispense: 60 tablet; Refill: 3    # Right hand pain  # Diabetic polyneuropathy associated with type 2 diabetes mellitus (CMS-HCC)  Duration 3-4 months. Patient attributes to arthritis. As this is entirely possible, it is likely multifactorial. Other etiology includes diabetic neuropathy as she also describes numbness and tingling in her hand and fingers. She also has +Tinel sign with some history consistent with carpal tunnel syndrome.  - Will provide wrist brace  - Referral for nerve  conduction study/EMG for further evaluation  Orders  - Wrist Brace DME  - EMG - 2 Limbs; Future    # Restless leg syndrome  - Advised patient not to take more than prescribed. She has been taking 8mg  nightly. Continue at 4mg  QHS  - Discussed that gabapentin will likely help with both peripheral neuropathy and RLS, but she declines to uptitrate 100mg  daily dose and prefers to stop it all together because she states it does not work  Orders  - ropinirole (REQUIP) 4 MG tablet; Take 1 tablet (4 mg) by mouth daily.  Dispense: 90 tablet; Refill: 3    # Schizoaffective disorder, depressive type (CMS-HCC)  Pending establishment of care with [employer]. Will provide refill of Seroquel until patient has new psychiatrist. Per patient, was dismissed from [place] as she will be establishing care with new Psychiatrist via [employer]  Orders  - QUEtiapine (SEROQUEL) 100 MG tablet; Take 1 tablet (100 mg) by mouth every morning.  Dispense: 60 tablet; Refill: 0  - QUEtiapine (SEROQUEL) 200 MG tablet; Take 1.5 tablets (300 mg) by mouth at bedtime.  Dispense: 90 tablet; Refill: 0    # Tobacco use disorder  Currently smoking 3-4 cigarettes per day. Advised to completely stop altogether  Orders  - nicotine (NICOTINE STEP 2) 14 MG/24HR; Step 2: apply 1 patch on skin every 24 hours for weeks 7-8 after stop smoking  Dispense: 14 patch; Refill: 0  - nicotine (NICOTINE STEP 3) 7 MG/24HR; Step 3: apply 1 patch on skin every 24 hours for weeks 9-10 after stop smoking  Dispense: 14 patch; Refill: 0    # Bilateral carotid artery stenosis  History  of bilateral carotid artery stenosis 12/2017 leading to stroke, s/p R thromboendarterectomy. Saw vascular surgery 06/2018. Has follow up some time this year. Per patient, no need for further intervention at this time upon reviewing ultrasound results.  - Refill plavix  Orders  - clopidogrel (PLAVIX) 75 MG tablet; Take 1 tablet (75 mg) by mouth daily.  Dispense: 90 tablet; Refill: 3     #  Class 2 severe obesity due to excess calories with serious comorbidity and BMI of 36.6  # Exercise and nutritional counseling    Follow Up: Return in about 1 month (around 11/24/2018) for follow up EMG results & HTN.      Patient discussed with attending, Dr. Tawni Millers      ----------------------------------------------------------------------------------------------------    Attending Note:     Agree with the above documentation regarding this patient. Patient was discussed with the resident physician. The assessment and plan was then jointly formulated with the resident physician.     Subjective: Please see resident note for further details, but briefly, Regina Ward is a 58 year old female  here for:    Here for follow up on HTN.  Does not check at home.  Prior CVA with left sided weakness.  Lisinopril 10 mg daily.    Right hand pain due to arthritis (?)  and worse past few months.  Patient calls it arthritis ,but not clear if due to arthritis.  Took tylenol, ibuprofen and hot and cold treatments.  Tried all kinds of treatments.  Occasional numbness and tingling.  Says it is her whole hand and cannot clearly localize to a particular area or joint.    Hx schizophrenia/depression.  Last month was being evicted from her home and needs to find a new place to live.  People she was living with are moving so needs to find a new place to live. Working with the [employer] to help with psych problems.  No HI/SI.  Referral for eye exam and hearing test.    Atypical chest pain and going to see cardiology.      Objective: BP 142/67 (BP Location: Right arm, BP Patient Position: Sitting, BP cuff size: Regular)   Pulse 53   Temp 98.3 F (36.8 C) (Oral)   Resp 17   Ht 5\' 2"  (1.575 m)   Wt 90.8 kg (200 lb 2.8 oz)   LMP  (LMP Unknown)   SpO2 100%   BMI 36.61 kg/m  as per resident physician.   Relevant details below:     General exam unremarkable.        LABS:   Lab Results   Component Value Date    A1C 6.4 (H)  07/10/2018        Assessment/Plan: I agree with resident's assessment and plan of:     1. Essential hypertension  Increase the lisinopril to 20 mg daily.  She is not able to comply with home BP monitoring.    2. Right hand pain  I would suggest an EMG/NCS to see if it is a neuropathy as a cause of the pain first and establish diagnosis.  She just says she has hand pain but cannot localize it to a particular joint or area so not clear what the cause of the pain/numbness is.  - Wrist Brace DME      3. Tobacco use disorder  Cut back significantly but needs to stop.  - nicotine (NICOTINE STEP 2) 14 MG/24HR; Step 2: apply 1 patch on skin every  24 hours for weeks 7-8 after stop smoking  Dispense: 14 patch; Refill: 0  - nicotine (NICOTINE STEP 3) 7 MG/24HR; Step 3: apply 1 patch on skin every 24 hours for weeks 9-10 after stop smoking  Dispense: 14 patch; Refill: 0    4. Schizoaffective disorder, depressive type (CMS-HCC)  Needs to establish with new psych so refill meds in the meantime.  Working with the [employer] [facility] to help with psych and housing problems.  - QUEtiapine (SEROQUEL) 100 MG tablet; Take 1 tablet (100 mg) by mouth every morning.  Dispense: 60 tablet; Refill: 0  - QUEtiapine (SEROQUEL) 200 MG tablet; Take 1.5 tablets (300 mg) by mouth at bedtime.  Dispense: 90 tablet; Refill: 0    5. Bilateral carotid artery stenosis  Hx CVA and had stent placed in the past.  - clopidogrel (PLAVIX) 75 MG tablet; Take 1 tablet (75 mg) by mouth daily.  Dispense: 90 tablet; Refill: 3    6. Restless leg syndrome    - ropinirole (REQUIP) 4 MG tablet; Take 1 tablet (4 mg) by mouth daily.  Dispense: 90 tablet; Refill: 3      Gladis Riffleavid Morohashi, M.D.,   Clinical Professor   [facility] Department of [facility]

## 2018-10-24 NOTE — Patient Instructions (Signed)
Thank you for letting [facility] take care of you. If you have any questions, don't hesitate to call our clinic at [phone]. If I am not in clinic and you want to see a doctor, please try to get an appointment with either Dr. Ureno Garcia, Dr. Khaliyah Northrop or Dr. Ariel Murtagh.    Gracias por permitendo a Family Medicine, EQUIPO ROJO, a cuidando a usted hoy. Si Usted tiene alguna pregunta o inquietud despus de su cita hoy, llame al [phone].  Si no puede tener una cita conmigo, puede tener una con: Dr. Ureno Garcia, Dr. Odyssey Vasbinder o Dr. Ariel Murtagh.

## 2018-10-25 NOTE — Progress Notes (Signed)
Attending Note:    Subjective:  I reviewed the history.  Patient interviewed and examined.  History of present illness (HPI):  Hypertension, multiple medical issues.     Review of Systems (ROS): As per the resident's note.  Past Medical, Family, Social History:  As per the resident's  note.    Objective:   I have examined the patient and I concur with the resident's exam.    Assessment and plan reviewed with the resident physician.  I agree with the resident's plan as documented.    See the resident's note for further details.

## 2018-11-14 ENCOUNTER — Other Ambulatory Visit: Payer: Self-pay | Admitting: Family Medicine

## 2018-11-14 DIAGNOSIS — F251 Schizoaffective disorder, depressive type: Secondary | ICD-10-CM

## 2018-11-14 NOTE — Telephone Encounter (Signed)
Patient was already prescribed 2 month supply of seroquel 300mg  (1.5 tablets) QHS on 10/24/18. Unclear why patient needs another prescription.

## 2018-11-14 NOTE — Telephone Encounter (Signed)
Pharmacy calling for medication refill for serquel 200 mg they state pt lost her medication so they are requesting please call pharmacy.

## 2018-11-15 NOTE — Telephone Encounter (Signed)
2nd call please see previous message from 02/11. Byrd Hesselbach calling in from [facility] because they need to get a ok for pt to get a refill of Seroquel because she lost. iphone utilized. Thank you

## 2018-11-15 NOTE — Telephone Encounter (Signed)
3rd call Regina Ward from [facility] called to follow up on the below messages regarding a request for another prescription for Seroquel 300mg . Patient lost her medication. Please notify Dr. Danella Penton or doctor assisting with this patient that the patient lost her medication which is why the patient is requesting a refill. I called the iPhone and left a voicemail. Please assist, thank you.

## 2018-11-16 NOTE — Telephone Encounter (Signed)
4th call please see previus messages from 11/14/2018. Gisella from [facility] is calling to follow up on a refill for Seroquel. Called urgent line and left a voice mail.

## 2018-11-20 ENCOUNTER — Other Ambulatory Visit: Payer: Self-pay | Admitting: Family Medicine

## 2018-11-20 DIAGNOSIS — F251 Schizoaffective disorder, depressive type: Secondary | ICD-10-CM

## 2018-11-21 ENCOUNTER — Ambulatory Visit: Payer: No Typology Code available for payment source | Admitting: Family Medicine

## 2018-11-21 MED ORDER — QUETIAPINE FUMARATE 200 MG OR TABS
300.0000 mg | ORAL_TABLET | Freq: Every evening | ORAL | 0 refills | Status: DC
Start: 2018-11-21 — End: 2019-02-15

## 2018-11-21 MED ORDER — QUETIAPINE FUMARATE 100 MG OR TABS
100.0000 mg | ORAL_TABLET | Freq: Every morning | ORAL | 0 refills | Status: DC
Start: 2018-11-21 — End: 2019-06-18

## 2018-11-21 NOTE — Telephone Encounter (Signed)
Will approve prescription for one time only. She missed her follow up appointment today. Patient needs to reschedule and follow up. Will route to admin pool to assist with reschedule    Pricilla Loveless, MD   [facility] Family Medicine   Pager: [phone]

## 2018-11-21 NOTE — Telephone Encounter (Signed)
Message forward to Dr. Boneta Lucks T

## 2018-11-21 NOTE — Progress Notes (Deleted)
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    ***    FM PHQ2 09/18/2018 05/22/2018 05/08/2018 05/08/2018 04/14/2018 02/01/2018 10/25/2017   PHQ2 Total 6 6 6  0 6 0 6   Some recent data might be hidden     Past Medical History:  Patient Active Problem List   Diagnosis   . Insomnia due to other mental disorder   . Bilateral carotid artery stenosis   . Schizophrenia (CMS-HCC)   . Stroke (cerebrum) (CMS-HCC)   . COPD with asthma (CMS-HCC)   . Schizoaffective disorder, depressive type (CMS-HCC)   . Bipolar 1 disorder (CMS-HCC)   . Restless leg syndrome   . Posttraumatic stress disorder   . Type 2 diabetes mellitus with diabetic polyneuropathy, without long-term current use of insulin (CMS-HCC)   . OSA (obstructive sleep apnea)   . History of uterine cancer   . Tobacco use disorder   . Morbid obesity with BMI of 40.0-44.9, adult (CMS-HCC)   . Chronic hepatitis C without hepatic coma (CMS-HCC)   . Routine health maintenance   . Essential hypertension         Medications:    Current Outpatient Medications:   .  albuterol 108 (90 Base) MCG/ACT inhaler, Inhale 2 puffs by mouth every 6 hours as needed for Wheezing., Disp: 1 Inhaler, Rfl: 3  .  aspirin 81 MG EC tablet, Take 1 tablet (81 mg) by mouth daily., Disp: 90 tablet, Rfl: 3  .  atorvastatin (LIPITOR) 80 MG tablet, Take 1 tablet (80 mg) by mouth nightly., Disp: 90 tablet, Rfl: 3  .  bisacodyl (DULCOLAX) 5 MG Enteric-Coated tablet, Take 1 tablet (5  mg) by mouth 2 times daily as needed for Constipation., Disp: 60 tablet, Rfl: 3  .  chlorhexidine (PERIDEX) 0.12 % solution, Rinse with 1/2 ounce by mouth for 30 seconds then spit out. use twice a day for 7 days, Disp: , Rfl: 0  .  clopidogrel (PLAVIX) 75 MG tablet, Take 1 tablet (75 mg) by mouth daily., Disp: 90 tablet, Rfl: 3  .  diclofenac (VOLTAREN) 1 % gel, Apply 1 g topically 4 times daily., Disp: 1 Tube, Rfl: 1  .  gabapentin (NEURONTIN) 100 MG capsule, Take 1 capsule (100 mg) by mouth at bedtime., Disp: 90 capsule, Rfl: 3  .  lisinopril (PRINIVIL, ZESTRIL) 20 MG tablet, Take 1 tablet (20 mg) by mouth daily., Disp: 60 tablet, Rfl: 3  .  metFORMIN (GLUCOPHAGE) 1000 MG tablet, Take 1 tablet (1,000 mg) by mouth 2 times daily (with meals)., Disp: 120 tablet, Rfl: 3  .  nicotine (NICOTINE STEP 1) 21 MG/24HR patch, Step 1: apply 1 patch on skin every 24 hours for weeks 1-6 after stop smoking, Disp: 42 patch, Rfl: 0  .  nicotine (NICOTINE STEP 2) 14 MG/24HR, Step 2: apply 1 patch on skin every 24 hours for weeks 7-8 after stop smoking, Disp: 14 patch, Rfl: 0  .  nicotine (NICOTINE STEP 3) 7 MG/24HR, Step 3: apply 1 patch on skin every 24 hours for weeks 9-10 after stop smoking, Disp: 14 patch, Rfl: 0  .  nitroGLYcerin (NITROSTAT) 0.4 MG SL tablet, 1 tablet (0.4 mg) by Sublingual route every 5 minutes as needed for Chest Pain. up to 3 tabs per episode., Disp: 1 bottle, Rfl: 1  .  QUEtiapine (SEROQUEL) 100 MG tablet, Take 1 tablet (100 mg) by mouth every morning., Disp: 60 tablet, Rfl: 0  .  QUEtiapine (SEROQUEL) 200 MG tablet, Take 1.5 tablets (300 mg) by mouth at bedtime., Disp: 90 tablet, Rfl: 0  .  ropinirole (REQUIP) 4 MG tablet, Take 1 tablet (4 mg) by mouth daily., Disp: 90 tablet, Rfl: 3  .  spacer (AEROCHAMBER) MISC, Use as directed, Disp: 1 each, Rfl: 3  .  Spacer/Aero-Holding Chambers (AEROCHAMBER PLUS FLO-VU) MISC, As Directed., Disp: , Rfl: 0  .  terbinafine (LAMISIL) 1 % cream, Apply 1 Application  topically 2 times daily. Use a small amount as directed until resolution, Disp: 42 g, Rfl: 3  .  tiotropium (SPIRIVA RESPIMAT) 1.25 MCG/ACT AERS, Inhale 2 puffs by mouth daily., Disp: 4 g, Rfl: 3    Allergies:  No Known Allergies    SOCIAL HISTORY:      reports that she has been smoking cigarettes. She has a 61.50 pack-year smoking history. She has never used smokeless tobacco. She reports that she does not drink alcohol or use drugs.    Review of Systems:  A 10 system ROS was performed and is negative except as above in HPI    Objective:     LMP  (LMP Unknown)     Physical Exam:  ***  Well developed, in NAD. Conversant, good eye contact.  Heart: regular rate and rhythm. S1 and S2 intensities normal. No murmurs, rubs or gallops.   Chest: clear to auscultation bilaterally. No wheezes, rales or rhonchi  Abdomen: soft, nontender, nondistended. Positive bowel sounds. No rebound, no guarding.   Extremities: no peripheral edema    Labs and Imaging:  ***      Assessment & Plan:     Problem List Items Addressed This Visit     None            The ASCVD Risk score Denman George DC Jr., et al., 2013) failed to calculate for the following reasons:    The patient has a prior MI or stroke diagnosis      Requested Prescriptions      No prescriptions requested or ordered in this encounter       Follow Up: No follow-ups on file.      Patient {bksdiscussedwithattending:[phone]} with attending, Dr. Marland Kitchen

## 2018-11-22 NOTE — Telephone Encounter (Signed)
PT SCHEDULED AN APT WITH DR Elyn Aquas

## 2018-11-28 ENCOUNTER — Ambulatory Visit: Payer: No Typology Code available for payment source | Admitting: Family Medicine

## 2018-11-28 NOTE — Progress Notes (Deleted)
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    # HTN  - Unable to check blood pressures at home secondary to left arm weakness s/p stroke  - Current medications: lisinopril 10mg  QD  - Denies headache, chest pain, shortness of breath, palpitations, lightheadedness, dizziness, leg swelling, or diaphoresis    - Lisinopril increased to 20 mg daily on last visit 10/24/2018       PAST MEDICAL HISTORY:  Patient Active Problem List   Diagnosis   . Insomnia due to other mental disorder   . Bilateral carotid artery stenosis   . Stroke (cerebrum) (CMS-HCC)   . COPD with asthma (CMS-HCC)   . Schizoaffective disorder, depressive type (CMS-HCC)   . Restless leg syndrome   . Posttraumatic stress disorder   . Type 2 diabetes mellitus with diabetic polyneuropathy, without long-term current use of insulin (CMS-HCC)   . OSA (obstructive sleep apnea)   . History of uterine cancer   . Tobacco use disorder   . Morbid obesity with BMI of 40.0-44.9, adult (CMS-HCC)   . Chronic hepatitis C without hepatic coma (CMS-HCC)   . Routine health maintenance   . Essential hypertension       ALLERGIES:  No Known Allergies    MEDICATIONS:   Current Outpatient Medications   Medication Sig Dispense Refill   . albuterol 108 (90 Base) MCG/ACT inhaler Inhale 2 puffs by mouth every 6 hours as needed for Wheezing. 1 Inhaler 3   . aspirin 81 MG EC tablet Take 1 tablet (81 mg) by mouth daily. 90 tablet 3   . atorvastatin (LIPITOR) 80 MG tablet  Take 1 tablet (80 mg) by mouth nightly. 90 tablet 3   . bisacodyl (DULCOLAX) 5 MG Enteric-Coated tablet Take 1 tablet (5 mg) by mouth 2 times daily as needed for Constipation. 60 tablet 3   . chlorhexidine (PERIDEX) 0.12 % solution Rinse with 1/2 ounce by mouth for 30 seconds then spit out. use twice a day for 7 days  0   . clopidogrel (PLAVIX) 75 MG tablet Take 1 tablet (75 mg) by mouth daily. 90 tablet 3   . diclofenac (VOLTAREN) 1 % gel Apply 1 g topically 4 times daily. 1 Tube 1   . gabapentin (NEURONTIN) 100 MG capsule Take 1 capsule (100 mg) by mouth at bedtime. 90 capsule 3   . lisinopril (PRINIVIL, ZESTRIL) 20 MG tablet Take 1 tablet (20 mg) by mouth daily. 60 tablet 3   . metFORMIN (GLUCOPHAGE) 1000 MG tablet Take 1 tablet (1,000 mg) by mouth 2 times daily (with meals). 120 tablet 3   . nicotine (NICOTINE STEP 1) 21 MG/24HR patch Step 1: apply 1 patch on skin every 24 hours for weeks 1-6 after stop smoking 42 patch 0   . nicotine (NICOTINE STEP 2) 14 MG/24HR Step 2: apply 1 patch on skin every 24 hours for weeks 7-8 after stop smoking 14 patch 0   .  nicotine (NICOTINE STEP 3) 7 MG/24HR Step 3: apply 1 patch on skin every 24 hours for weeks 9-10 after stop smoking 14 patch 0   . nitroGLYcerin (NITROSTAT) 0.4 MG SL tablet 1 tablet (0.4 mg) by Sublingual route every 5 minutes as needed for Chest Pain. up to 3 tabs per episode. 1 bottle 1   . QUEtiapine (SEROQUEL) 100 MG tablet Take 1 tablet (100 mg) by mouth every morning. 60 tablet 0   . QUEtiapine (SEROQUEL) 200 MG tablet Take 1.5 tablets (300 mg) by mouth at bedtime. 90 tablet 0   . ropinirole (REQUIP) 4 MG tablet Take 1 tablet (4 mg) by mouth daily. 90 tablet 3   . spacer (AEROCHAMBER) MISC Use as directed 1 each 3   . Spacer/Aero-Holding Chambers (AEROCHAMBER PLUS FLO-VU) MISC As Directed.  0   . terbinafine (LAMISIL) 1 % cream Apply 1 Application topically 2 times daily. Use a small amount as directed until resolution 42 g 3   . tiotropium (SPIRIVA  RESPIMAT) 1.25 MCG/ACT AERS Inhale 2 puffs by mouth daily. 4 g 3     No current facility-administered medications for this visit.        REVIEW OF SYSTEMS:  10 system ROS was performed. Pertinent positives/negatives have been indicated as above.    Objective:     PHYSICAL EXAMINATION:  LMP  (LMP Unknown)     General: No acute distress.  HEENT: NC/AT. PERRL. EOMi. MMM, clear oropharynx. No thyromegally or nodules. No lymphadenopathy  CV: RRR no MRG  Pulm: CTAB no WRR  Abd: Soft, NT/ND. Bowel sounds normal. No rebound/guarding.    Extremities: Warm, well perfused. No peripheral edema. Pulses are 2+ equal and symmetric   Skin: No rashes, lesions, breakdown      LABS/IMAGING:  ***    Assessment/Plan:    Diagnoses and all orders for this visit:    Essential hypertension          #RTC: No follow-ups on file.    Discussed the importance of monitoring labs as well as medication compliance and the risks of significant morbidity and mortality if not done  Patient was given strict return precautions should symptoms worsen or fail to improve for any concerns.    Patient {bksdiscussedwithattending:[phone]} with attending, Dr. Marland Kitchen

## 2018-12-04 ENCOUNTER — Ambulatory Visit: Payer: No Typology Code available for payment source

## 2018-12-22 ENCOUNTER — Telehealth: Payer: Self-pay | Admitting: Family Medicine

## 2018-12-22 DIAGNOSIS — E1142 Type 2 diabetes mellitus with diabetic polyneuropathy: Secondary | ICD-10-CM

## 2018-12-22 DIAGNOSIS — I6523 Occlusion and stenosis of bilateral carotid arteries: Secondary | ICD-10-CM

## 2018-12-22 MED ORDER — CLOPIDOGREL BISULFATE 75 MG OR TABS
75.0000 mg | ORAL_TABLET | Freq: Every day | ORAL | 3 refills | Status: DC
Start: 2018-12-22 — End: 2019-02-15

## 2018-12-22 MED ORDER — METFORMIN HCL 1000 MG OR TABS
1000.0000 mg | ORAL_TABLET | Freq: Two times a day (BID) | ORAL | 3 refills | Status: DC
Start: 2018-12-22 — End: 2019-07-23

## 2018-12-22 NOTE — Telephone Encounter (Signed)
Telephone Encounter Note for Medication Refill (12/22/2018)    [ 2:26PM ] Received call from patient requesting refill on metformin and plavix. Discussed COVID-19 safety and precautions. She requested follow up appointment with me, but has an appointment with Dr. Elyn Aquas on 01/09/19. Advised patient to call front desk to set up appointment with me. However, advised that it would likely be a telephone encounter.    [ 2:36 PM ] Call ended.    Pricilla Loveless, MD   [facility]   Pager: [phone]

## 2018-12-26 ENCOUNTER — Telehealth: Payer: Self-pay | Admitting: Family Medicine

## 2018-12-26 NOTE — Telephone Encounter (Signed)
Patient requesting an appointment via telephone with Dr Pricilla Loveless. Please assist

## 2018-12-27 ENCOUNTER — Ambulatory Visit: Payer: Self-pay | Admitting: Family Medicine

## 2018-12-27 NOTE — Telephone Encounter (Signed)
Called patient. No answer. Left message with call back number.  RCornejo, RN

## 2018-12-27 NOTE — Telephone Encounter (Signed)
Call pt  Left message to please call us back, we need a reason for apt request does pt need to be triage by RN or she needs to be seen.

## 2018-12-27 NOTE — Telephone Encounter (Signed)
Patient is calling for nurse advise. She states she had a dizzy spell last night. She said she fainted and was out about a min. Can you please assist, with triage. Thank you.

## 2018-12-28 NOTE — Telephone Encounter (Signed)
No return call. Will close encounter.  RCornejo, RN

## 2018-12-28 NOTE — Telephone Encounter (Signed)
Called patient. No answer. Left message with call back number.  RCornejo, RN

## 2019-01-08 NOTE — Progress Notes (Addendum)
[facility]   [facility]     I called the patient by telephone and we discussed the following items in detail:     Patient: Mina Ward  DOB: 1961/04/28  Primary Care Provider: Radonna Ricker    Subjective:        HPI: Michellee Junious is a 58 year old female with schizoaffective disorder, PTSD, COPD, b/l carotid stenosis s/p R thromboendarterectomy 12/2016, CVA 2018 with left sided residual weakness, chronic hep C, T2DM, HTN who presents with chief complaint of Cough and Diabetes    Last seen by Dr. Pricilla Loveless 10/24/2018 for HTN, right hand pain possibly carpal tunnel with + Tinel and given wrist brace and ordered EMG studies, given nicotine patch, refilled clopidogrel.     #T2DM  Patient does not check glucose at home, does not have a glucometer. Patient denies hypoglycemic episodes. Denies polyphagia, polydipsia, polyuria.   Last A1C:   Lab Results   Component Value Date    A1C 6.4 (H) 07/10/2018    A1C 8.3 (H) 05/08/2018    A1C 8.8 (H) 04/07/2018     Meds: metformin 1g BID     Microvascular screening  Retinopathy: Last eye exam: Needs referral today   Neuropathy: Foot exam: Monofilament intact [01/09/2019]  Nephropathy:   Lab Results   Component Value Date    MCR 4.5 04/07/2018     # Cough  - Ongoing for >4 months, dry cough. Per chart review had cough since at least October 2019.   - Smokes 1 pack a day, smoking for 40 years. Smoked 2 packs a day for most of the 40 years.   - No allergies or reflux symptoms.   - Given nicotine patch on last visit, not helping too much   - Had PFT's 08/2018 for workup, normal spirometry.   - Had low dose CT chest 07/2018, see below   - Denies SOB, CP, purulent sputum, bloody sputum     # Living Situation/Mental Health  - Has been accepted to the [facility] program. Per patient, this program will help with mental health issues as well as patient's living situation. Patient will follow up 10/31/18 for further evaluation  - Patient is still currently at the  same residence  - Patient states she still feels depressed but not worse from last visit. No thoughts of self harm or suicidal ideation      PAST MEDICAL HISTORY:  Patient Active Problem List   Diagnosis   . Insomnia due to other mental disorder   . Bilateral carotid artery stenosis   . Stroke (cerebrum) (CMS-HCC)   . COPD with asthma (CMS-HCC)   . Schizoaffective disorder, depressive type (CMS-HCC)   . Restless leg syndrome   . Posttraumatic stress disorder   . Type 2 diabetes mellitus with diabetic polyneuropathy, without long-term current use of insulin (CMS-HCC)   . OSA (obstructive sleep apnea)   . History of uterine cancer   . Tobacco use disorder   . Morbid obesity with BMI of 40.0-44.9, adult (CMS-HCC)   . Chronic hepatitis C without hepatic coma (CMS-HCC)   . Routine health maintenance   . Essential hypertension       ALLERGIES:  No Known Allergies    MEDICATIONS:   Current Outpatient Medications   Medication Sig Dispense Refill   . albuterol 108 (90 Base) MCG/ACT inhaler Inhale 2 puffs by mouth every 6 hours as needed for Wheezing. 1 Inhaler 3   .  aspirin 81 MG EC tablet Take 1 tablet (81 mg) by mouth daily. 90 tablet 3   . atorvastatin (LIPITOR) 80 MG tablet Take 1 tablet (80 mg) by mouth nightly. 90 tablet 3   . bisacodyl (DULCOLAX) 5 MG Enteric-Coated tablet Take 1 tablet (5 mg) by mouth 2 times daily as needed for Constipation. 60 tablet 3   . blood glucose meter Check once daily fasting glucose 1 Device 0   . chlorhexidine (PERIDEX) 0.12 % solution Rinse with 1/2 ounce by mouth for 30 seconds then spit out. use twice a day for 7 days  0   . clopidogrel (PLAVIX) 75 MG tablet Take 1 tablet (75 mg) by mouth daily. 90 tablet 3   . diclofenac (VOLTAREN) 1 % gel Apply 1 g topically 4 times daily. 1 Tube 1   . gabapentin (NEURONTIN) 100 MG capsule Take 1 capsule (100 mg) by mouth at bedtime. 90 capsule 3   . glucose blood test strip 1 strip by Other route every morning (before breakfast). 100 strip 5   .  lancets 1 each by Other route every morning (before breakfast). 100 Lancet 5   . lisinopril (PRINIVIL, ZESTRIL) 20 MG tablet Take 1 tablet (20 mg) by mouth daily. 60 tablet 3   . metFORMIN (GLUCOPHAGE) 1000 MG tablet Take 1 tablet (1,000 mg) by mouth 2 times daily (with meals). 180 tablet 3   . nicotine (NICOTINE STEP 1) 21 MG/24HR patch Step 1: apply 1 patch on skin every 24 hours for weeks 1-6 after stop smoking 42 patch 0   . nicotine (NICOTINE STEP 2) 14 MG/24HR Step 2: apply 1 patch on skin every 24 hours for weeks 7-8 after stop smoking 14 patch 0   . nicotine (NICOTINE STEP 3) 7 MG/24HR Step 3: apply 1 patch on skin every 24 hours for weeks 9-10 after stop smoking 14 patch 0   . nitroGLYcerin (NITROSTAT) 0.4 MG SL tablet 1 tablet (0.4 mg) by Sublingual route every 5 minutes as needed for Chest Pain. up to 3 tabs per episode. 1 bottle 1   . QUEtiapine (SEROQUEL) 100 MG tablet Take 1 tablet (100 mg) by mouth every morning. 60 tablet 0   . QUEtiapine (SEROQUEL) 200 MG tablet Take 1.5 tablets (300 mg) by mouth at bedtime. 90 tablet 0   . ropinirole (REQUIP) 4 MG tablet Take 1 tablet (4 mg) by mouth daily. 90 tablet 3   . spacer (AEROCHAMBER) MISC Use as directed 1 each 3   . Spacer/Aero-Holding Chambers (AEROCHAMBER PLUS FLO-VU) MISC As Directed.  0   . terbinafine (LAMISIL) 1 % cream Apply 1 Application topically 2 times daily. Use a small amount as directed until resolution 42 g 3   . tiotropium (SPIRIVA RESPIMAT) 1.25 MCG/ACT AERS Inhale 2 puffs by mouth daily. 4 g 3     No current facility-administered medications for this visit.        REVIEW OF SYSTEMS:  10 system ROS was performed. Pertinent positives/negatives have been indicated as above.    Objective:     PHYSICAL EXAMINATION:  BP 123/85 (BP Location: Left arm, BP Patient Position: Sitting, BP cuff size: Regular)   Pulse 67   Temp 98.5 F (36.9 C) (Oral)   Resp 18   Ht 5\' 2"  (1.575 m)   Wt 90 kg (198 lb 6.6 oz)   LMP  (LMP Unknown)   SpO2 100%    BMI 36.29 kg/m     General: No acute  distress.  HEENT: NC/AT. PERRL. EOMi. MMM, clear oropharynx. No thyromegally or nodules. No lymphadenopathy  CV: RRR no MRG  Pulm: CTAB no WRR  Abd: Soft, NT/ND. Bowel sounds normal. No rebound/guarding.    Extremities: Warm, well perfused. No peripheral edema. Pulses are 2+ equal and symmetric. + finger clubbing  Skin: No rashes, lesions, breakdown      LABS/IMAGING:  Lab Results   Component Value Date    K 4.1 05/08/2018    CL 108 (H) 05/08/2018    BUN 13 05/08/2018    CREAT 0.9 05/08/2018    GLU 137 (H) 05/08/2018    [place] 9.6 05/08/2018     Lab Results   Component Value Date    A1C 6.4 (H) 07/10/2018    A1C 8.3 (H) 05/08/2018    A1C 8.8 (H) 04/07/2018         Assessment/Plan:    Brileigh was seen today for cough and diabetes.    Diagnoses and all orders for this visit:    Type 2 diabetes mellitus with diabetic polyneuropathy, without long-term current use of insulin (CMS-HCC)  Comments:   Well controlled on metformin monotherapy. Goal of 7.   - c/w current regimen  - sent glucometer for patient to check at home prn   - screen for TB for homeless diabetic patient   Orders:  -     Glycosylated Hgb(A1C), Blood Lavender; Future  -     Quantiferon-TB, Blood Quantiferon Tube Set; Future  -     glucose blood test strip; 1 strip by Other route every morning (before breakfast).  -     lancets; 1 each by Other route every morning (before breakfast).  -     blood glucose meter; Check once daily fasting glucose    Essential hypertension  Comments:   Well controlled on today's visit     Chronic cough  Tobacco use disorder  Comments:   Per patient ongoing for ~4 months. Long history of smoking, ~80 pack year history. Lung exam unremarkable, no GERD/allergy sx, does have finger clubbing. For workup patient has undergone low dose CT chest 07/2018 for lung cancer screening with some component of possible ILD on imaging. PFT's were completed 08/2018 which were normal. Cough likely due to tobacco  use  - Advise to stop smoking, pt prescribed nicotine patch by PCP. Declined SBIRT counselor today   - Consider varenicline at future visit   - F/u with PCP   - Rec repeat CT chest 07/2019       #RTC: Return in about 6 weeks (around 02/20/2019) for f/u cough Dr. Pricilla Loveless only .    Discussed the importance of monitoring labs as well as medication compliance and the risks of significant morbidity and mortality if not done  Patient was given strict return precautions should symptoms worsen or fail to improve for any concerns.    Patient discussed with attending, Dr. Tawni Millers   ----------------------------------------------------------------------------------------------------    Attending Note:     Agree with the above documentation regarding this patient. Patient was discussed with the resident physician. The assessment and plan was then jointly formulated with the resident physician.     Subjective: Please see resident note for further details, but briefly, Aleese Kamps is a 58 year old female with schizoaffective disorder, PTSD, COPD, CVA, chronic hep C, T2DM, HTN and bilateral carotid stenosis with right thrombectomy here for:    Here for follow up.  Was supposed to be telemedicine visit but  patient came in and was seen.    For type 2 DM does not check sugars at home.  Last HGBA1C 10/19 and was 6.4.  No hypoglycemia or polydipsia or polyuria.  On metformin.  Needs referral for eye exam.      Cough for over 4 months.  Still smoking.  Trying nicotine patch but not helping much.  Prior low dose CT scan screening.      Accepted into [facility] program that will help with her mental health issues as well as her living situation.  Patient has some depression but no SI/HI.        Objective: BP 123/85 (BP Location: Left arm, BP Patient Position: Sitting, BP cuff size: Regular)   Pulse 67   Temp 98.5 F (36.9 C) (Oral)   Resp 18   Ht  (1.575 m)   Wt 90 kg (198 lb 6.6 oz)   LMP  (LMP Unknown)   SpO2 100%    BMI 36.29 kg/m  as per resident physician.   Relevant details below:     General exam is unremarkable.  Some finger clubbing.      LABS:   Lab Results   Component Value Date    A1C 6.4 (H) 07/10/2018        Assessment/Plan: I agree with resident's assessment and plan of:     1. Type 2 diabetes mellitus with diabetic polyneuropathy, without long-term current use of insulin (CMS-HCC)  Currently seems to be well controlled.  Try to order supplies so can do blood sugar monitoring.  Continue metformin.  Tb screening.    - Glycosylated Hgb(A1C), Blood Lavender; Future  - Quantiferon-TB, Blood Quantiferon Tube Set; Future  - glucose blood test strip; 1 strip by Other route every morning (before breakfast).  Dispense: 100 strip; Refill: 5  - lancets; 1 each by Other route every morning (before breakfast).  Dispense: 100 Lancet; Refill: 5  - blood glucose meter; Check once daily fasting glucose  Dispense: 1 Device; Refill: 0    2. Essential hypertension  Controlled on today's visit.    3. Chronic cough  4. Tobacco use disorder    These two probably related.  Work on smoking cessation.  Trying the patch but if not helpful maybe consider Chantix.  Previous CT scan screen and consider repeat later this year.     Gladis Riffle, M.D.,   Clinical Professor   [facility] Department of [facility]

## 2019-01-09 ENCOUNTER — Encounter: Payer: Self-pay | Admitting: Family Medicine

## 2019-01-09 ENCOUNTER — Ambulatory Visit: Payer: No Typology Code available for payment source | Attending: Family Medicine | Admitting: Family Medicine

## 2019-01-09 VITALS — BP 123/85 | HR 67 | Temp 98.5°F | Resp 18 | Ht 62.0 in | Wt 198.4 lb

## 2019-01-09 DIAGNOSIS — R05 Cough: Secondary | ICD-10-CM | POA: Insufficient documentation

## 2019-01-09 DIAGNOSIS — E1142 Type 2 diabetes mellitus with diabetic polyneuropathy: Secondary | ICD-10-CM

## 2019-01-09 DIAGNOSIS — I10 Essential (primary) hypertension: Secondary | ICD-10-CM | POA: Insufficient documentation

## 2019-01-09 DIAGNOSIS — R053 Chronic cough: Secondary | ICD-10-CM

## 2019-01-09 DIAGNOSIS — F17200 Nicotine dependence, unspecified, uncomplicated: Secondary | ICD-10-CM

## 2019-01-09 DIAGNOSIS — Z6836 Body mass index (BMI) 36.0-36.9, adult: Secondary | ICD-10-CM | POA: Insufficient documentation

## 2019-01-09 MED ORDER — LANCETS MISC
1.0000 | Freq: Every day | 5 refills | Status: DC
Start: 2019-01-09 — End: 2019-04-16

## 2019-01-09 MED ORDER — BLOOD GLUCOSE METER KIT
PACK | 0 refills | Status: DC
Start: 2019-01-09 — End: 2019-04-16

## 2019-01-09 MED ORDER — GLUCOSE BLOOD VI STRP
1.0000 | ORAL_STRIP | Freq: Every day | 5 refills | Status: DC
Start: 2019-01-09 — End: 2019-04-16

## 2019-01-30 NOTE — Progress Notes (Signed)
Attending Note:    Subjective:  I reviewed the history.  Patient interviewed and examined.  History of present illness (HPI):  Cough and Diabetes     Review of Systems (ROS): As per the resident's note.  Past Medical, Family, Social History:  As per the resident's  note.    Objective:    I concur with the resident's exam.    Assessment and plan reviewed with the resident physician.  I agree with the resident's plan as documented.    See the resident's note for further details.

## 2019-02-07 ENCOUNTER — Telehealth: Payer: Self-pay | Admitting: Family Medicine

## 2019-02-07 DIAGNOSIS — B182 Chronic viral hepatitis C: Secondary | ICD-10-CM

## 2019-02-07 DIAGNOSIS — Z0000 Encounter for general adult medical examination without abnormal findings: Secondary | ICD-10-CM

## 2019-02-07 DIAGNOSIS — J449 Chronic obstructive pulmonary disease, unspecified: Secondary | ICD-10-CM

## 2019-02-07 NOTE — Telephone Encounter (Signed)
Forward message to Dr. Irving Burton. For advise Patient has an appointment schedule for 02/15/2019 but its a telemedicine appointment.

## 2019-02-07 NOTE — Telephone Encounter (Signed)
Pt is wanting to make a face to face appt for in home health services paper work to be filled out she states she needs this to be filled out by 05 15 2020 she needs to turn this in to social services.

## 2019-02-08 NOTE — Telephone Encounter (Signed)
Home Health Services form received place on Dr. Irving Burton. Box For review

## 2019-02-08 NOTE — Telephone Encounter (Signed)
Spoke to patient gave Dr. Irving Burton. Message home health services will be faxed by them and they will give [place] a call when forms are fax. Remind patient for her telemedicine appointment for 02/15/2019 patient understood.       Pricilla Loveless, MD 8 minutes ago (10:16 AM)         02/08/2019 10:15 AM    Please inform patient that if she has someone that can drop off the forms, I can fill them out and call her for any questions I may have. Thank you for assistance.    Pricilla Loveless, MD   [facility]   Pager: [phone]          Documentation

## 2019-02-08 NOTE — Telephone Encounter (Signed)
02/08/2019 10:15 AM    Please inform patient that if she has someone that can drop off the forms, I can fill them out and call her for any questions I may have. Thank you for assistance.    Pricilla Loveless, MD   [facility]   Pager: [phone]

## 2019-02-12 ENCOUNTER — Telehealth: Payer: Self-pay | Admitting: Family Medicine

## 2019-02-12 NOTE — Telephone Encounter (Signed)
Forms have been faxed back on 02/12/2019 received confirmation

## 2019-02-12 NOTE — Telephone Encounter (Signed)
Judeth Cornfield from [facility] is requesting a call back from Araceli in regards to fax. She states she has not received anything. Please follow up at [phone]

## 2019-02-12 NOTE — Telephone Encounter (Signed)
See previous message 02/07/2019. Spoke to [place] aware fax has been send on 02/12/2019

## 2019-02-12 NOTE — Telephone Encounter (Signed)
Judeth Cornfield from [employer] would like to know if they received the forms she faxed over. She keeps getting an error message. She would like Araceli to follow up with her, thank you.

## 2019-02-12 NOTE — Telephone Encounter (Signed)
Judeth Cornfield from [employer] forms given to Dr. Irving Burton. For review

## 2019-02-12 NOTE — Telephone Encounter (Signed)
Forms are not in my inbox. Discussed with Araceli who will call again for forms. Assistance appreciated.    Pricilla Loveless, MD   [facility]   Pager: [phone]

## 2019-02-12 NOTE — Progress Notes (Addendum)
Telemedicine Encounter Note - COVID-19 Precautions  [place]    The following medical visit occurred in the form of a telemedicine visit instead of an in person face-to-face visit.     Due to COVID-19 pandemic and a federally declared state of public health emergency, this telemedicine visit was conducted Audio Only.   Patient confirmed their presence in the state of [place] and has consented to proceeding with telemedicine today. Total time spent was 11-20 minutes.      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 This is a follow up for medical refills and forms    Last seen by Dr Regina Ward 01/09/19. Patient complained of ongoing cough ~October 2019.    [ 9:16 AM ] Left voice message. Will call again closer to 10:40 AM appointment time    [ 9:17 AM ] Called a different number: [phone]. Connected to patient    Patient requesting to refax form for IHSS and medication refill.    Concerned that the nicotine patches are not working. She was previously smoking 5-6 cigarettes per day and now is on 1 PPD. She is interested in other medication opportunities such as chantix. Has tried nicotine gum in the past with no improvement.    [ 9:31 AM ] Call ended.     Past Medical History:  Patient Active Problem List   Diagnosis   . Insomnia due to other mental disorder   . Bilateral carotid artery stenosis   . Stroke (cerebrum) (CMS-HCC)   . COPD with asthma (CMS-HCC)   . Schizoaffective disorder, depressive type (CMS-HCC)   .  Restless leg syndrome   . Posttraumatic stress disorder   . Type 2 diabetes mellitus with diabetic polyneuropathy, without long-term current use of insulin (CMS-HCC)   . OSA (obstructive sleep apnea)   . History of uterine cancer   . Tobacco use disorder   . Morbid obesity with BMI of 40.0-44.9, adult (CMS-HCC)   . Chronic hepatitis C without hepatic coma (CMS-HCC)   . Routine health maintenance   . Essential hypertension       Past Surgical History:   Procedure Laterality Date   . THROMBOENDARTERECTOMY Right 12/2016    Carotd endarterectomy   . TOTAL ABDOMINAL HYSTERECTOMY W/ BILATERAL SALPINGOOPHORECTOMY  1993        Family History   Problem Relation Name Age of Onset   . Diabetes Mother          Leg amputation   . Stroke Father  84   . Heart Disease Brother     . Stroke Maternal Grandmother     . Heart Attack Maternal Grandmother          Current Outpatient Medications:   .  albuterol 108 (90 Base) MCG/ACT inhaler, Inhale 2 puffs by mouth every 6 hours as needed for Wheezing., Disp: 1 Inhaler, Rfl: 3  .  aspirin 81 MG EC tablet, Take 1 tablet (81 mg) by mouth daily., Disp: 90 tablet, Rfl: 3  .  atorvastatin (  LIPITOR) 80 MG tablet, Take 1 tablet (80 mg) by mouth nightly., Disp: 90 tablet, Rfl: 3  .  bisacodyl (DULCOLAX) 5 MG Enteric-Coated tablet, Take 1 tablet (5 mg) by mouth 2 times daily as needed for Constipation., Disp: 60 tablet, Rfl: 3  .  blood glucose meter, Check once daily fasting glucose, Disp: 1 Device, Rfl: 0  .  chlorhexidine (PERIDEX) 0.12 % solution, Rinse with 1/2 ounce by mouth for 30 seconds then spit out. use twice a day for 7 days, Disp: , Rfl: 0  .  clopidogrel (PLAVIX) 75 MG tablet, Take 1 tablet (75 mg) by mouth daily., Disp: 90 tablet, Rfl: 3  .  diclofenac (VOLTAREN) 1 % gel, Apply 1 g topically 4 times daily., Disp: 1 Tube, Rfl: 1  .  gabapentin (NEURONTIN) 100 MG capsule, Take 1 capsule (100 mg) by mouth at bedtime., Disp: 90 capsule, Rfl: 3  .  glucose blood test strip, 1 strip by  Other route every morning (before breakfast)., Disp: 100 strip, Rfl: 5  .  lancets, 1 each by Other route every morning (before breakfast)., Disp: 100 Lancet, Rfl: 5  .  lisinopril (PRINIVIL, ZESTRIL) 20 MG tablet, Take 1 tablet (20 mg) by mouth daily., Disp: 60 tablet, Rfl: 3  .  metFORMIN (GLUCOPHAGE) 1000 MG tablet, Take 1 tablet (1,000 mg) by mouth 2 times daily (with meals)., Disp: 180 tablet, Rfl: 3  .  nicotine (NICOTINE STEP 1) 21 MG/24HR patch, Step 1: apply 1 patch on skin every 24 hours for weeks 1-6 after stop smoking, Disp: 42 patch, Rfl: 0  .  nicotine (NICOTINE STEP 2) 14 MG/24HR, Step 2: apply 1 patch on skin every 24 hours for weeks 7-8 after stop smoking, Disp: 14 patch, Rfl: 0  .  nicotine (NICOTINE STEP 3) 7 MG/24HR, Step 3: apply 1 patch on skin every 24 hours for weeks 9-10 after stop smoking, Disp: 14 patch, Rfl: 0  .  nitroGLYcerin (NITROSTAT) 0.4 MG SL tablet, 1 tablet (0.4 mg) by Sublingual route every 5 minutes as needed for Chest Pain. up to 3 tabs per episode., Disp: 1 bottle, Rfl: 1  .  QUEtiapine (SEROQUEL) 100 MG tablet, Take 1 tablet (100 mg) by mouth every morning., Disp: 60 tablet, Rfl: 0  .  QUEtiapine (SEROQUEL) 200 MG tablet, Take 1.5 tablets (300 mg) by mouth at bedtime., Disp: 90 tablet, Rfl: 0  .  ropinirole (REQUIP) 4 MG tablet, Take 1 tablet (4 mg) by mouth daily., Disp: 90 tablet, Rfl: 3  .  spacer (AEROCHAMBER) MISC, Use as directed, Disp: 1 each, Rfl: 3  .  Spacer/Aero-Holding Chambers (AEROCHAMBER PLUS FLO-VU) MISC, As Directed., Disp: , Rfl: 0  .  terbinafine (LAMISIL) 1 % cream, Apply 1 Application topically 2 times daily. Use a small amount as directed until resolution, Disp: 42 g, Rfl: 3  .  tiotropium (SPIRIVA RESPIMAT) 1.25 MCG/ACT AERS, Inhale 2 puffs by mouth daily., Disp: 4 g, Rfl: 3    Allergies:  No Known Allergies    SOCIAL HISTORY:      reports that she has been smoking cigarettes. She has a 61.50 pack-year smoking history. She has never used smokeless  tobacco. She reports that she does not drink alcohol or use drugs.    Review of Systems:  A 10 system ROS was performed and is negative except as above in HPI    Physical Examination:  No vitals signs and no physical examination done as discussion was not face-to-face  Labs/Imaging:  Lab Results   Component Value Date    WBCCOUNT 9.6 05/23/2018    HGB 12.6 05/23/2018    HCT 36.9 05/23/2018    PLT 229 05/23/2018     Lab Results   Component Value Date    SODIUM 139 05/08/2018    K 4.1 05/08/2018    CL 108 (H) 05/08/2018    CO2 27 05/08/2018    BUN 13 05/08/2018    CREAT 0.9 05/08/2018    GFR >60 05/08/2018    GLU 137 (H) 05/08/2018     Lab Results   Component Value Date    TPROT 7.2 05/08/2018    ALB 4.2 05/08/2018    ALK 63 05/08/2018    ALT 41 05/08/2018    AST 40 (H) 05/08/2018    TBILI 0.6 05/08/2018     Lab Results   Component Value Date    CHOL 105 04/07/2018    TRIG 83 04/07/2018    HDLCH2 37 (L) 04/07/2018    LDLCALC 51 04/07/2018     Lab Results   Component Value Date    A1C 6.4 (H) 07/10/2018     Assessment/Plan:    # Encounter for completion of form with patient  See telephone note 02/13/19. IHSS forms faxed back to [facility] agency    # Schizoaffective disorder, depressive type (CMS-HCC)  Refill medications. Will refer to our SW to assist with reconnecting patient to a psychiatrist. Was under the impression that patient would be transitioned from [place] psychiatry to [facility] psychiatry. Patient states she is in dire need of a psychiatrist and would like assistance with establishing care once again  Orders  - QUEtiapine (SEROQUEL) 200 MG tablet; Take 1 tablet (200 mg) by mouth at bedtime.  Dispense: 60 tablet; Refill: 0  - Consult/referral to [place]    # Tobacco use disorder  Currently 1 PPD (increased from previously 5-6 cigarettes per day). Nicotine patches are not helping. She is interested in other medication recommendations. I considered chantix however given  history of strokes as well as significant cardiovascular disease, it appears to be contraindicated. Will refer to Psychiatry for further recommendations as to whether it is safe to start chantix and for other recommendations  Orders  - Consult/referral to [facility]    # Cerebrovascular accident (CVA) due to bilateral stenosis of carotid arteries (CMS-HCC)  - aspirin 81 MG EC tablet; Take 1 tablet (81 mg) by mouth daily.  Dispense: 90 tablet; Refill: 3  - atorvastatin (LIPITOR) 80 MG tablet; Take 1 tablet (80 mg) by mouth nightly.  Dispense: 90 tablet; Refill: 3  - clopidogrel (PLAVIX) 75 MG tablet; Take 1 tablet (75 mg) by mouth daily.  Dispense: 90 tablet; Refill: 3    # Essential hypertension  - lisinopril (PRINIVIL, ZESTRIL) 20 MG tablet; Take 1 tablet (20 mg) by mouth daily.  Dispense: 60 tablet; Refill: 3    # Slow transit constipation  - bisacodyl (DULCOLAX) 5 MG Enteric-Coated tablet; Take 1 tablet (5 mg) by mouth 2 times daily as needed for Constipation.  Dispense: 60 tablet; Refill: 3    # Restless leg syndrome  - ropinirole (REQUIP) 4 MG tablet; Take 1 tablet (4 mg) by mouth daily.  Dispense: 90 tablet; Refill: 3      All the above issues were fully discussed with patient during our telephone conversation and patient is aware of all of these issues as outlined above.  Patient discussed with attending, Dr. Azucena Freed    Follow Up: Return in about 2 weeks (around 03/01/2019) for f/u chronic medical conditions.       ----------------------------------------------------------------------------------------------------    Attending Note:     Agree with the above documentation regarding this patient. Patient was discussed with the Ward physician. The assessment and plan was then jointly formulated with the Ward physician.     Subjective: Please see Ward note for further details, but briefly, Micah Galeno is a 58 year old female with multiple medical problems here for:      Follow up  for med refills and complete form for in home support services.  Form completed.  She is on multiple meds for various medical problems.    Smoker and smoking more.  She had tapered down but now has increased her smoking.  Nicotine patch but says does not work.    Schizoaffective disorder.  Was homeless but got her a place to live.    Staying at [facility] and supposed to see a psychiatrist there.  However, [facility] has not provided the psych care at this time and not sure why.  Due to muliple medical issues she is only on seroquel and seems to benefit from regular contact with doctor and psych follow up to keep her better controlled.          Objective:    No physical exam as this was a telephone encounter.              LABS:   Lab Results   Component Value Date    A1C 6.4 (H) 07/10/2018        Assessment/Plan: I agree with Ward's assessment and plan of:     1. Schizoaffective disorder, depressive type (CMS-HCC)  Will try referral to our social worker to assist in reconnecting patient to a psychiatrist for ongoing care.  Patient desires to see a psychiatrist.  Continue on current medications.  Will also refer to our psychiatrist here for help with her tobacco use disorder and psychiatric issues.  - QUEtiapine (SEROQUEL) 200 MG tablet; Take 1 tablet (200 mg) by mouth at bedtime.  Dispense: 60 tablet; Refill: 0  - Consult/referral to [place]    2. Tobacco use disorder  Increased smoking.  Referral to Psych clinic to see if can consider additional meds but also work with her to reduce psych issues that may make it easier for her to stop smoking.    - Consult/referral to [place]    3. COPD with asthma (CMS-HCC)      4. Cerebrovascular accident (CVA) due to bilateral stenosis of carotid arteries (CMS-HCC)    - aspirin 81 MG EC tablet; Take 1 tablet (81 mg) by mouth daily.  Dispense: 90 tablet; Refill: 3  - atorvastatin (LIPITOR) 80 MG tablet; Take 1 tablet (80 mg) by mouth nightly.  Dispense:  90 tablet; Refill: 3  Also clopidogrel 75 milligram tablet.  Takes 1 tablet daily.    5. Bilateral carotid artery stenosis    - atorvastatin (LIPITOR) 80 MG tablet; Take 1 tablet (80 mg) by mouth nightly.  Dispense: 90 tablet; Refill: 3  - clopidogrel (PLAVIX) 75 MG tablet; Take 1 tablet (75 mg) by mouth daily.  Dispense: 90 tablet; Refill: 3    6. Essential hypertension    - lisinopril (PRINIVIL, ZESTRIL) 20 MG tablet; Take 1 tablet (20 mg) by mouth daily.  Dispense: 60 tablet; Refill: 3    7. Slow  transit constipation    - bisacodyl (DULCOLAX) 5 MG Enteric-Coated tablet; Take 1 tablet (5 mg) by mouth 2 times daily as needed for Constipation.  Dispense: 60 tablet; Refill: 3    8. Restless leg syndrome    - ropinirole (REQUIP) 4 MG tablet; Take 1 tablet (4 mg) by mouth daily.  Dispense: 90 tablet; Refill: 3      Earney Navy, M.D.,   Clinical Professor   [facility] Department of [facility]

## 2019-02-12 NOTE — Telephone Encounter (Addendum)
Forms completed. Placed in MA/staff inbox to fax back.    Pricilla Loveless, MD   [facility]   Pager: [phone]

## 2019-02-13 ENCOUNTER — Telehealth: Payer: Self-pay | Admitting: Family Medicine

## 2019-02-13 NOTE — Telephone Encounter (Signed)
Called Nurse, mental health for [employer] agency no answer answer left voice mail message.

## 2019-02-13 NOTE — Telephone Encounter (Signed)
Forward message to Dr. Irving Burton. For review     Copy of the form place on Dr. Irving Burton. Box

## 2019-02-13 NOTE — Telephone Encounter (Signed)
Judeth Cornfield Child psychotherapist for [employer] agency is calling regarding the in home supportive services program health care certification form. States she is calling regarding question number 4. She is requesting a call back from Aracely.

## 2019-02-13 NOTE — Telephone Encounter (Signed)
2nd call  Judeth Cornfield social worker  from [employer] agency is returning call called clinic no answer per stephanie if information on question 3 is true and correct than answer for question 4 should be yes if patient disabilities will be longer than 12 consecutive months please assist thank you   Fax number [phone]

## 2019-02-15 ENCOUNTER — Encounter: Payer: Self-pay | Admitting: Family Medicine

## 2019-02-15 ENCOUNTER — Ambulatory Visit: Payer: No Typology Code available for payment source | Attending: Family Medicine | Admitting: Family Medicine

## 2019-02-15 DIAGNOSIS — F17200 Nicotine dependence, unspecified, uncomplicated: Secondary | ICD-10-CM

## 2019-02-15 DIAGNOSIS — K5901 Slow transit constipation: Secondary | ICD-10-CM | POA: Insufficient documentation

## 2019-02-15 DIAGNOSIS — I63233 Cerebral infarction due to unspecified occlusion or stenosis of bilateral carotid arteries: Secondary | ICD-10-CM | POA: Insufficient documentation

## 2019-02-15 DIAGNOSIS — G2581 Restless legs syndrome: Secondary | ICD-10-CM | POA: Insufficient documentation

## 2019-02-15 DIAGNOSIS — F251 Schizoaffective disorder, depressive type: Secondary | ICD-10-CM | POA: Insufficient documentation

## 2019-02-15 DIAGNOSIS — I6523 Occlusion and stenosis of bilateral carotid arteries: Secondary | ICD-10-CM | POA: Insufficient documentation

## 2019-02-15 DIAGNOSIS — I10 Essential (primary) hypertension: Secondary | ICD-10-CM | POA: Insufficient documentation

## 2019-02-15 DIAGNOSIS — Z0289 Encounter for other administrative examinations: Secondary | ICD-10-CM | POA: Insufficient documentation

## 2019-02-15 MED ORDER — CLOPIDOGREL BISULFATE 75 MG OR TABS
75.0000 mg | ORAL_TABLET | Freq: Every day | ORAL | 3 refills | Status: DC
Start: 2019-02-15 — End: 2019-08-22

## 2019-02-15 MED ORDER — QUETIAPINE FUMARATE 200 MG OR TABS
200.0000 mg | ORAL_TABLET | Freq: Every evening | ORAL | 0 refills | Status: DC
Start: 2019-02-15 — End: 2019-06-18

## 2019-02-15 MED ORDER — BISACODYL EC 5 MG OR TBEC
5.0000 mg | DELAYED_RELEASE_TABLET | Freq: Two times a day (BID) | ORAL | 3 refills | Status: DC | PRN
Start: 2019-02-15 — End: 2019-06-18

## 2019-02-15 MED ORDER — ASPIRIN 81 MG OR TBEC
81.0000 mg | DELAYED_RELEASE_TABLET | Freq: Every day | ORAL | 3 refills | Status: DC
Start: 2019-02-15 — End: 2019-12-20

## 2019-02-15 MED ORDER — LISINOPRIL 20 MG OR TABS
20.0000 mg | ORAL_TABLET | Freq: Every day | ORAL | 3 refills | Status: DC
Start: 2019-02-15 — End: 2019-04-11

## 2019-02-15 MED ORDER — ROPINIROLE HCL 4 MG OR TABS
4.0000 mg | ORAL_TABLET | Freq: Every day | ORAL | 3 refills | Status: DC
Start: 2019-02-15 — End: 2019-08-03

## 2019-02-15 MED ORDER — ATORVASTATIN CALCIUM 80 MG OR TABS
80.0000 mg | ORAL_TABLET | Freq: Every evening | ORAL | 3 refills | Status: DC
Start: 2019-02-15 — End: 2019-04-11

## 2019-02-15 NOTE — Progress Notes (Signed)
Attending Note:    Subjective:  I reviewed the history.  Patient interviewed   History of present illness (HPI):  Follow up     Review of Systems (ROS): As per the resident's note.  Past Medical, Family, Social History:  As per the resident's  note.    Objective:   No physical exam as this was a telephone encounter.    Assessment and plan reviewed with the resident physician.  I agree with the resident's plan as documented.    See the resident's note for further details.

## 2019-02-15 NOTE — Telephone Encounter (Signed)
Fixed the answer to Question 4. Will place in MA box to re-fax.    Pricilla Loveless, MD   [facility]   Pager: [phone]

## 2019-02-15 NOTE — Patient Instructions (Signed)
Thank you for letting [facility] take care of you. If you have any questions, don't hesitate to call our clinic at [phone]. If I am not in clinic and you want to see a doctor, please try to get an appointment with either Dr. Ureno Garcia, Dr. Giles Currie or Dr. Ariel Murtagh.    Gracias por permitendo a Family Medicine, [place], a cuidando a usted hoy. Si Usted tiene alguna pregunta o inquietud despus de su cita hoy, llame al [phone].  Si no puede tener una cita conmigo, puede tener una con: Dr. Ureno Garcia, Dr. Miangel Flom o Dr. Ariel Murtagh.

## 2019-02-15 NOTE — Telephone Encounter (Signed)
[facility] agency Form has been faxed back on 02/15/2019

## 2019-02-19 NOTE — Progress Notes (Deleted)
Due to COVID-19 pandemic and a federally declared state of public health emergency, this service is being conducted via telephone.  Patient consented to proceeding with telemedicine visit today. Total time spent was {DURATION OF ZWCH:85277}. The telemedicine visit was conducted with {MODE:26040}    Mental Health Clinic Outpatient Progress Note    ID: Regina Ward is a 58 year old year old female with diagnoses of *** who presents to clinic today for regularly scheduled follow-up appointment.    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     #Tobacco use disorder  Seen by Dr. Danella Penton on 02/15/19. On 1PPD (increase from previously 5-6 cigarettes pr day). On nicotine patches, not helping. PCP questioning regarding starting Chantix.    #Schizoaffective disorder   On quetiapine 200mg  daily. Referred to psych in the past.       *** reports no current suicidal ideation, homicidal ideation, paranoid ideation, auditory and visual hallucinations.    Social History:  Smoking: ***  Alcohol: ***  Drugs: ***    Reproduction:  Currently Pregnant? ***  Sexually active? ***  Forms of contraception using, if any: ***    Review of Systems:  Patient does not report nausea/vomiting, diarrhea/constipation, chest pain/shortness of breath, and light-headedness/headache.     Vital Signs:  LMP  (LMP Unknown)     Mental Status Examination:  Appearance: As stated age, fair grooming/hygiene, wearing appropriate clothing  Behavior: *** eye contact. Pleasant, cooperative. No psychomotor agitation or retardation.  Gait: ***  Muscle strength/tone: no atrophy or abnormal movements  Speech: spontaneous, fluent  Mood: "***"  Affect: Full and  appropriate  Thought Process: linear, goal-directed   Associations: Tight  Abnormal or psychotic thoughts:       No suicidal ideation      No homicidal ideation      No auditory hallucinations      No visual hallucinations      No paranoia      No delusions  Insight/Judgment: fair    Orientation: intact to person, location, and situation      GAD7:   [place] PHQ9 DEPRESSION QUESTIONNAIRE 04/14/2018 05/08/2018 05/08/2018 05/22/2018 08/17/2018 09/18/2018 09/18/2018   Interest 3 0 3 3 (No Data) (No Data) 3   Depressed 3 0 3 3 -- -- 3   Sleep 1 -- 3 2 -- -- 3   Energy 3 -- 3 1 -- -- 3   Appetite 1 -- 0 1 -- -- 3   Failure 1 -- 2 3 -- -- 3   Concentration 3 -- 3 3 -- -- 3   Movement 3 -- 2 0 -- -- 3   Suicide 0 -- 0 0 -- -- 2   Summary(Manual) -- -- -- -- -- -- --   Summary(Calculated) 18 -- 19 16 -- -- 26   Functional Somewhat difficult -- Very difficult Very difficult -- -- Extremely difficult   Some recent data might be hidden       PHQ-9: No flowsheet data found.      Current Outpatient Medications:  Prior to Admission medications    Medication Sig Start Date End Date Taking? Authorizing Provider   albuterol 108 (90 Base) MCG/ACT inhaler Inhale 2 puffs by mouth  every 6 hours as needed for Wheezing. 09/18/18   Pricilla Lovelessruong, Jenny, MD   aspirin 81 MG EC tablet Take 1 tablet (81 mg) by mouth daily. 02/15/19   Pricilla Lovelessruong, Jenny, MD   atorvastatin (LIPITOR) 80 MG tablet Take 1 tablet (80 mg) by mouth nightly. 02/15/19   Pricilla Lovelessruong, Jenny, MD   bisacodyl (DULCOLAX) 5 MG Enteric-Coated tablet Take 1 tablet (5 mg) by mouth 2 times daily as needed for Constipation. 02/15/19   Pricilla Lovelessruong, Jenny, MD   blood glucose meter Check once daily fasting glucose 01/09/19   Vicente MalesUreno Garcia, Edgar, MD   chlorhexidine (PERIDEX) 0.12 % solution Rinse with 1/2 ounce by mouth for 30 seconds then spit out. use twice a day for 7 days 06/30/18   Historical, Documentation   clopidogrel (PLAVIX) 75 MG tablet Take 1 tablet (75 mg) by mouth daily. 02/15/19   Pricilla Lovelessruong, Jenny, MD   glucose  blood test strip 1 strip by Other route every morning (before breakfast). 01/09/19   Vicente MalesUreno Garcia, Edgar, MD   lancets 1 each by Other route every morning (before breakfast). 01/09/19   Vicente MalesUreno Garcia, Edgar, MD   lisinopril (PRINIVIL, ZESTRIL) 20 MG tablet Take 1 tablet (20 mg) by mouth daily. 02/15/19   Pricilla Lovelessruong, Jenny, MD   metFORMIN (GLUCOPHAGE) 1000 MG tablet Take 1 tablet (1,000 mg) by mouth 2 times daily (with meals). 12/22/18 02/20/19  Pricilla Lovelessruong, Jenny, MD   nicotine (NICOTINE STEP 1) 21 MG/24HR patch Step 1: apply 1 patch on skin every 24 hours for weeks 1-6 after stop smoking 08/17/18   Albertine Patriciaran, Huy T, DO   nicotine (NICOTINE STEP 2) 14 MG/24HR Step 2: apply 1 patch on skin every 24 hours for weeks 7-8 after stop smoking 10/24/18   Pricilla Lovelessruong, Jenny, MD   nicotine (NICOTINE STEP 3) 7 MG/24HR Step 3: apply 1 patch on skin every 24 hours for weeks 9-10 after stop smoking 10/24/18   Pricilla Lovelessruong, Jenny, MD   nitroGLYcerin (NITROSTAT) 0.4 MG SL tablet 1 tablet (0.4 mg) by Sublingual route every 5 minutes as needed for Chest Pain. up to 3 tabs per episode. 09/18/18   Pricilla Lovelessruong, Jenny, MD   QUEtiapine (SEROQUEL) 100 MG tablet Take 1 tablet (100 mg) by mouth every morning. 11/21/18   Pricilla Lovelessruong, Jenny, MD   QUEtiapine (SEROQUEL) 200 MG tablet Take 1 tablet (200 mg) by mouth at bedtime. 02/15/19 04/16/19  Pricilla Lovelessruong, Jenny, MD   ropinirole (REQUIP) 4 MG tablet Take 1 tablet (4 mg) by mouth daily. 02/15/19   Pricilla Lovelessruong, Jenny, MD   spacer (AEROCHAMBER) MISC Use as directed 07/07/18   Ophelia ShoulderHo, Hai Nguyen, MD   Spacer/Aero-Holding Chambers (AEROCHAMBER PLUS FLO-VU) MISC As Directed. 07/07/18   Historical, Documentation   tiotropium (SPIRIVA RESPIMAT) 1.25 MCG/ACT AERS Inhale 2 puffs by mouth daily. 10/09/18   Pricilla Lovelessruong, Jenny, MD       Recent Labs:  {None new}    Assessment: Estrellita Ludwigvelyn Ann Eichhorst is a 58 year old year old female with ***      DSM Diagnosis: ***    Plan:  1. Patient reports no SI/HI. Continue outpatient. Patient able to verbalize plans to call 911 or go to the  nearest ED if feeling psychiatrically unstable, suicidal, or homicidal.  2. Psychiatric Medication Management:  - ***  - Discussed the risks, benefits, and treatment alternative with patient, who agrees with the above medication regimen.  3. Labs: None at this time.  4. RTC in *** or sooner as needed.  Radonna Ricker MD    Due to COVID-19 pandemic and a federally declared state of public health emergency, this service is being conducted via telephone.  Patient consented to proceeding with telemedicine visit today. Total time spent was {DURATION OF ZOXW:96045}. The telemedicine visit was conducted with {MODE:26040}    TELEPHONE [facility]   [facility] - {SA/Ana:21724}    The following medical visit occurred in the form of a telephone call visit instead of an in person face-to-face visit.     I called the patient by telephone and we discussed the following items in detail:     Patient: Rahmah Mccamy  DOB: 01/27/1961  Primary Care Provider: Radonna Ricker    Subjective:      HPI: Ashunti Schofield is a 58 year old female with {pmh} who presents with chief complaint of No chief complaint on file.    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     #Tobacco use disorder  Seen by Dr. Danella Penton on 02/15/19. On 1PPD (increase from previously 5-6 cigarettes pr day). On nicotine patches, not helping. PCP questioning regarding starting Chantix.    #Schizoaffective disorder   On quetiapine  daily. Referred to psych in the past.       ALLERGIES:  No Known Allergies    MEDICATIONS:   Current Outpatient Medications on File Prior to Visit   Medication Sig   . albuterol 108 (90 Base) MCG/ACT inhaler Inhale 2 puffs by mouth  every 6 hours as needed for Wheezing.   Marland Kitchen aspirin 81 MG EC tablet Take 1 tablet (81 mg) by mouth daily.   Marland Kitchen atorvastatin (LIPITOR) 80 MG tablet Take 1 tablet (80 mg) by mouth nightly.   . bisacodyl (DULCOLAX) 5 MG Enteric-Coated tablet Take 1 tablet (5 mg) by mouth 2 times daily as needed for Constipation.   . blood glucose meter Check once daily fasting glucose   . chlorhexidine (PERIDEX) 0.12 % solution Rinse with 1/2 ounce by mouth for 30 seconds then spit out. use twice a day for 7 days   . clopidogrel (PLAVIX) 75 MG tablet Take 1 tablet (75 mg) by mouth daily.   Marland Kitchen glucose blood test strip 1 strip by Other route every morning (before breakfast).   Marland Kitchen lancets 1 each by Other route every morning (before breakfast).   Marland Kitchen lisinopril (PRINIVIL, ZESTRIL) 20 MG tablet Take 1 tablet (20 mg) by mouth daily.   . metFORMIN (GLUCOPHAGE) 1000 MG tablet Take 1 tablet (1,000 mg) by mouth 2 times daily (with meals).   . nicotine (NICOTINE STEP 1) 21 MG/24HR patch Step 1: apply 1 patch on skin every 24 hours for weeks 1-6 after stop smoking   . nicotine (NICOTINE STEP 2) 14 MG/24HR Step 2: apply 1 patch on skin every 24 hours for weeks 7-8 after stop smoking   . nicotine (NICOTINE STEP 3) 7 MG/24HR Step 3: apply 1 patch on skin every 24 hours for weeks 9-10 after stop smoking   . nitroGLYcerin (NITROSTAT) 0.4 MG SL tablet 1 tablet (0.4 mg) by Sublingual route every 5 minutes as needed for Chest Pain. up to 3 tabs per episode.   Marland Kitchen QUEtiapine (SEROQUEL) 100 MG tablet Take 1  tablet (100 mg) by mouth every morning.   Marland Kitchen QUEtiapine (SEROQUEL) 200 MG tablet Take 1 tablet (200 mg) by mouth at bedtime.   . ropinirole (REQUIP) 4 MG tablet Take 1 tablet (4 mg) by mouth daily.   Marland Kitchen spacer (AEROCHAMBER) MISC Use as directed   . Spacer/Aero-Holding Chambers (AEROCHAMBER PLUS FLO-VU) MISC As Directed.   . tiotropium (SPIRIVA RESPIMAT) 1.25 MCG/ACT AERS Inhale 2 puffs by mouth daily.     No current facility-administered medications on file prior  to visit.        REVIEW OF SYSTEMS:  10 system ROS was performed. Pertinent positives/negatives have been indicated as above.    Objective:     PHYSICAL EXAMINATION:  No vitals signs and no physical examination done as discussion was not face-to-face     LABS/IMAGING:  ***    Assessment/Plan:    ***    Estimated body mass index is 36.29 kg/m as calculated from the following:    Height as of 01/09/19:  (1.575 m).    Weight as of 01/09/19: 90 kg (198 lb 6.6 oz).   Comments:   Goal less than 30 for patients with no co-morbid conditions, less than 25 with co-morbid conditions  - Discussed lifestyle modifications to reach goal BMI  - Follow up at next visit     #RTC: No follow-ups on file.    All the above issues were fully discussed with patient during our telephone conversation and patient is aware of all of these issues as outlined above.     Ventura Bruns, DO  [place] Department of Family Medicine Resident   [facility]    Patient discussed with attending, Dr. Marland Kitchen

## 2019-02-20 ENCOUNTER — Telehealth: Payer: Self-pay | Admitting: Social Worker

## 2019-02-20 NOTE — Telephone Encounter (Signed)
CSW left voice mail for patient requesting call back to discuss psychiatrist referral.  Patient was referred to [facility] program in 12/19 and CSW confirmed she has assigned worker Tobi Bastos [phone]).  Worker would not provide any information about [facility] psychiatry coverage when contacted by CSW.

## 2019-02-21 ENCOUNTER — Ambulatory Visit: Payer: No Typology Code available for payment source

## 2019-02-22 NOTE — Progress Notes (Signed)
This encounter was opened in error.  Please disregard.

## 2019-02-23 ENCOUNTER — Ambulatory Visit: Payer: No Typology Code available for payment source

## 2019-02-23 NOTE — Progress Notes (Signed)
This encounter was opened in error.  Please disregard.

## 2019-02-23 NOTE — Interdisciplinary (Signed)
CSW attempted to call patient twice.  She did not pick up.

## 2019-03-11 NOTE — Progress Notes (Signed)
Telemedicine Encounter Note - COVID-19 Precautions  [place]    The following medical visit occurred in the form of a telemedicine visit instead of an in person face-to-face visit.     Due to COVID-19 pandemic and a federally declared state of public health emergency, this telemedicine visit was conducted Audio Only.   Patient confirmed their presence in the state of [place] and has consented to proceeding with telemedicine today. Total time spent was 11-20 minutes.      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 This is a follow up for tobacco cessation    [ 2:18 PM ] Called home phone number, left voice message.    [ 2:19 PM ] Called other phone number [phone].     # Interval Updates:  - Now fully established with [facility] and residing at the center with help from [place]: patient states needs to call someone named "Tim", he has been prescribing her medications. She thinks he started lexapro on her (does not know dose)  - Has been smoking 20 cigarettes (increased from prior due to stressors of unknown living arrangements and COVID)    [ 2:29 PM ] Call ended.     Past Medical History:  Patient Active Problem List   Diagnosis   . Insomnia due to other mental disorder   . Bilateral carotid artery stenosis   . Stroke (cerebrum) (CMS-HCC)   . COPD with asthma (CMS-HCC)   . Schizoaffective disorder, depressive type (CMS-HCC)   . Restless leg syndrome   . Posttraumatic stress disorder   . Type 2  diabetes mellitus with diabetic polyneuropathy, without long-term current use of insulin (CMS-HCC)   . OSA (obstructive sleep apnea)   . History of uterine cancer   . Tobacco use disorder   . Morbid obesity with BMI of 40.0-44.9, adult (CMS-HCC)   . Chronic hepatitis C without hepatic coma (CMS-HCC)   . Routine health maintenance   . Essential hypertension     Past Surgical History:   Procedure Laterality Date   . THROMBOENDARTERECTOMY Right 12/2016    Carotd endarterectomy   . TOTAL ABDOMINAL HYSTERECTOMY W/ BILATERAL SALPINGOOPHORECTOMY  1993      Family History   Problem Relation Name Age of Onset   . Diabetes Mother          Leg amputation   . Stroke Father  56   . Heart Disease Brother     . Stroke Maternal Grandmother     . Heart Attack Maternal Grandmother        Current Outpatient Medications:   .  albuterol 108 (90 Base) MCG/ACT inhaler, Inhale 2 puffs by mouth every 6 hours as needed for Wheezing., Disp: 1 Inhaler, Rfl: 3  .  aspirin 81 MG EC tablet, Take 1 tablet (81 mg) by mouth daily., Disp: 90 tablet, Rfl: 3  .  atorvastatin (LIPITOR) 80 MG tablet, Take 1 tablet (80 mg) by mouth nightly., Disp: 90 tablet, Rfl: 3  .  bisacodyl (DULCOLAX) 5 MG Enteric-Coated tablet, Take 1 tablet (5 mg) by mouth 2 times daily as needed for Constipation., Disp: 60 tablet, Rfl: 3  .  blood glucose meter, Check once daily fasting glucose, Disp: 1 Device, Rfl: 0  .  chlorhexidine (PERIDEX) 0.12 % solution, Rinse with 1/2 ounce by mouth for 30 seconds then spit out. use twice a day for 7 days, Disp: , Rfl: 0  .  clopidogrel (PLAVIX) 75 MG tablet, Take 1 tablet (75 mg) by mouth daily., Disp: 90 tablet, Rfl: 3  .  glucose blood test strip, 1 strip by Other route every morning (before breakfast)., Disp: 100 strip, Rfl: 5  .  lancets, 1 each by Other route every morning (before breakfast)., Disp: 100 Lancet, Rfl: 5  .  lisinopril (PRINIVIL, ZESTRIL) 20 MG tablet, Take 1 tablet (20 mg) by mouth daily., Disp: 60 tablet, Rfl: 3  .   metFORMIN (GLUCOPHAGE) 1000 MG tablet, Take 1 tablet (1,000 mg) by mouth 2 times daily (with meals)., Disp: 180 tablet, Rfl: 3  .  nicotine (NICOTINE STEP 1) 21 MG/24HR patch, Step 1: apply 1 patch on skin every 24 hours for weeks 1-6 after stop smoking, Disp: 42 patch, Rfl: 0  .  nicotine (NICOTINE STEP 2) 14 MG/24HR, Step 2: apply 1 patch on skin every 24 hours for weeks 7-8 after stop smoking, Disp: 14 patch, Rfl: 0  .  nicotine (NICOTINE STEP 3) 7 MG/24HR, Step 3: apply 1 patch on skin every 24 hours for weeks 9-10 after stop smoking, Disp: 14 patch, Rfl: 0  .  nitroGLYcerin (NITROSTAT) 0.4 MG SL tablet, 1 tablet (0.4 mg) by Sublingual route every 5 minutes as needed for Chest Pain. up to 3 tabs per episode., Disp: 1 bottle, Rfl: 1  .  QUEtiapine (SEROQUEL) 100 MG tablet, Take 1 tablet (100 mg) by mouth every morning., Disp: 60 tablet, Rfl: 0  .  QUEtiapine (SEROQUEL) 200 MG tablet, Take 1 tablet (200 mg) by mouth at bedtime., Disp: 60 tablet, Rfl: 0  .  ropinirole (REQUIP) 4 MG tablet, Take 1 tablet (4 mg) by mouth daily., Disp: 90 tablet, Rfl: 3  .  spacer (AEROCHAMBER) MISC, Use as directed, Disp: 1 each, Rfl: 3  .  Spacer/Aero-Holding Chambers (AEROCHAMBER PLUS FLO-VU) MISC, As Directed., Disp: , Rfl: 0  .  tiotropium (SPIRIVA RESPIMAT) 1.25 MCG/ACT AERS, Inhale 2 puffs by mouth daily., Disp: 4 g, Rfl: 3    Allergies:  No Known Allergies    SOCIAL HISTORY:      reports that she has been smoking cigarettes. She has a 61.50 pack-year smoking history. She has never used smokeless tobacco. She reports that she does not drink alcohol or use drugs.    Review of Systems:  A 10 system ROS was performed and is negative except as above in HPI    Physical Examination:  No vitals signs and no physical examination done as discussion was not face-to-face     Labs/Imaging: none new    Assessment/Plan:    # Schizoaffective disorder, depressive type (CMS-HCC)  # Posttraumatic stress disorder  Appreciate assistance from SW -  [employer]ney[employer]isa Camino. [facility] Also established care with a Psychiatrist now who assists with her psychiatric medications.    # Tobacco use disorder  Currently smoking about 20 cigarettes per day (increased from prior 5-6 cigarettes per day). Patient failed trial of nicotine patches, gum and lozenges. Offered trial of  chantix but patient is very concerned regarding the side effect profile of vivid dreams/nightmares. She would like to trial bupropion instead.  - Will start bupropion 150mg  daily x3 days and increase to 150 mg BID onwards  Orders  - buPROPion (ZYBAN) 150 MG SR tablet; Take 1 tablet (150 mg) by mouth daily for 3 days, THEN 1 tablet (150 mg) 2 times daily for 27 days.  Dispense: 117 tablet; Refill: 0    # Type 2 diabetes mellitus with diabetic polyneuropathy, without long-term current use of insulin (CMS-HCC)  Last A1c 6.4 (07/10/18). Goal A1c <7. Last LDL 51 (04/07/18).  - Continue metformin 1000mg  BID  - Microvascular screening:    [x]  Eye exam: 10/30/18    [x]  Foot exam: 07/07/18 normal monofilament    [x]  Urine microalbumin/creatinine ratio: 4.5 (04/07/18)  - Will recheck labs and follow up in 1 month  Orders  - Glycosylated Hgb(A1C), Blood Lavender; Future  - Lipid Panel Green Plasma Separator Tube; Future  - Random Urine Microalb/Creat Ratio Panel; Future  - Comprehensive Metabolic Panel - See Instructions; Future    # Chronic hepatitis C without hepatic coma (CMS-HCC)  Followed by outside GI - Dr. Daivd CouncilBui. Asymptomatic. Compensated. Longstanding history of untreated HCV infection. Unclear source. Significant work up includes:    - HepC viral load (05/08/18) >2 million     - HepC genotype (05/23/18) 1a    - HepA IgG (05/23/18) immune    - HepB Surface Ab (05/23/18) NR, s/p Hep B #2. Due for Hep B #3 01/06/19    - AFP (05/23/18) 2.6 normal range    - RUQ US (08/29/18): no focal hepatic abnormality, benign splenic calcification  - Will recheck AFP  Orders  - AFP, Serum,  Tumor Marker -[facility]      All the above issues were fully discussed with patient during our telephone conversation and patient is aware of all of these issues as outlined above.     Patient discussed with attending, Dr. Vear ClockKilgore    Follow Up: Return in about 1 month (around 04/11/2019) for follow up HTN, tobacco use disorder FACE TO FACE (telemed okay if unable).

## 2019-03-12 ENCOUNTER — Ambulatory Visit: Payer: No Typology Code available for payment source | Attending: Family Medicine | Admitting: Family Medicine

## 2019-03-12 ENCOUNTER — Encounter: Payer: Self-pay | Admitting: Family Medicine

## 2019-03-12 DIAGNOSIS — F17200 Nicotine dependence, unspecified, uncomplicated: Secondary | ICD-10-CM

## 2019-03-12 DIAGNOSIS — E1142 Type 2 diabetes mellitus with diabetic polyneuropathy: Secondary | ICD-10-CM | POA: Insufficient documentation

## 2019-03-12 DIAGNOSIS — F4310 Post-traumatic stress disorder, unspecified: Secondary | ICD-10-CM | POA: Insufficient documentation

## 2019-03-12 DIAGNOSIS — B182 Chronic viral hepatitis C: Secondary | ICD-10-CM | POA: Insufficient documentation

## 2019-03-12 DIAGNOSIS — F251 Schizoaffective disorder, depressive type: Secondary | ICD-10-CM | POA: Insufficient documentation

## 2019-03-12 MED ORDER — BUPROPION HCL (SMOKING DETER) 150 MG OR TB12
ORAL_TABLET | ORAL | 0 refills | Status: AC
Start: 2019-03-12 — End: 2019-04-11

## 2019-03-12 NOTE — Progress Notes (Signed)
Attending Attestation:  The supervising physician and/or patient is not physically present with the resident and the supervising physician is concurrently monitoring the patient care through appropriate telecommunication technology.  I reviewed the key and critical portions of the history as presented by the resident Dr. Hulen Luster and agree with the medical decision making and the assessment and plan as documented. My additions or revision are included in the record.    Briefly, this is a 58 year old female patient with a past hx of: PTSD, schizoaffective, anxiety. COPD, hx carotid EA, hx prior CVA with residual L sided weakness, HCV, DM. SW has been working on helping with housing - pt needs IADL support due to L hemiparesis, also poor health and literacy, has now found housing site via organization called [facility], has established psychiatry care there as well. However, now smoking more up to 20 cig/day. Pt hesitant re: chantix, has not been helped by a variety of tobacco replacement options in past .  The telehealth visit discussion included the following assessment and plan: con't with psychiatry at her new housing complex, smoking cessation - trial bupropion with appropriate f/u with psych and to monitor BP, also time to recheck A1c (previously DM well controlled on Metforming with A1c 6.4 last fall).    Trudee Kuster, MD

## 2019-03-12 NOTE — Patient Instructions (Signed)
Thank you for letting [facility] take care of you. If you have any questions, don't hesitate to call our clinic at [phone]. If I am not in clinic and you want to see a doctor, please try to get an appointment with either Dr. Ureno Garcia, Dr. Cadyn Fann or Dr. Ariel Murtagh.    Gracias por permitendo a Family Medicine, [place], a cuidando a usted hoy. Si Usted tiene alguna pregunta o inquietud despus de su cita hoy, llame al [phone].  Si no puede tener una cita conmigo, puede tener una con: Dr. Ureno Garcia, Dr. Macon Sandiford o Dr. Ariel Murtagh.

## 2019-03-19 ENCOUNTER — Other Ambulatory Visit
Admission: RE | Admit: 2019-03-19 | Discharge: 2019-03-19 | Disposition: A | Discharge status: Home / Self Care | Payer: No Typology Code available for payment source | Attending: Family Medicine | Admitting: Family Medicine

## 2019-03-19 DIAGNOSIS — E1142 Type 2 diabetes mellitus with diabetic polyneuropathy: Secondary | ICD-10-CM

## 2019-03-19 LAB — COMPREHENSIVE METABOLIC PANEL, BLOOD
ALT: 34 U/L (ref 7–52)
AST: 23 U/L (ref 13–39)
Albumin: 3.8 G/DL (ref 3.7–5.3)
Alk Phos: 49 U/L (ref 34–104)
BUN: 12 mg/dL (ref 7–25)
Bilirubin, Total: 0.4 mg/dL (ref 0.0–1.4)
CO2: 27 mmol/L (ref 21–31)
Calcium: 8.9 mg/dL (ref 8.6–10.3)
Chloride: 108 mmol/L — ABNORMAL HIGH (ref 98–107)
Creat: 1.1 mg/dL (ref 0.6–1.2)
Electrolyte Balance: 4 mmol/L (ref 2–12)
Glucose: 99 mg/dL (ref 85–125)
Potassium: 4.3 mmol/L (ref 3.5–5.1)
Protein, Total: 6.6 G/DL (ref 6.0–8.3)
Sodium: 139 mmol/L (ref 136–145)
eGFR - high estimate: >60 (ref >59)
eGFR - low estimate: 51 — ABNORMAL LOW (ref >59)

## 2019-03-19 LAB — ALPHA FETOPROTEIN, BLOOD: AFP: 2.1 NG/ML (ref <9.0)

## 2019-03-19 LAB — RANDOM URINE MICROALB/CREAT RATIO PANEL
Creatinine, G/L: 2.5 g/L
Microalbumin/Creatinine Ratio: 6.8 MG MICAL/GM CRE
Microalbumin: 17 MG/L

## 2019-03-19 LAB — LIPID(CHOL FRACT) PANEL, BLOOD
Cholesterol: 178 MG/DL (ref <200)
HDL Cholesterol: 51 MG/DL (ref >40)
LDL Cholesterol (calc): 104 MG/DL (ref <160)
Non HDL Cholesterol (calculated): 127 MG/DL (ref <130)
Triglycerides: 117 MG/DL (ref <150)
VLDL Cholesterol (calculated): 23 MG/DL

## 2019-03-19 LAB — GLYCOSYLATED HGB(A1C), BLOOD: Glycated Hgb, A1C: 6.1 % — ABNORMAL HIGH (ref 4.6–5.6)

## 2019-03-21 LAB — QUANTIFERON-TB, BLOOD
QuantiFERON Mitogen: >10 IU/mL
QuantiFERON NIL: 0.14 IU/mL
QuantiFERON TB: NEGATIVE
Quantiferon Plus TB1 minus NIL: 0.01 IU/mL (ref 0.00–0.34)
Quantiferon Plus TB2 minus NIL: 0 IU/mL (ref 0.00–0.34)

## 2019-04-05 ENCOUNTER — Ambulatory Visit: Payer: Self-pay | Admitting: Family Medicine

## 2019-04-05 NOTE — Telephone Encounter (Signed)
Patient is calling because stroke in left arm patient has tingling feeling and would like nurse advice. Called nurse line, left voicemail. Please follow up. Thank you

## 2019-04-05 NOTE — Telephone Encounter (Signed)
Called patient. No answer. Receive message stating [employer] customer not available. Unable to leave message. If patient calls back she needs to go to ED for evaluation.  RCornejo, RN

## 2019-04-10 NOTE — Progress Notes (Signed)
[facility]    [name], MD  [facility] Department of Family Medicine Resident     Patient: Regina Ward  DOB: [birth year]  Primary Care Provider: Irean Hong  Date of Service:  04/11/2019     SUBJECTIVE:     Shanese Riemenschneider is a 58 year old Non-Hispanic female with mental illness (PTSD, schizoaffective - depressive type, anxiety), COPD, bilateral carotid artery stenosis s/p R thromboendarterectomy (12/2016), CVA (2018) c/b left sided residual weakness, insomnia, T2DM c/b CKD3, chronic HepC who presents today for follow up HTN and tobacco use disorder Follow Up (chest pain 3 days)    # Left arm tingling and numbness  - 2 weeks ago, was just resting and sitting in chair when she suddenly developed left arm numbness and tingling. States it reminds her of when she had a stroke in 2018  - States right side of eye started to droop as well  - Denies confusion, slurred speech, syncope    # Chest Pain  - Left sided chest pain started 2 weeks ago as well  - Intermittent, especially at night  - Quick sharp short episodes occurring 3 times per day  - Occassionaly related to exertion but usually occurs when resting at night  - She ran out of nitroglycerin and couldn't trial it to see if it would help relieve pain  - Denies palpitations, shortness of breath    # Social  - Stable living situation. Lives with 18 women now through the [facility]  - Patient states she has been cooking and cleaning with new friends  - Her overall mood is significantly improved compared to previous visits. Patient states in general, she is in a better place and hasn't felt this happy in a long time  - Being followed by a Psychiatrist via [facility]. Started on lexapro and thinks this medication has contributed to mood    # Tobacco Use Disorder  - Currently smoking 11 cigarettes compared to 20 cigarettes per day from 03/12/19 visit  - Patient reports she hasn't started taking bupropion.     [facility] PHQ2  09/18/2018 05/22/2018 05/08/2018 05/08/2018 04/14/2018 02/01/2018 10/25/2017   PHQ2 Total _0 0 6 0 6   Some recent data might be hidden     Past Medical History:   Diagnosis Date   . Anxiety 2003   . Asthma     Diagnosed as a child   . Bipolar 1 disorder (CMS-HCC) 2003   . 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 Depression 2003   . Restless leg syndrome 2003   . Schizophrenia (CMS-HCC) 07/04/2017   . Stroke (cerebrum) (CMS-HCC) 11/20/2016      Past Surgical History:   Procedure Laterality Date   . THROMBOENDARTERECTOMY Right 12/2016    Carotd endarterectomy   . TOTAL ABDOMINAL HYSTERECTOMY W/ BILATERAL SALPINGOOPHORECTOMY  1993      Family History   Problem Relation Name Age of Onset   . Diabetes Mother          Leg amputation   . Stroke Father  23   . Heart Disease Brother     . Stroke Maternal Grandmother     . Heart Attack Maternal Grandmother        Current Outpatient Medications:   .  albuterol 108 (90 Base) MCG/ACT inhaler, Inhale 2 puffs by mouth every 6 hours as  needed for Wheezing., Disp: 1 Inhaler, Rfl: 3  .  aspirin 81 MG EC tablet, Take 1 tablet (81 mg) by mouth daily., Disp: 90 tablet, Rfl: 3  .  atorvastatin (LIPITOR) 80 MG tablet, Take 1 tablet (80 mg) by mouth nightly., Disp: 90 tablet, Rfl: 3  .  bisacodyl (DULCOLAX) 5 MG Enteric-Coated tablet, Take 1 tablet (5 mg) by mouth 2 times daily as needed for Constipation., Disp: 60 tablet, Rfl: 3  .  buPROPion (WELLBUTRIN SR) 150 MG SR tablet, , Disp: , Rfl:   .  buPROPion (ZYBAN) 150 MG SR tablet, Take 1 tablet (150 mg) by mouth daily for 3 days, THEN 1 tablet (150 mg) 2 times daily for 27 days., Disp: 117 tablet, Rfl: 0  .  clopidogrel (PLAVIX) 75 MG tablet, Take 1 tablet (75 mg) by mouth daily., Disp: 90 tablet, Rfl: 3  .  escitalopram (LEXAPRO) 10 MG tablet, Take 10 mg by mouth every morning., Disp: , Rfl:   .  gabapentin (NEURONTIN) 100 MG capsule, gabapentin 100 mg capsule, Disp: , Rfl:   .  glecaprevir-pibrentasvir (MAVYRET) 100-40  MG tablet, Mavyret 100 mg-40 mg tablet, Disp: , Rfl:   .  lisinopril (PRINIVIL, ZESTRIL) 30 MG tablet, Take 1 tablet (30 mg) by mouth daily., Disp: 90 tablet, Rfl: 3  .  metFORMIN (GLUCOPHAGE) 1000 MG tablet, Take 1 tablet (1,000 mg) by mouth 2 times daily (with meals)., Disp: 180 tablet, Rfl: 3  .  mirtazapine (REMERON) 15 MG tablet, mirtazapine 15 mg tablet, Disp: , Rfl:   .  nicotine (NICODERM CQ) 14 MG/24HR, nicotine 14 mg/24 hr daily transdermal patch, Disp: , Rfl:   .  nitroGLYcerin (NITROSTAT) 0.4 MG SL tablet, 1 tablet (0.4 mg) by Sublingual route every 5 minutes as needed for Chest Pain. up to 3 tabs per episode., Disp: 1 bottle, Rfl: 1  .  PEG 3350 with electrolytes (GAVILYTE-G) 236 g solution, GaviLyte-G 236 gram-22.74 gram-6.74 gram-5.86 gram oral solution, Disp: , Rfl:   .  QUEtiapine (SEROQUEL) 100 MG tablet, Take 1 tablet (100 mg) by mouth every morning., Disp: 60 tablet, Rfl: 0  .  QUEtiapine (SEROQUEL) 200 MG tablet, Take 1 tablet (200 mg) by mouth at bedtime., Disp: 60 tablet, Rfl: 0  .  ropinirole (REQUIP) 4 MG tablet, Take 1 tablet (4 mg) by mouth daily., Disp: 90 tablet, Rfl: 3  .  tiotropium (SPIRIVA RESPIMAT) 1.25 MCG/ACT AERS, Inhale 2 puffs by mouth daily., Disp: 4 g, Rfl: 3    Allergies:  No Known Allergies    SOCIAL HISTORY:      reports that she has been smoking cigarettes. She has a 61.50 pack-year smoking history. She has never used smokeless tobacco. She reports that she does not drink alcohol or use drugs.    Review of Systems:  A 10 system ROS was performed and is negative except as above in HPI    OBJECTIVE:     Temperature:  [96.9 F (36.1 C)] 96.9 F (36.1 C) (07/08 1325)  Blood pressure (BP): (139-149)/(70-73) 139/70 (07/08 1327)  Heart Rate:  [61-68] 61 (07/08 1327)  Respirations:  [16] 16 (07/08 1325)  Pain Score: 8 (07/08 1327)     Physical Exam:  Well developed, in NAD. Conversant, good eye contact.  Heart: regular rate and rhythm. S1 and S2 intensities normal. No murmurs,  rubs or gallops.   Chest: clear to auscultation bilaterally. No wheezes, rales or rhonchi  Abdomen: soft, nontender, nondistended. No rebound, no guarding.  Extremities: no peripheral edema   Diabetic Foot Exam: no areas of erythema, ulceration, or fissuring; pedal pulses 2+, normal cap refill, intact to monofilament testing  Neuro:   - CN: II-XII intact. No facial droop or slurring of speech. PERRL, EOMI, no nystagmus; facial sensation intact bilaterally; face symmetric; shoulder shrug/sternocleidomastoid muscle 5/5; tongue midline.  - Muscle: normal tone, normal bulk; new pronator drift in left arm, 4-/5 strength in LUE (unchanged from baseline); 5/5 strength in RUE and bilateral lower extremities  - Sensory: diminished light touch and pinpoint in left upper extremity, normal in rest  - Coordination: finger to nose, rapid alternating hand movements and heel-to-shin intact though much slower on left upper extremity  - Gait: normal; tandem gait/toe/heel-walking intact         Labs and Imaging:    Lab Results   Component Value Date    SODIUM 139 03/19/2019    K 4.3 03/19/2019    CL 108 (H) 03/19/2019    CO2 27 03/19/2019    BUN 12 03/19/2019    CREAT 1.1 03/19/2019    GFR 51 (L) 03/19/2019    GLU 99 03/19/2019     Lab Results   Component Value Date    TPROT 6.6 03/19/2019    ALB 3.8 03/19/2019    ALK 49 03/19/2019    ALT 34 03/19/2019    AST 23 03/19/2019    TBILI 0.4 03/19/2019     Lab Results   Component Value Date    CHOL 178 03/19/2019    TRIG 117 03/19/2019    HDLCH2 51 03/19/2019    LDLCALC 104 03/19/2019     Lab Results   Component Value Date    A1C 6.1 (H) 03/19/2019     ECG 04/11/19 normal sinus rhythm (similar to 2018)    ASSESSMENT/PLAN:     # Numbness and tingling in left arm  Patient with hx of CVA 2018. Patient has had 2 weeks of new numbness and tingling in left arm. She states this feels like her previous stroke. No other new neurological changes (facial droop, slurred speech) aside from new pronator  drift. Continues to have left arm weakness. Concerning for new stroke.  - Will order STAT MRI Brain  Orders  - MRI Brain WO/W Contrast; Future    # Other chest pain  Suspect cardiac-related. She has hx of chronic chest pain but current episode in the past 2 weeks is worse than usual, painful and more frequent. Patient was referred to cardiology 09/2018 but was unable to follow up due to extenuating circumstances at home (was evicted and needed to find housing). Suspect patient may have underlying CAD but last Lexiscan/ECHO was >5 years outside of [place]. No acute changes on ECG today.  - Refill nitroglycerin  - STAT referral to cardiology  - STAT Lexiscan  Orders  - Consult/Referral to Cardiology Clinic  - Nuc Med Myocard Lexiscan Stress Test; Future  - Lexiscan Nuclear Stress Test; Future  - regadenoson (LEXISCAN) injection 0.4 mg  - sodium chloride 0.9 % flush 10 mL  - sodium chloride 0.9 % flush 5 mL  - nitroGLYcerin (NITROSTAT) 0.4 MG SL tablet; 1 tablet (0.4 mg) by Sublingual route every 5 minutes as needed for Chest Pain. up to 3 tabs per episode.  Dispense: 1 bottle; Refill: 1    # Essential hypertension  Patient unable to check blood pressures at home due to left arm weakness s/p CVA.  - Increase lisinopril to '30mg'$  daily  today. BP in clinic was 149/73 initially and 139/70 on repeat  Orders  - lisinopril (PRINIVIL, ZESTRIL) 30 MG tablet; Take 1 tablet (30 mg) by mouth daily.  Dispense: 90 tablet; Refill: 3    # Blurred vision, left eye  Patient endorses for the past couple years, she started noticing a dark shade/color/dimming sensation in her vision. Denies   Orders  - Consult/Referral to Ophthalmology Clinic    # Dystrophic nail  See Phys Exam for picture. Given hyperkeratotic bilateral big toe nails and hx of DM, will send referral to podiatry.  - Consult/Referral to [facility]    # Bilateral carotid artery stenosis  # Cerebrovascular accident (CVA) due to bilateral stenosis of  carotid arteries (CMS-HCC)  History of bilateral carotid artery stenosis 12/2017 leading to stroke, s/p R thromboendarterectomy. Followed by vascular surgery. Scheduled for CT Neck scan on 04/16/19. Per patient, there was increase in "blockage" and vascular surgery will provide more information s/p results. May possibly need thromboendarterectomy in left side this time.  Orders  - atorvastatin (LIPITOR) 80 MG tablet; Take 1 tablet (80 mg) by mouth nightly.  Dispense: 90 tablet; Refill: 3    # Need for vaccination  - Hepatitis B (Recombivax, Engerix-B) #3    Patient discussed with attending, Dr. Wandra Feinstein    Follow Up: Return in about 1 month (around 05/12/2019) for in clinic, with me, follow up results.

## 2019-04-11 ENCOUNTER — Ambulatory Visit: Payer: No Typology Code available for payment source | Attending: Family Practice | Admitting: Family Medicine

## 2019-04-11 ENCOUNTER — Encounter: Payer: Self-pay | Admitting: Family Medicine

## 2019-04-11 VITALS — BP 139/70 | HR 61 | Temp 96.9°F | Resp 16 | Ht 62.0 in | Wt 177.9 lb

## 2019-04-11 DIAGNOSIS — L603 Nail dystrophy: Secondary | ICD-10-CM | POA: Insufficient documentation

## 2019-04-11 DIAGNOSIS — I6523 Occlusion and stenosis of bilateral carotid arteries: Secondary | ICD-10-CM | POA: Insufficient documentation

## 2019-04-11 DIAGNOSIS — H538 Other visual disturbances: Secondary | ICD-10-CM | POA: Insufficient documentation

## 2019-04-11 DIAGNOSIS — I10 Essential (primary) hypertension: Secondary | ICD-10-CM

## 2019-04-11 DIAGNOSIS — R202 Paresthesia of skin: Secondary | ICD-10-CM | POA: Insufficient documentation

## 2019-04-11 DIAGNOSIS — R0789 Other chest pain: Secondary | ICD-10-CM | POA: Insufficient documentation

## 2019-04-11 DIAGNOSIS — I63233 Cerebral infarction due to unspecified occlusion or stenosis of bilateral carotid arteries: Secondary | ICD-10-CM

## 2019-04-11 DIAGNOSIS — R200 Anesthesia of skin: Secondary | ICD-10-CM | POA: Insufficient documentation

## 2019-04-11 DIAGNOSIS — Z6832 Body mass index (BMI) 32.0-32.9, adult: Secondary | ICD-10-CM | POA: Insufficient documentation

## 2019-04-11 DIAGNOSIS — Z23 Encounter for immunization: Secondary | ICD-10-CM | POA: Insufficient documentation

## 2019-04-11 MED ORDER — LISINOPRIL 30 MG OR TABS
30.0000 mg | ORAL_TABLET | Freq: Every day | ORAL | 3 refills | Status: DC
Start: 2019-04-11 — End: 2019-04-16

## 2019-04-11 MED ORDER — GABAPENTIN 100 MG OR CAPS
ORAL_CAPSULE | ORAL | Status: DC
Start: ? — End: 2019-07-23

## 2019-04-11 MED ORDER — PEG 3350-KCL-NABCB-NACL-NASULF 236 GM OR SOLR
ORAL | Status: DC
Start: ? — End: 2019-06-18

## 2019-04-11 MED ORDER — NITROGLYCERIN 0.4 MG SL SUBL
0.4000 mg | SUBLINGUAL_TABLET | SUBLINGUAL | 1 refills | Status: DC | PRN
Start: 2019-04-11 — End: 2019-04-11

## 2019-04-11 MED ORDER — LISINOPRIL 30 MG OR TABS
30.0000 mg | ORAL_TABLET | Freq: Every day | ORAL | 3 refills | Status: DC
Start: 2019-04-11 — End: 2019-04-11

## 2019-04-11 MED ORDER — BUPROPION SR (BID) 150 MG OR TB12
ORAL_TABLET | ORAL | Status: DC
Start: 2019-04-05 — End: 2019-07-23

## 2019-04-11 MED ORDER — MIRTAZAPINE 15 MG OR TABS
ORAL_TABLET | ORAL | Status: DC
Start: ? — End: 2019-06-18

## 2019-04-11 MED ORDER — NICOTINE 14 MG/24HR TD PT24
MEDICATED_PATCH | TRANSDERMAL | Status: DC
Start: ? — End: 2020-01-18

## 2019-04-11 MED ORDER — GLECAPREVIR-PIBRENTASVIR 100-40 MG PO TABS
ORAL_TABLET | ORAL | Status: DC
Start: ? — End: 2019-07-23

## 2019-04-11 MED ORDER — SODIUM CHLORIDE FLUSH 0.9 % IV SOLN
5.0000 mL | Freq: Once | INTRAVENOUS | Status: DC
Start: 2019-04-11 — End: 2019-07-23

## 2019-04-11 MED ORDER — SODIUM CHLORIDE FLUSH 0.9 % IV SOLN
10.0000 mL | Freq: Once | INTRAVENOUS | Status: DC
Start: 2019-04-11 — End: 2019-07-23

## 2019-04-11 MED ORDER — REGADENOSON 0.4 MG/5ML IV SOLN
0.4000 mg | Freq: Once | INTRAVENOUS | Status: DC
Start: 2019-04-11 — End: 2019-07-23

## 2019-04-11 MED ORDER — ATORVASTATIN CALCIUM 80 MG OR TABS
80.0000 mg | ORAL_TABLET | Freq: Every evening | ORAL | 3 refills | Status: DC
Start: 2019-04-11 — End: 2019-04-11

## 2019-04-11 MED ORDER — ATORVASTATIN CALCIUM 80 MG OR TABS
80.0000 mg | ORAL_TABLET | Freq: Every evening | ORAL | 3 refills | Status: DC
Start: 2019-04-11 — End: 2019-06-18

## 2019-04-11 MED ORDER — NITROGLYCERIN 0.4 MG SL SUBL
0.4000 mg | SUBLINGUAL_TABLET | SUBLINGUAL | 1 refills | Status: DC | PRN
Start: 2019-04-11 — End: 2020-01-18

## 2019-04-11 MED ORDER — ESCITALOPRAM OXALATE 10 MG OR TABS
10.0000 mg | ORAL_TABLET | Freq: Every morning | ORAL | Status: DC
Start: 2019-02-22 — End: 2019-06-18

## 2019-04-11 NOTE — Patient Instructions (Addendum)
Please complete the following:  - MRI Brain  - Cardiology referral. You may need a stress test  - I have refilled the lipitor, nitroglycerin, and increased lisinopril to 30mg  daily  - You have been referred to Ophthalmology and Podiatry as well  - Start taking the wellbutrin (bupropion) to help with smoking cessation. Follow directions on bottle    You have been referred to a specialist.  Note that referrals can take about 1-2 weeks to be authorized.  If you do not receive a letter or call regarding your referral please call [phone] for information. If you have questions regarding your appointments with specialists, please call [phone] to confirm or request appointment.    Please return to clinic if not better or call with any concerns or questions.  If you have any questions that cannot wait until the next day during business hours, please call [phone]

## 2019-04-11 NOTE — Progress Notes (Signed)
Attending Attestation:  I reviewed the key and critical portions of the history and physical as presented by the resident and agree with the medical decision making and the assessment and plan as documented. My additions or revision are included in the record.    Leon Montoya E Makell Drohan, MD

## 2019-04-11 NOTE — Progress Notes (Signed)
Reviewed with patient on 04/11/19 appt

## 2019-04-13 ENCOUNTER — Telehealth: Payer: Self-pay | Admitting: Family Medicine

## 2019-04-13 ENCOUNTER — Ambulatory Visit: Payer: Self-pay | Admitting: Family Medicine

## 2019-04-13 DIAGNOSIS — E1142 Type 2 diabetes mellitus with diabetic polyneuropathy: Secondary | ICD-10-CM

## 2019-04-13 DIAGNOSIS — I10 Essential (primary) hypertension: Secondary | ICD-10-CM

## 2019-04-13 NOTE — Telephone Encounter (Signed)
Patient states her blood pressure is high 170 please assist thank you

## 2019-04-13 NOTE — Telephone Encounter (Addendum)
Patient is requesting a new glucose meter kit with test strips needles and alcohol pads also is requesting refill for blood pressure medication patient unable to provide medication name please assist thank you

## 2019-04-16 MED ORDER — LISINOPRIL 30 MG OR TABS
30.0000 mg | ORAL_TABLET | Freq: Every day | ORAL | 3 refills | Status: DC
Start: 2019-04-16 — End: 2019-05-01

## 2019-04-16 MED ORDER — LANCETS MISC
1.0000 | Freq: Every evening | 5 refills | Status: DC
Start: 2019-04-16 — End: 2019-05-01

## 2019-04-16 MED ORDER — BLOOD GLUCOSE METER KIT
PACK | 0 refills | Status: DC
Start: 2019-04-16 — End: 2019-05-01

## 2019-04-16 MED ORDER — GLUCOSE BLOOD VI STRP
1.0000 | ORAL_STRIP | Freq: Every evening | 5 refills | Status: DC
Start: 2019-04-16 — End: 2019-05-01

## 2019-04-16 NOTE — Telephone Encounter (Addendum)
Patient's blood pressure medication - lisinopril 30mg  daily was already ordered on 04/11/19. Will reorder again. Will also place order for glucometer with testing supplies.    [name], MD   [place]   Pager: [phone]

## 2019-04-16 NOTE — Telephone Encounter (Signed)
Called patient. No answer. Left message with call back number.  RCornejo, RN

## 2019-04-16 NOTE — Telephone Encounter (Signed)
Forward message to  Dr. Truong J. For review

## 2019-04-18 LAB — ECG IN CLINIC
P AXIS: 1 Deg
PR INTERVAL: 136 ms
QRS INTERVAL/DURATION: 84 ms
QT: 420 ms
QTc (Bazett): 425 ms
R AXIS: 63 Deg
R-R INTERVAL AVERAGE: 963 ms
T AXIS: 59 Deg
VENTRICULAR RATE: 62 {beats}/min

## 2019-04-19 ENCOUNTER — Telehealth: Payer: Self-pay | Admitting: Family Medicine

## 2019-04-19 NOTE — Telephone Encounter (Signed)
Melissa from [facility] [place], ph [phone] fax [phone] is requesting the order for   Brain MRI with and without contrast faxed over asap because patient has an appointment for tomorrow.  Thank you.

## 2019-04-19 NOTE — Telephone Encounter (Signed)
Order faxed to [facility]  Fax confirmation received

## 2019-04-24 ENCOUNTER — Telehealth: Payer: Self-pay

## 2019-04-25 ENCOUNTER — Ambulatory Visit: Admit: 2019-04-25 | Payer: No Typology Code available for payment source

## 2019-04-25 ENCOUNTER — Ambulatory Visit: Payer: No Typology Code available for payment source

## 2019-04-25 ENCOUNTER — Telehealth: Payer: Self-pay | Admitting: Family Medicine

## 2019-04-25 NOTE — Telephone Encounter (Signed)
MESSAGE FORWARD TO DR. TRUONG J

## 2019-04-25 NOTE — Telephone Encounter (Signed)
Pt informed of referral

## 2019-04-25 NOTE — Telephone Encounter (Signed)
Cardiology referral was already placed on 04/11/19. She needs to see a cardiologist.    Please let me know how I can further assist    [name], MD   [facility]   Pager: [phone]

## 2019-04-25 NOTE — Telephone Encounter (Signed)
PATIENT IS CALLING STATED THAT SHE NEEDS REFERRAL FOR CARDIO VASCULAR. PLEASE PUT IN THIS REFERRAL AS URGENT. PLEASE TAKE CARE OF THIS. PLEASE TAKE CARE OF THIS. Regina Ward

## 2019-04-30 NOTE — Progress Notes (Signed)
[facility]    [name], MD  [place] Department of Family Medicine Resident     Patient: Regina Ward  DOB: 1961/06/27  Primary Care Provider: Irean Hong  Date of Service:  05/01/2019     SUBJECTIVE:     Regina Ward is a 58 year old Non-Hispanic female with mental illness (PTSD, schizoaffective - depressive type, anxiety), COPD, b/l carotid artery stenosis s/p R thromboendarterectomy (12/2016), CVA (2018) c/b left sided residual weakness, insomnia, T2DM c/b CKD3, chronic HepC who presents today for Follow Up    # Left arm tingling and numbness  - Onset mid June 2020, was just resting and sitting in chair when she suddenly developed left arm numbness and tingling. States it reminds her of when she had a stroke in 2018  - Concerned that right eye is drooping more than last visit  - MRI Brain is scheduled some time next week    # Cardiovascular  - Chest pain is now infrequent (aproximately once per day or every other day, lasting ~1-2 seconds). Patient states that the nitroglycerin is helping. Does report mild shortness of breath when pain occurs.  - ECHO was completed yesterday 04/30/19  - Tomorrow will be lexiscan 05/02/19  - Patient to [facility] [place] for appointment    # Bilateral ear pain  - x2 weeks, described as fullness like ear popping at high altitude  - Denies trauma, foreign body, ringing, hearing loss, discharge    # HTN  - Patient was unable to start the lisinopril '30mg'$  daily. Was told that provider never sent it. Unclear why it didn't go through.    [facility] PHQ2 09/18/2018 05/22/2018 05/08/2018 05/08/2018 04/14/2018 02/01/2018 10/25/2017   PHQ2 Total '6 6 6 '$ 0 6 0 6   Some recent data might be hidden     Past Medical History:   Diagnosis Date   . Anxiety 2003   . Asthma     Diagnosed as a child   . Bipolar 1 disorder (CMS-HCC) 2003   . 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 Depression 2003   . Restless leg  syndrome 2003   . Schizophrenia (CMS-HCC) 07/04/2017   . Stroke (cerebrum) (CMS-HCC) 11/20/2016     Past Surgical History:   Procedure Laterality Date   . THROMBOENDARTERECTOMY Right 12/2016    Carotd endarterectomy   . TOTAL ABDOMINAL HYSTERECTOMY W/ BILATERAL SALPINGOOPHORECTOMY  1993      Family History   Problem Relation Name Age of Onset   . Diabetes Mother          Leg amputation   . Stroke Father  11   . Heart Disease Brother     . Stroke Maternal Grandmother     . Heart Attack Maternal Grandmother        Current Outpatient Medications:   .  albuterol 108 (90 Base) MCG/ACT inhaler, Inhale 2 puffs by mouth every 6 hours as needed for Wheezing., Disp: 1 Inhaler, Rfl: 3  .  aspirin 81 MG EC tablet, Take 1 tablet (81 mg) by mouth daily., Disp: 90 tablet, Rfl: 3  .  atorvastatin (LIPITOR) 80 MG tablet, Take 1 tablet (80 mg) by mouth nightly., Disp: 90 tablet, Rfl: 3  .  bisacodyl (DULCOLAX) 5 MG Enteric-Coated tablet, Take 1 tablet (5 mg) by mouth 2 times daily as needed for Constipation., Disp:  60 tablet, Rfl: 3  .  buPROPion (WELLBUTRIN SR) 150 MG SR tablet, , Disp: , Rfl:   .  chlorhexidine (PERIDEX) 0.12 % solution, Rinse with 1/2 ounce by mouth for 30 seconds then spit out. use twice a day for 7 days, Disp: , Rfl: 0  .  clopidogrel (PLAVIX) 75 MG tablet, Take 1 tablet (75 mg) by mouth daily., Disp: 90 tablet, Rfl: 3  .  escitalopram (LEXAPRO) 10 MG tablet, Take 10 mg by mouth every morning., Disp: , Rfl:   .  gabapentin (NEURONTIN) 100 MG capsule, gabapentin 100 mg capsule, Disp: , Rfl:   .  glecaprevir-pibrentasvir (MAVYRET) 100-40 MG tablet, Mavyret 100 mg-40 mg tablet, Disp: , Rfl:   .  lisinopril (PRINIVIL, ZESTRIL) 30 MG tablet, Take 1 tablet (30 mg) by mouth daily., Disp: 90 tablet, Rfl: 3  .  metFORMIN (GLUCOPHAGE) 1000 MG tablet, Take 1 tablet (1,000 mg) by mouth 2 times daily (with meals)., Disp: 180 tablet, Rfl: 3  .  mirtazapine (REMERON) 15 MG tablet, mirtazapine 15 mg tablet, Disp: , Rfl:   .   nicotine (NICODERM CQ) 14 MG/24HR, nicotine 14 mg/24 hr daily transdermal patch, Disp: , Rfl:   .  nitroGLYcerin (NITROSTAT) 0.4 MG SL tablet, 1 tablet (0.4 mg) by Sublingual route every 5 minutes as needed for Chest Pain. up to 3 tabs per episode., Disp: 1 bottle, Rfl: 1  .  PEG 3350 with electrolytes (GAVILYTE-G) 236 g solution, GaviLyte-G 236 gram-22.74 gram-6.74 gram-5.86 gram oral solution, Disp: , Rfl:   .  QUEtiapine (SEROQUEL) 100 MG tablet, Take 1 tablet (100 mg) by mouth every morning., Disp: 60 tablet, Rfl: 0  .  QUEtiapine (SEROQUEL) 200 MG tablet, Take 1 tablet (200 mg) by mouth at bedtime., Disp: 60 tablet, Rfl: 0  .  ropinirole (REQUIP) 4 MG tablet, Take 1 tablet (4 mg) by mouth daily., Disp: 90 tablet, Rfl: 3  .  tiotropium (SPIRIVA RESPIMAT) 1.25 MCG/ACT AERS, Inhale 2 puffs by mouth daily., Disp: 4 g, Rfl: 3    Allergies:  No Known Allergies    SOCIAL HISTORY:      reports that she has been smoking cigarettes. She has a 61.50 pack-year smoking history. She has never used smokeless tobacco. She reports that she does not drink alcohol or use drugs.    Review of Systems:  A 10 system ROS was performed and is negative except as above in HPI    OBJECTIVE:     Temperature:  [97.6 F (36.4 C)] 97.6 F (36.4 C) (07/28 1508)  Blood pressure (BP): (156-168)/(90-93) 156/90 (07/28 1515)  Heart Rate:  [60-67] 60 (07/28 1515)  Respirations:  [18] 18 (07/28 1508)  Pain Score: 0 (07/28 1511)     Physical Exam:  Well developed, in NAD. Conversant, good eye contact. Patient well dressed today and in better spirits.  Heart: regular rate and rhythm. S1 and S2 intensities normal. No murmurs, rubs or gallops.   Chest: clear to auscultation bilaterally. No wheezes, rales or rhonchi  Abdomen: soft, nontender, nondistended. No rebound, no guarding.   Extremities: no peripheral edema   Neuro:   - CN: II-XII intact. No facial droop or slurring of speech. PERRL, EOMI, no nystagmus; facial sensation intact bilaterally; face  symmetric; shoulder shrug/sternocleidomastoid muscle 5/5; tongue midline.  - Muscle: normal tone, normal bulk; no pronator drift, 4-/5 strength in LUE (unchanged from baseline); 5/5 strength in RUE and bilateral lower extremities  - Sensory: diminished light touch and pinpoint  in left upper extremity, normal in rest  - Coordination: finger to nose normal on right (unable to perform on left), rapid alternating hand movements normal in right (unable to perform on left)    Labs and Imaging: none new    ASSESSMENT/PLAN:     # Numbness and tingling in left arm  Hx of CVA 2018 c/b left sided residual weakness. Patient with new numbness and tingling in left arm starting mid June 2020. States feels lie prior stroke. No other new neurological changes (facial droop, slurred speech). Neuro exam unchanged from baseline today. There is concern that patient had another stroke.  - Has MRI Brain scheduled next week  - Will refer patient to Neurology to establish care  Orders  - Consult/Referral to Neurology    # Other chest pain  Suspect cardiac-related and underlying CAD. She has hx of chronic chest pain and it has improved since last visit with rx of nitroglycerin. Patient was referred to cardiology 09/2018 but was unable to follow up due to extenuating circumstances at home (was evicted and needed to find housing). Suspect patient may have underlying CAD but last Lexiscan/ECHO was >5 years outside of [place]. No acute changes on ECG performed 04/11/19.  - Patient is scheduled for lexiscan tomorrow  - Advised patient to call [facility] to schedule appointment with cardiologist    # Dysfunction of both eustachian tubes  # Bilateral ear pain  Discussed risks/benefits/alternatives. Minimal evidence of flonase to help with eustachian tube dysfunction but patient wants to try it.  Orders  - fluticasone propionate (FLONASE) 50 MCG/ACT nasal spray; Spray 1 spray into each nostril daily.  Dispense: 1 bottle; Refill:  0    # Essential hypertension  Patient unable to check blood pressures at home due to left arm weakness s/p CVA.  - Lisinopril was increased to '30mg'$  daily given high blood pressure in clinic on 04/11/19. Patient never received prescription. Otherwise, patient reported chronic hx of cough for >1 year now. Will discontinue lisinopril and start losartan '75mg'$  daily  Orders  - losartan (COZAAR) 50 MG tablet; Take 1.5 tablets (75 mg) by mouth daily.  Dispense: 135 tablet; Refill: 3    # Type 2 diabetes mellitus with diabetic polyneuropathy, without long-term current use of insulin (CMS-HCC)  - Blood Glucose Monitoring Suppl (TRUE METRIX METER) w/Device KIT; Check blood sugar daily as directed.  Dispense: 1 kit; Refill: 0  - glucose blood test strip; 1 strip by Other route at bedtime.  Dispense: 100 strip; Refill: 5  - lancets; 1 each by Other route at bedtime.  Dispense: 100 Lancet; Refill: 5     Patient discussed with attending, Dr. Alla German    Follow Up: Return in about 1 month (around 06/01/2019) for in clinic, follow up results.

## 2019-05-01 ENCOUNTER — Other Ambulatory Visit: Payer: Commercial Managed Care - Pharmacy Benefit Manager

## 2019-05-01 ENCOUNTER — Encounter: Payer: Self-pay | Admitting: Family Medicine

## 2019-05-01 ENCOUNTER — Ambulatory Visit: Payer: No Typology Code available for payment source | Attending: Family Medicine | Admitting: Family Medicine

## 2019-05-01 VITALS — BP 156/90 | HR 60 | Temp 97.6°F | Resp 18 | Ht 62.0 in | Wt 175.3 lb

## 2019-05-01 DIAGNOSIS — I10 Essential (primary) hypertension: Secondary | ICD-10-CM | POA: Insufficient documentation

## 2019-05-01 DIAGNOSIS — H6983 Other specified disorders of Eustachian tube, bilateral: Secondary | ICD-10-CM | POA: Insufficient documentation

## 2019-05-01 DIAGNOSIS — R200 Anesthesia of skin: Secondary | ICD-10-CM | POA: Insufficient documentation

## 2019-05-01 DIAGNOSIS — E1142 Type 2 diabetes mellitus with diabetic polyneuropathy: Secondary | ICD-10-CM

## 2019-05-01 DIAGNOSIS — H9203 Otalgia, bilateral: Secondary | ICD-10-CM | POA: Insufficient documentation

## 2019-05-01 DIAGNOSIS — R202 Paresthesia of skin: Secondary | ICD-10-CM | POA: Insufficient documentation

## 2019-05-01 DIAGNOSIS — R0789 Other chest pain: Secondary | ICD-10-CM | POA: Insufficient documentation

## 2019-05-01 DIAGNOSIS — Z6832 Body mass index (BMI) 32.0-32.9, adult: Secondary | ICD-10-CM | POA: Insufficient documentation

## 2019-05-01 MED ORDER — TRUE METRIX METER W/DEVICE KIT
PACK | 0 refills | Status: AC
Start: 2019-05-01 — End: ?
  Filled 2019-05-01: qty 1, 30d supply, fill #0

## 2019-05-01 MED ORDER — GLUCOSE BLOOD VI STRP
1.0000 | ORAL_STRIP | Freq: Every evening | 5 refills | Status: DC
Start: 2019-05-01 — End: 2020-02-27
  Filled 2019-05-01: qty 50, 50d supply, fill #0

## 2019-05-01 MED ORDER — FLUTICASONE PROPIONATE 50 MCG/ACT NA SUSP
1.0000 | Freq: Every day | NASAL | 0 refills | Status: DC
Start: 2019-05-01 — End: 2019-08-22

## 2019-05-01 MED ORDER — LOSARTAN POTASSIUM 50 MG OR TABS
75.0000 mg | ORAL_TABLET | Freq: Every day | ORAL | 3 refills | Status: DC
Start: 2019-05-01 — End: 2019-06-18

## 2019-05-01 MED ORDER — LANCETS MISC
1.0000 | Freq: Every evening | 5 refills | Status: DC
Start: 2019-05-01 — End: 2020-02-27
  Filled 2019-05-01: qty 100, 90d supply, fill #0

## 2019-05-01 NOTE — Patient Instructions (Addendum)
-   Stop taking lisinopril. Start taking losartan 75mg  (1.5 tablets) everyday  Places that you can get COVID testing:  [facility] test sites in [place], [place], [place] and [place]. Appointments can be made by calling [phone] or visiting [email or web address]. Please note that phone registration will only be used for people without internet access.  It is at no cost to you    You can also go to the [place] located at multiple [facility] pharmacies.

## 2019-05-02 ENCOUNTER — Telehealth: Payer: Self-pay | Admitting: Family Medicine

## 2019-05-02 NOTE — Telephone Encounter (Signed)
Message forward to admin pool

## 2019-05-02 NOTE — Telephone Encounter (Signed)
Regina Ward from [facility] requesting refills of the following medications:     clopidogrel (PLAVIX) 75 MG tablet     aspirin 81 MG EC tablet    Multi Vitamin    Please send to:   [facility] [address], [place], [place] [address] Ph# [phone] fx# [phone]

## 2019-05-02 NOTE — Telephone Encounter (Signed)
[employer] and spoke to Rennert and inform her that on 05/14 Dr. Hulen Luster send Rx for clopidogrel (PLAVIX) 75 MG tablet and aspirin 81 MG EC tablet with refills to [facility] in [place], per [employer] from [place] she will call [facility] and request a transfer

## 2019-05-02 NOTE — Telephone Encounter (Signed)
Pt is calling trying to make a follow up appt for 1 month there are no appts available.

## 2019-05-03 NOTE — Telephone Encounter (Signed)
Unable to lvm letter sent

## 2019-05-04 NOTE — Progress Notes (Signed)
Attending Note:    58 year old female with hx CVA, DM Type 2, HTN, here with left arm symptoms as above, chest pain, and bilateral ear pain.  O: VS and PE above  A/P  Hx CVA, ?new CVA -  Plan as above  Suspect CAD, scheduled Lexiscan  HTN  DM Type 2  Bilateral ear pain  Assessments and plans as above.  Tyron Russell, M.D.

## 2019-05-08 NOTE — Telephone Encounter (Signed)
Please see previous message from 81. I explain to patient per Verdis Frederickson send her a letter because she could not get of hold of her but she still insist to speak with office. Please assist. Thank you.

## 2019-05-08 NOTE — Telephone Encounter (Signed)
Called pt to clarified message n/a unable to LVM, pt's voice mail is full

## 2019-05-11 ENCOUNTER — Telehealth: Payer: Self-pay | Admitting: Family Medicine

## 2019-05-11 NOTE — Telephone Encounter (Signed)
Pt wants a sooner appt than next available in October wants appt for next month she states for  Follow up appt.

## 2019-05-14 ENCOUNTER — Telehealth: Payer: Self-pay | Admitting: Family Medicine

## 2019-05-14 NOTE — Telephone Encounter (Signed)
FYI Dr. Aurelio Brash I requested records from hospital and I will placed them in your inbox once they are received.

## 2019-05-14 NOTE — Telephone Encounter (Signed)
Patient wanted to inform her provider if she can please contact [facility] because she had emergency surgery last night. Please follow up, thank you.

## 2019-05-14 NOTE — Telephone Encounter (Signed)
I called pt and she states she's at the hospital at this moment

## 2019-05-14 NOTE — Telephone Encounter (Signed)
Appreciate excellent care from support staff.    Unfortunately I will be on wards for the next 3 weeks.     If there are any urgent matters, please route to [facility].    [name], MD   [place]

## 2019-05-16 NOTE — Telephone Encounter (Signed)
PT AGREED WITH NEW APT IN SEPT WITH DR Hulen Luster

## 2019-05-16 NOTE — Telephone Encounter (Addendum)
2nd call  Patient is following up on previous message called clinic no answer left voicemail on I phone patient is schedule [phone] with dr Hulen Luster however is requesting sooner appointment decline to schedule telemedicine or with a different doctor please assist thank you

## 2019-05-16 NOTE — Telephone Encounter (Signed)
Message forward to admin pool for possible sooner appt

## 2019-06-07 ENCOUNTER — Telehealth: Payer: Self-pay | Admitting: Family Medicine

## 2019-06-07 NOTE — Telephone Encounter (Signed)
Notes faxed today  Fax confirmation received

## 2019-06-07 NOTE — Telephone Encounter (Signed)
camy from [place] requesting most recent labs faxed to [phone] thank you

## 2019-06-16 NOTE — Progress Notes (Addendum)
[facility]    [name], MD  [facility] Department of Family Medicine Resident     Patient: Regina Ward  DOB: 08-16-1961  Primary Care Provider: Raelyn Mora  Date of Service:  06/18/2019     SUBJECTIVE:     Starlena Beil is a 58 year old Non-Hispanic female with mental illness (PTSD, schizoaffective - depressive type, anxiety), COPD, b/l carotid artery stenosis s/p R thromboendarterectomy (12/2016), CVA (2018) c/b left sided residual weakness, insomnia, T2DM c/b CKD3, chronic HepC who presents today for Hospital F/U (HERNIA)    # Hospital Follow Up  Recently hospitalized at [facility] [phone] - 05/15/19) for emergent/urgent ventral hernia repair. Patient states she had abdominal mass that slowly getting bigger over a week. Since discharge, despite hernia repair, patient is still having nonbloody nonbilious emesis about once every 2 days. Has had about 10 lb weight loss in the past 2 months.     # Migraines  - Chronicity: started beginning of this year and lasts the entire day  - Pulsatile quality: described as pounding  - Location: all over  - Nausea/Vomiting: occurs once every 2 days as described above  - Disabling: yes  - Associated with photophobia & phonobia. Also having diarrhea about once per week.  - Has tried ibuprofen, aleve, and tylenol every 4 hours without improvement    # Tobacco Use Disorder  - Previous 1.5 PPD, now 5 cigarettes per day    # Bilateral carotid artery stenosis  - Had follow up with vascular surgeons last month  - Patient wants to know the results of imaging (per patient, had [place] and CT done)  - Patient will follow up    # Lexiscan  - Was done last month  - Has follow up with Cardiology on 06/26/19    [facility] PHQ2 09/18/2018 05/22/2018 05/08/2018 05/08/2018 04/14/2018 02/01/2018 10/25/2017   PHQ2 Total 6 6 6  0 6 0 6   Some recent data might be hidden     Past Medical History:   Diagnosis Date   . Anxiety 2003   . Asthma     Diagnosed as a child   . Bipolar 1 disorder (CMS-HCC) 2003   . 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 Depression 2003   . Restless leg syndrome 2003   . Schizophrenia (CMS-HCC) 07/04/2017   . Stroke (cerebrum) (CMS-HCC) 11/20/2016      Past Surgical History:   Procedure Laterality Date   . THROMBOENDARTERECTOMY Right 12/2016    Carotd endarterectomy   . TOTAL ABDOMINAL HYSTERECTOMY W/ BILATERAL SALPINGOOPHORECTOMY  1993      Family History   Problem Relation Name Age of Onset   . Diabetes Mother          Leg amputation   . Stroke Father  45   . Heart Disease Brother     . Stroke Maternal Grandmother     . Heart Attack Maternal Grandmother        Current Outpatient Medications:   .  albuterol 108 (90 Base) MCG/ACT inhaler, Inhale 2 puffs by mouth every 6 hours as needed for Wheezing., Disp: 1 Inhaler, Rfl: 3  .  aspirin 81 MG EC tablet, Take 1 tablet (81 mg) by mouth daily., Disp: 90 tablet, Rfl: 3  .  atorvastatin (LIPITOR) 80 MG tablet, Take 1 tablet (80 mg) by mouth nightly.,  Disp: 90 tablet, Rfl: 3  .  bisacodyl (DULCOLAX) 5 MG Enteric-Coated tablet, Take 1 tablet (5 mg) by mouth 2 times daily as needed for Constipation., Disp: 60 tablet, Rfl: 3  .  buPROPion (WELLBUTRIN SR) 150 MG SR tablet, , Disp: , Rfl:   .  clopidogrel (PLAVIX) 75 MG tablet, Take 1 tablet (75 mg) by mouth daily., Disp: 90 tablet, Rfl: 3  .  escitalopram (LEXAPRO) 10 MG tablet, Take 10 mg by mouth every morning., Disp: , Rfl:   .  fluticasone propionate (FLONASE) 50 MCG/ACT nasal spray, Spray 1 spray into each nostril daily., Disp: 1 bottle, Rfl: 0  .  gabapentin (NEURONTIN) 100 MG capsule, gabapentin 100 mg capsule, Disp: , Rfl:   .  glecaprevir-pibrentasvir (MAVYRET) 100-40 MG tablet, Mavyret 100 mg-40 mg tablet, Disp: , Rfl:   .  losartan (COZAAR) 50 MG tablet, Take 1.5 tablets (75 mg) by mouth daily., Disp: 135 tablet, Rfl: 3  .  metFORMIN (GLUCOPHAGE) 1000 MG tablet, Take 1 tablet (1,000 mg) by mouth 2 times  daily (with meals)., Disp: 180 tablet, Rfl: 3  .  mirtazapine (REMERON) 15 MG tablet, mirtazapine 15 mg tablet, Disp: , Rfl:   .  nicotine (NICODERM CQ) 14 MG/24HR, nicotine 14 mg/24 hr daily transdermal patch, Disp: , Rfl:   .  nitroGLYcerin (NITROSTAT) 0.4 MG SL tablet, 1 tablet (0.4 mg) by Sublingual route every 5 minutes as needed for Chest Pain. up to 3 tabs per episode., Disp: 1 bottle, Rfl: 1  .  QUEtiapine (SEROQUEL) 200 MG tablet, Take 1 tablet (200 mg) by mouth at bedtime., Disp: 60 tablet, Rfl: 0  .  ropinirole (REQUIP) 4 MG tablet, Take 1 tablet (4 mg) by mouth daily., Disp: 90 tablet, Rfl: 3  .  tiotropium (SPIRIVA RESPIMAT) 1.25 MCG/ACT AERS, Inhale 2 puffs by mouth daily., Disp: 4 g, Rfl: 3    Allergies:  No Known Allergies    SOCIAL HISTORY:      reports that she has been smoking cigarettes. She has a 61.50 pack-year smoking history. She has never used smokeless tobacco. She reports that she does not drink alcohol or use drugs.    Review of Systems:  A 10 system ROS was performed and is negative except as above in HPI    OBJECTIVE:     Temperature:  [98.8 F (37.1 C)] 98.8 F (37.1 C) (09/14 1018)  Blood pressure (BP): (128)/(72) 128/72 (09/14 1018)  Heart Rate:  [71] 71 (09/14 1018)  Respirations:  [12] 12 (09/14 1018)  SpO2:  [100 %] 100 % (09/14 1018)     Physical Exam:  Well developed, in NAD. Conversant, good eye contact. Patient well dressed today and continues to be in better spirits compared to when she first established care with me  Heart: regular rate and rhythm. S1 and S2 intensities normal. No murmurs, rubs or gallops.   Chest: clear to auscultation bilaterally. No wheezes, rales or rhonchi  Abdomen: soft, nontender, nondistended. No rebound, no guarding. Linear 3 cm umbilical surgical scar and horizontal 3 cm surgical scar ~2 cm superior to umbilical surgical scar. No recurrence of hernia.  Extremities: no peripheral edema  Neuro: no facial droop, at baseline for motor strength (4-/5  strength in LUE unchanged from baseline and 5/5 strength in RUE and bilateral lower extremities)    Labs and Imaging:  MRI Brain 05/18/19  Impression:  1. No evidence of acute intracranial hemorrhage, midline shift or mass effect. No evidence of  acute infarct.  2. Multiple old infarcts with associated areas of encephalomalacia. Diffuse white matter disease is also noted which is nonspecific and may be due to chronic microvascular ischemia.  3. No enhancing mass lesions identified.    ASSESSMENT/PLAN:     # Hospital discharge follow-up  Recently hospitalized at [facility] (~8/9 - 05/15/19) for emergent/urgen ventral hernia repair. No records available  - Appreciate MA assistance for release of records    # Intractable migraine without aura and without status migrainosus  # Non-intractable vomiting with nausea, unspecified vomiting type  Has been ongoing since beginning of this year and lasts the entire day. Worse in the past couple weeks in relation to ventral hernia repair with the nausea and vomiting. Given that ventral hernia is repaired and nausea/vomiting persists and occurs with the headaches, suspect that it is related to migraines. No acute changes on recent MRI Brain 05/18/19.  - Given cardiac history, patient has limited treatment options.  - Gave information regarding natural dietary changes and supplements that patient can trial (I.e. decreasing caffeine intake, trial riboflavin/magnesium)  - Patient concerned she has COVID though I have low suspicion. She states sx onset is possibly 05/18/19 but is not sure. Will order COVID-19 test as she is a high risk patient. [facility] likely performed COVID test prior to surgery but patient states they did not test her.  - Gave patient information to Neurology referral. Patient states she will schedule appointment and contact us if she is having difficulty  Orders  - Droplet PLUS Eye Protection Precautions  - Contact Precautions  -  Coronavirus Disease 2019 (COVID-19) SCOV Nasal-Pharyngeal; Future    # Cerebrovascular accident (CVA) due to bilateral stenosis of carotid arteries (CMS-HCC)  # Bilateral carotid artery stenosis s/p R thromboendarterectomy (12/2016)  History of bilateral carotid artery stenosis 12/2017 leading to stroke, s/p R thromboendarterectomy. Followed by vascular surgery. Scheduled for CT Neck scan on 04/16/19. Per patient, there was increase in "blockage" and vascular surgery will provide more information s/p results. May possibly need thromboendarterectomy in left side this time.  - Patient would like to know CT Neck scan results. No records available here - MA to assist with release of records. States she had an US of bilateral carotids as well  - Patient to follow up with Vascular Surgery. Patient states she is in process of arranging appointment.  Orders  - atorvastatin (LIPITOR) 80 MG tablet; Take 1 tablet (80 mg) by mouth nightly.  Dispense: 90 tablet; Refill: 3    # Chronic chest pain - currently stable and not in exacerbation  Suspect cardiac-related and underlying CAD. She has hx of chronic chest pain and it has improved since last visit with PRN nitroglycerin (has not required any since last month). Patient recently had lexiscan completed 1-2 months ago, has follow up with Cardiology [facility] 06/26/19 to discuss results. She states they were mentioning that they may do "intervention".   - Will sign release of records request for lexiscan results    # Essential hypertension  - losartan (COZAAR) 50 MG tablet; Take 1.5 tablets (75 mg) by mouth daily.  Dispense: 135 tablet; Refill: 3    # Schizoaffective disorder, depressive type (CMS-HCC)  Patient followed by Psychiatrist at her group living facility [facility] for women with mental health issues. Advised that she should be getting refills and medication monitoring by her psychiatrist. Will provide refill for now.  Orders  - escitalopram (LEXAPRO) 10  MG  tablet; Take 1 tablet (10 mg) by mouth every morning.  Dispense: 90 tablet; Refill: 0  - QUEtiapine (SEROQUEL) 200 MG tablet; Take 1 tablet (200 mg) by mouth at bedtime.  Dispense: 90 tablet; Refill: 0    # Tobacco Use Disorder  Previous medication trials include nicotine patches, gum and lozenges. Declined chantix given concerns of vivid dreams/nightmares. Started trial of bupropion approximately ~04/2019.  - Smoking has decreased from 1.5 PPD to 5 cigarettes per day  - Continue nicotine patch and bupropion 150mg  daily. Patient states taking bupropion daily is easier for her than BID     # Slow transit constipation  - bisacodyl (DULCOLAX) 5 MG Enteric-Coated tablet; Take 1 tablet (5 mg) by mouth 2 times daily as needed for Constipation.  Dispense: 60 tablet; Refill: 3     # Influenza vaccination declined - patient would like to readdress at future visit      Patient discussed with attending, Dr. Gerhard Munch    Follow Up: Return in about 1 month (around 07/18/2019) for in clinic, follow up with me.

## 2019-06-18 ENCOUNTER — Encounter: Payer: Self-pay | Admitting: Family Medicine

## 2019-06-18 ENCOUNTER — Ambulatory Visit: Payer: No Typology Code available for payment source | Attending: Family Practice | Admitting: Family Medicine

## 2019-06-18 VITALS — BP 128/72 | HR 71 | Temp 98.8°F | Resp 12 | Ht 62.0 in | Wt 167.5 lb

## 2019-06-18 DIAGNOSIS — R112 Nausea with vomiting, unspecified: Secondary | ICD-10-CM | POA: Insufficient documentation

## 2019-06-18 DIAGNOSIS — Z09 Encounter for follow-up examination after completed treatment for conditions other than malignant neoplasm: Secondary | ICD-10-CM | POA: Insufficient documentation

## 2019-06-18 DIAGNOSIS — Z6830 Body mass index (BMI) 30.0-30.9, adult: Secondary | ICD-10-CM | POA: Insufficient documentation

## 2019-06-18 DIAGNOSIS — K5901 Slow transit constipation: Secondary | ICD-10-CM | POA: Insufficient documentation

## 2019-06-18 DIAGNOSIS — F251 Schizoaffective disorder, depressive type: Secondary | ICD-10-CM | POA: Insufficient documentation

## 2019-06-18 DIAGNOSIS — F17200 Nicotine dependence, unspecified, uncomplicated: Secondary | ICD-10-CM | POA: Insufficient documentation

## 2019-06-18 DIAGNOSIS — G43019 Migraine without aura, intractable, without status migrainosus: Secondary | ICD-10-CM | POA: Insufficient documentation

## 2019-06-18 DIAGNOSIS — R0789 Other chest pain: Secondary | ICD-10-CM | POA: Insufficient documentation

## 2019-06-18 DIAGNOSIS — Z2821 Immunization not carried out because of patient refusal: Secondary | ICD-10-CM | POA: Insufficient documentation

## 2019-06-18 DIAGNOSIS — I63233 Cerebral infarction due to unspecified occlusion or stenosis of bilateral carotid arteries: Secondary | ICD-10-CM | POA: Insufficient documentation

## 2019-06-18 DIAGNOSIS — I10 Essential (primary) hypertension: Secondary | ICD-10-CM | POA: Insufficient documentation

## 2019-06-18 DIAGNOSIS — I6523 Occlusion and stenosis of bilateral carotid arteries: Secondary | ICD-10-CM | POA: Insufficient documentation

## 2019-06-18 MED ORDER — ESCITALOPRAM OXALATE 10 MG OR TABS
10.0000 mg | ORAL_TABLET | Freq: Every morning | ORAL | 1 refills | Status: DC
Start: 2019-06-18 — End: 2019-06-18

## 2019-06-18 MED ORDER — QUETIAPINE FUMARATE 200 MG OR TABS
200.0000 mg | ORAL_TABLET | Freq: Every evening | ORAL | 0 refills | Status: DC
Start: 2019-06-18 — End: 2019-09-03

## 2019-06-18 MED ORDER — QUETIAPINE FUMARATE 200 MG OR TABS
200.0000 mg | ORAL_TABLET | Freq: Every evening | ORAL | 0 refills | Status: DC
Start: 2019-06-18 — End: 2019-06-18

## 2019-06-18 MED ORDER — BISACODYL EC 5 MG OR TBEC
5.0000 mg | DELAYED_RELEASE_TABLET | Freq: Two times a day (BID) | ORAL | 3 refills | Status: DC | PRN
Start: 2019-06-18 — End: 2019-08-22

## 2019-06-18 MED ORDER — LOSARTAN POTASSIUM 50 MG OR TABS
75.0000 mg | ORAL_TABLET | Freq: Every day | ORAL | 3 refills | Status: DC
Start: 2019-06-18 — End: 2019-06-18

## 2019-06-18 MED ORDER — LOSARTAN POTASSIUM 50 MG OR TABS
75.0000 mg | ORAL_TABLET | Freq: Every day | ORAL | 3 refills | Status: DC
Start: 2019-06-18 — End: 2019-07-23

## 2019-06-18 MED ORDER — ATORVASTATIN CALCIUM 80 MG OR TABS
80.0000 mg | ORAL_TABLET | Freq: Every evening | ORAL | 3 refills | Status: DC
Start: 2019-06-18 — End: 2019-06-18

## 2019-06-18 MED ORDER — ESCITALOPRAM OXALATE 10 MG OR TABS
10.0000 mg | ORAL_TABLET | Freq: Every morning | ORAL | 0 refills | Status: DC
Start: 2019-06-18 — End: 2019-09-24

## 2019-06-18 MED ORDER — ATORVASTATIN CALCIUM 80 MG OR TABS
80.0000 mg | ORAL_TABLET | Freq: Every evening | ORAL | 3 refills | Status: DC
Start: 2019-06-18 — End: 2020-01-18

## 2019-06-18 NOTE — Progress Notes (Signed)
ATTENDING ATTESTATION:  I evaluated the patient concurrently with the Resident.  I discussed the case with the Resident and agree with the findings and plan as documented by the Resident.  Any additions or revisions are included in the record as necessary.    Ramonita Koenig, MD

## 2019-06-18 NOTE — Patient Instructions (Addendum)
Specialty: Neurology Referred to Provider/Facility: [facility] [place] Name:N/A Phone: [phone] City: [place] Zip: [phone] Fax: [phone]     Thank you for letting [facility] take care of you. If you have any questions, don't hesitate to call our clinic at [phone]. If I am not in clinic and you want to see a doctor, please try to get an appointment with either Dr. [name], Dr. Nira Retort, Dr. Kela Millin, or Dr. Michae Kava.        Migraine Diet    Food may play a significant role in the frequency of your dizziness. Although some migraine patients find that eating certain foods may trigger a dizziness or headache every single time, the effect of diet may be less obvious. In general, the more "trigger" foods you consume, the more dizziness or headaches you may have. The hope is that by avoiding these possible triggers, the better off you will be. Eating regularly timed meals, avoiding hunger, avoiding dehydration, and avoiding skipping meals is probably more important than the specific foods you do or do not eat. Try following this list as strictly as possible for at least two months. If it helps, you may gradually add back your favorite foods one at a time, keeping track of your dizziness as you do so.      Category Foods to Avoid, Reduce, or Limit Foods that are OK   Caffeine No more than 2 servings per day. Do not vary the amount or timing from day to day. Coffee, tea, colas, Mountain Dew, Sunkist, certain medications (Anacin, Excedrin) Decaffeinated coffee, herbal or green tea, [place] free sodas, fruit juice (see below)   Snacks Chocolate, nuts (peanuts, especially), seeds Fruits listed below, sherbet, ice cream, cakes pudding, Jello, sugar, jam, jelly, honey, hard cookies made without chocolate or nuts   Alcohol Avoid all, especially: ales, [place], chianti, malted beers, red wine, sherry, [place]. Note: some medications contain alcohol (Nyquil) Non-alcoholic  beverages   Dairy Certain cheeses (aged or fermented): Brie;blue, boursault, brick, Camembert, cheddar, [place], gouda, mozzarella, [place], [place], [place], [place], stilton, Swiss  Buttermilk, chocolate milk, sour cream Eggs Other cheeses: American, cottage, cream Cheese, farmer, Educational psychologist, [place].      Milk, yogurt Egg substitute   Cereals &  Grains      Meats Fresh breads and yeast products, fresh bagels, fresh doughnuts, yeast extracts, brewer's yeast, sourdough      Aged, canned, cured, or processed meats (bologna, pepperoni, salami, other pre-packaged deli meats), pickled meats or fish, salted or dried meats or poultry, hot dogs, sausages, ierky Commercial breads (white, wheat, rye, mult [place]), English Muffins, crackers, rye, toast potatoes, rice, spaghetti, noodles, hot or dried oatmeal  Fresh/ unprocessed meats, poultry, fish, veal, lamb, tuna   MSG  (Monosodium  Glutamate) Avoid in all its multiple forms:  Soy sauce, foods containing "hydrolyzed protein products" or "autolyzed yeast", canned soups, bouillon cubes, Accent, meat tenderizers, seasoned salts.  Pickled, preserved or marinated foods Salt and other spices, butter, margarine, coo white vinegar, salad dressing (small amount   Sweetener Aspartame (Equal, Nutrasweet) (somewhat controversial) Sucrose (sugar), high fructose com syrup, Splenda, saccharin (Sweet 'n Low)   Vegetables Pole or broad beans, lima beans, Italian beans, lentils, snow peas, fava beans, Navy beans, pinto beans, pea pods, sauerkraut, garbanzo beans, onions, olives, pickles Asparagus, beets, broccoli, carrots, com, pumpkins, spinach, squash, string beans, Lettuce, tomatoes, all those not listed   Fruit Avocados, figs, papaya, passion fruit, raisins, red  plums. Limit bananas and citrus fruit ([place], lemon, lime, grapefruit, tangerines) Apples, berries, peaches, pears, prunes,  fruil   Mixed Dishes Beef stroganoff, cheese blintzes, frozen meals, TV dinners, lasagna,  macaroni and cheese, pizza        Vitamins  and Dietary Supplements    Certain vitamins and food supplements may provide a benefit in terms of dizziness or headache prevention. Many unsubstantiated claims can be found on the internet and at health food stores. The best evidence exists for the agents below. Side effects are typically mild.    1. Vitamin B2/Riboflavin - up to 400mg  / day - Start at 200mg  twice a day.  2. Magnesium oxide- up to 400mg  2x / day (diarrhea possible). If you get diarrhea, cut it in half.  3. Coenzyme  Q-10 - up to 100mg  3x / day (expensive)  4. Butterburr (Petasftes hybridus) extract, Petadolex brand (pyrrolizidine alkaloid free), 50-75mg  twice a day with food (expensive)  5. Feverfew (Parthenium integrifolium) 50mg + per day (inexpensive)  6. Melatonin -There is some weaker evidence that melatonin, a hormone that helps regulate sleep, may help headaches if 3-6 mg is taken an hour or so before bedtime. Significant side effects are rare. Probably most useful in treating cluster headaches.    For many patients who want to try a supplement first, we recommend Vitamin B2 (Riboflavin) at 200 mg twice a day (has to be purchased separately, there is usually not enough in a multivitamin), and Magnesium oxide 400 mg twice a day (reduce to 200 twice a day if diarrhea develops). If you have sleep problems we recommend Melatonin 3 mg at night.

## 2019-06-19 ENCOUNTER — Telehealth: Payer: Self-pay | Admitting: Family Medicine

## 2019-06-19 NOTE — Telephone Encounter (Signed)
Per patient states vascular surgeon is requesting  CT scan of the left side of the neck please assist thank you   Dr Erline Levine  Vascular surgeon phone number [phone]

## 2019-06-21 ENCOUNTER — Ambulatory Visit: Payer: No Typology Code available for payment source | Admitting: Family Medicine

## 2019-06-25 NOTE — Telephone Encounter (Signed)
I called Dr. Milas Gain office and they stated that they placed the order for CT scan of left neck. They are requesting that the patient get it done.    Will route to MA pool. Please call patient to get the CT scan done. Please have patient call Vascular Surgeon office for the order.    [name], MD   [place]   Pager: [phone]

## 2019-06-26 NOTE — Telephone Encounter (Signed)
Called and spoke with pt and per pt she has appt for CT on 09/24

## 2019-07-11 ENCOUNTER — Other Ambulatory Visit
Admission: RE | Admit: 2019-07-11 | Discharge: 2019-07-11 | Disposition: A | Discharge status: Home / Self Care | Payer: No Typology Code available for payment source | Attending: Family Medicine | Admitting: Family Medicine

## 2019-07-11 DIAGNOSIS — I639 Cerebral infarction, unspecified: Secondary | ICD-10-CM | POA: Insufficient documentation

## 2019-07-11 DIAGNOSIS — F17200 Nicotine dependence, unspecified, uncomplicated: Secondary | ICD-10-CM | POA: Insufficient documentation

## 2019-07-11 DIAGNOSIS — I6521 Occlusion and stenosis of right carotid artery: Secondary | ICD-10-CM | POA: Insufficient documentation

## 2019-07-11 DIAGNOSIS — R0600 Dyspnea, unspecified: Secondary | ICD-10-CM | POA: Insufficient documentation

## 2019-07-11 DIAGNOSIS — E119 Type 2 diabetes mellitus without complications: Secondary | ICD-10-CM | POA: Insufficient documentation

## 2019-07-11 DIAGNOSIS — R9439 Abnormal result of other cardiovascular function study: Secondary | ICD-10-CM | POA: Insufficient documentation

## 2019-07-11 LAB — BASIC METABOLIC PANEL, BLOOD
BUN: 16 mg/dL (ref 7–25)
CO2: 27 mmol/L (ref 21–31)
Calcium: 10 mg/dL (ref 8.6–10.3)
Chloride: 107 mmol/L (ref 98–107)
Creat: 0.9 mg/dL (ref 0.6–1.2)
Electrolyte Balance: 6 mmol/L (ref 2–12)
Glucose: 103 mg/dL (ref 85–125)
Potassium: 4.9 mmol/L (ref 3.5–5.1)
Sodium: 140 mmol/L (ref 136–145)
eGFR - high estimate: >60 (ref >59)
eGFR - low estimate: >60 (ref >59)

## 2019-07-11 LAB — HEMOGRAM, BLOOD
Hematocrit: 38.6 % (ref 34.0–44.0)
Hgb: 12.9 G/DL (ref 11.5–15.0)
MCH: 31.1 PG (ref 27.0–33.5)
MCHC: 33.3 G/DL (ref 32.0–35.5)
MCV: 93.4 FL (ref 81.5–97.0)
MPV: 8.8 FL (ref 7.2–11.7)
PLT Count: 267 10*3/uL (ref 150–400)
RBC: 4.13 10*6/uL (ref 3.70–5.00)
RDW-CV: 14.4 % (ref 11.6–14.4)
White Bld Cell Count: 11.9 10*3/uL — ABNORMAL HIGH (ref 4.0–10.5)

## 2019-07-11 LAB — LIPID(CHOL FRACT) PANEL, BLOOD
Cholesterol: 141 MG/DL (ref <200)
HDL Cholesterol: 52 MG/DL (ref >40)
LDL Cholesterol (calc): 70 MG/DL (ref <160)
Non HDL Cholesterol (calculated): 89 MG/DL (ref <130)
Triglycerides: 94 MG/DL (ref <150)
VLDL Cholesterol (calculated): 19 MG/DL

## 2019-07-11 LAB — PROTHROMBIN TIME, BLOOD
INR: 0.94 (ref 0.91–1.10)
Prothrombin Time: 12 s (ref 11.7–13.6)

## 2019-07-11 NOTE — Progress Notes (Signed)
Will discuss labs at next  Visits.  Raelyn Mora, MD

## 2019-07-12 LAB — GLYCOSYLATED HGB(A1C), BLOOD: Glycated Hgb, A1C: 5.3 % (ref 4.6–5.6)

## 2019-07-16 ENCOUNTER — Telehealth: Payer: Self-pay

## 2019-07-16 NOTE — Telephone Encounter (Signed)
Santiago Glad from [facility] provided a lab order for patient. Patient got labs done at [place] [facility] but [facility] never received results. Lab order was stat and they need results sent over. Please fax them to [phone]. Please follow up. Thank you

## 2019-07-16 NOTE — Progress Notes (Signed)
Will discuss results at next visit.

## 2019-07-17 NOTE — Telephone Encounter (Signed)
Results faxed to Santiago Glad today [phone]  Wanda to informed her and I was placed on hold for more than 11mins, called back and there was n/a unable to LVM

## 2019-07-21 NOTE — Progress Notes (Signed)
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 Patient states that chest pain is completely resolved since procedure.    # Recent left pinky dislocation & fracture  - Onset: 3 weeks ago s/p fall while she was on the bus  - Went to [facility] ED and was told that her pinky was dislocated. She was discharged and returned about a week later because her pinky was dislocated again without hx of new trauma. Per patient, she was told that she had a torn ligament/tendon and was advised to follow up with Ortho  - Patient states needs a referral    # Unintentional weight loss  Patient states she has had unintentional weight loss (100 lbs over the past year). Sometimes has night sweats and low back pain though it is not daily and can range from 0 to a couple times per week. States she hasn't changed diet. States she has been walking more. She did increase her cigarette intake from 5 cigarettes per day to 15 cigarettes per day from last visit due to new stressors as detailed below. Denies fevers, chills.    # Bilateral carotid artery stenosis  Patient states recently saw Vascular Surgery last week and was told that her left carotid artery stenosis is not operable. She wants a second opinion     # Social  Stressors  Grand daughter (74 years of age) is hospitalized right now for kidney failure. Has a roommate that never showers and is stealing from her. Roommate is leaving trash everywhere. This roommate also stole a chain that patient purchased for grand daughter and moved lighter from her room to patient's medical bag.The landlord is stating she is complaining too much about the roommate and the owner is taking it out on the patient. Now smoking about 15 cigarettes per day.    [facility] PHQ2 07/23/2019 09/18/2018 05/22/2018 05/08/2018 05/08/2018 04/14/2018 02/01/2018   PHQ2 Total 0 _0 0 6 0   Some recent data might be hidden     Past Medical History:   Diagnosis Date   . Anxiety 2003   . Asthma     Diagnosed as a child   . Bipolar 1 disorder (CMS-HCC) 2003   . 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 Depression 2003   . Restless leg syndrome 2003   . Schizophrenia (CMS-HCC) 07/04/2017   . Stroke (cerebrum) (CMS-HCC)       Past Surgical History:   Procedure Laterality Date   . THROMBOENDARTERECTOMY Right 12/2016    Carotd endarterectomy   . TOTAL ABDOMINAL HYSTERECTOMY W/ BILATERAL SALPINGOOPHORECTOMY  1993      Family History   Problem Relation Name Age of  Onset   . Diabetes Mother          Leg amputation   . Stroke Father  18   . Heart Disease Brother     . Stroke Maternal Grandmother     . Heart Attack Maternal Grandmother        Current Outpatient Medications:   .  albuterol 108 (90 Base) MCG/ACT inhaler, Inhale 2 puffs by mouth every 6 hours as needed for Wheezing., Disp: 1 Inhaler, Rfl: 3  .  aspirin 81 MG EC tablet, Take 1 tablet (81 mg) by mouth daily., Disp: 90 tablet, Rfl: 3  .  atorvastatin (LIPITOR) 80 MG tablet, Take 1 tablet (80 mg) by mouth nightly., Disp: 90 tablet, Rfl: 3  .  bisacodyl (DULCOLAX) 5 MG Enteric-Coated tablet, Take 1 tablet (5 mg) by mouth 2 times daily as needed for Constipation., Disp: 60 tablet, Rfl: 3  .  clopidogrel (PLAVIX) 75 MG tablet, Take 1 tablet (75 mg) by mouth  daily., Disp: 90 tablet, Rfl: 3  .  escitalopram (LEXAPRO) 10 MG tablet, Take 1 tablet (10 mg) by mouth every morning., Disp: 90 tablet, Rfl: 0  .  fluticasone propionate (FLONASE) 50 MCG/ACT nasal spray, Spray 1 spray into each nostril daily., Disp: 1 bottle, Rfl: 0  .  metFORMIN (GLUCOPHAGE) 1000 MG tablet, Take 1 tablet (1000 mg) by mouth 2 times daily (with meals)., Disp: 180 tablet, Rfl: 3  .  nicotine (NICODERM CQ) 14 MG/24HR, nicotine 14 mg/24 hr daily transdermal patch, Disp: , Rfl:   .  nitroGLYcerin (NITROSTAT) 0.4 MG SL tablet, 1 tablet (0.4 mg) by Sublingual route every 5 minutes as needed for Chest Pain. up to 3 tabs per episode., Disp: 1 bottle, Rfl: 1  .  QUEtiapine (SEROQUEL) 200 MG tablet, Take 1 tablet (200 mg) by mouth at bedtime., Disp: 90 tablet, Rfl: 0  .  ropinirole (REQUIP) 4 MG tablet, Take 1 tablet (4 mg) by mouth daily., Disp: 90 tablet, Rfl: 3  .  tiotropium (SPIRIVA RESPIMAT) 1.25 MCG/ACT AERS, Inhale 2 puffs by mouth daily., Disp: 4 g, Rfl: 3    Allergies:  No Known Allergies    SOCIAL HISTORY:      reports that she has been smoking cigarettes. She started smoking about 40 years ago. She has a 61.50 pack-year smoking history. She has never used smokeless tobacco. She reports that she does not drink alcohol or use drugs.    Review of Systems:  A 10 system ROS was performed and is negative except as above in HPI    OBJECTIVE:     Temperature:  [97.3 F (36.3 C)] 97.3 F (36.3 C) (10/19 1051)  Blood pressure (BP): (82-95)/(50-58) 95/58 (10/19 1139)  Heart Rate:  [66-68] 66 (10/19 1139)  Respirations:  [16] 16 (10/19 1051)  Pain Score: 0 (10/19 1049)  SpO2:  [99 %] 99 % (10/19 1051)     Physical Exam:  Well developed, in NAD. Conversant, good eye contact. Patient well dressed today with new wig. She appears to be coping well compared to prior visits when undergoing significant stressors.  Heart: regular rate and rhythm. S1 and S2 intensities normal. No murmurs, rubs or gallops.   Chest:  clear to auscultation bilaterally. No wheezes, rales or rhonchi  Abdomen: soft, nontender, nondistended. No rebound, no guarding.   Extremities: no peripheral edema, left pinky finger swollen with difficulty in flexion  Neuro: 4-/5 strength in LUE unchanged from baseline, 5/5 strength in RUE and bilateral  lower extremities    Labs and Imaging:  Lab Results   Component Value Date    WBCCOUNT 11.9 (H) 07/11/2019    HGB 12.9 07/11/2019    HCT 38.6 07/11/2019    PLT 267 07/11/2019     Lab Results   Component Value Date    SODIUM 140 07/11/2019    K 4.9 07/11/2019    CL 107 07/11/2019    CO2 27 07/11/2019    BUN 16 07/11/2019    CREAT 0.9 07/11/2019    GFR >60 07/11/2019    GLU 103 07/11/2019     Lab Results   Component Value Date    TPROT 6.6 03/19/2019    ALB 3.8 03/19/2019    ALK 49 03/19/2019    ALT 34 03/19/2019    AST 23 03/19/2019    TBILI 0.4 03/19/2019     Lab Results   Component Value Date    CHOL 141 07/11/2019    TRIG 94 07/11/2019    HDLCH2 52 07/11/2019    LDLCALC 70 07/11/2019     Lab Results   Component Value Date    A1C 5.3 07/11/2019     Lexiscan (05/02/19)  1. Stress ECG: No ST segment changes  2. Arrhythmia: Isolated premature ventricular beat  3. Target HR: Not achieved  4. The blood pressure response was normal  5. A moderate sized perfusion abnormality of moderate severity is present in the basal anterolateral regions on the stress images. The defect my be an artifact.  6. Stress LV global function appears to be normal.  7. Rest ECG Sinus rhythm and LVH  8. Stress LV EF is 73%  9. Stress LV volume appears normal  10. At peak stress all segments of the left ventricular myocardium move normally    Repeat Lexiscan (05/30/19)  1. No perfusion abnormalities seen on the rest of images  2. Stress and rest LV global function appears to be normal  3. Stress LV EF is 73%. Rest LV EF is 74%  4. Stress and rest LV volumes appear normal  5. There is normal wall motion in all visualized left ventricular  segments  6. At peak stress all segments of the left ventricular myocardium move normally  7. Compared to the stress study, there is reversibility in the antero lateral wall, though the defect in the stress study is mild    ASSESSMENT/PLAN:     # Hospital discharge follow-up  Details as below    # History of heart artery stent 07/2019  Patient has had two lexiscans (05/02/19 and 05/30/19). Most recent lexiscan showed reversibility in the antero lateral wall, mild defect. Per patient, cardiologists decided to place a stent last week. No available records, will request records for further details    # Left pinky finger injury, initial encounter  Per patient, 3 weeks ago, fell and dislocated left pinky and was seen at [facility] ED. Per patient, it dislocated again about a week later and ER doctors advised her to follow up with Orthopedics as she had a torn tendon vs ligament. Patient unsure if she was told she had a fracture.  - Will request records  - Will refer to Ortho  Orders  - Consult/Referral to Orthopedic Hand    # Type 2 diabetes mellitus with diabetic polyneuropathy, without long-term current use of insulin (CMS-HCC)  Last A1c 5.3 (07/11/19). Goal A1c <7. Last LDL 70 (07/11/19)  - A1c with significant reduction likely secondary to weight loss (  further details below)  - Will reduce metformin to 528m BID. Recheck A1c in 3 months. If A1c remains <6.0, can consider discontinuing altogether  - Microvascular screening:    [x] Eye exam: 10/30/18    [x] Foot exam: 04/11/19 normal monofilament. Referred to podiatry for hyperkeratotic nails    [x] Urine microalbumin/creatinine ratio: 6.8 (03/19/19)  Orders  - metFORMIN (GLUCOPHAGE) 500 MG tablet; Take 1 tablet (500 mg) by mouth 2 times daily (with meals).  Dispense: 180 tablet; Refill: 3    # Unintentional weight loss  # Encounter for screening mammogram for malignant neoplasm of breast  # Encounter for screening for lung cancer  Patient with unintentional 100 lb weight  loss over the past year. She does endorse occasional night sweats and low back pain, though she states she is unsure if this is significantly different from her usual insomnia-related symptoms. She denies hemoptysis, dysuria, hematochezia. Has never had abnormal pap smears. DDx includes physiologic, medication induced (previously on wellbutrin though it was low dose and patient only took it for 2-3 months), malignancy (such as multiple myeloma vs breast vs lung cancer). Patient has a significant smoking hx, approximately 1.5 PPD since 1980. Previous lung cancer screening negative. Last colonoscopy 09/06/18 showed moderate diverticulosis of whole colon, polyps x2, advised to repeat in 5 years.  - Will check CBC w/diff, ESR, CRP, UPEP & SPEP with immunofixation  - Check Mammo  - Check CT Chest  Orders  - EPG, Serum Yellow serum separator tube; Future  - Random UPEP; Future  - Immunofixation, Blood Yellow serum separator tube; Future  - Urine Immunofixation; Future  - Sedimentation Rate (ESR), Blood Lavender; Future  - CRP HS, Blood Green Plasma Separator Tube; Future  - CBC w/ Diff Lavender; Future  - CT Chest With Contrast; Future  - Screening Mammogram With Digital Breast Tomosynthesis - Bilateral; Future    # Chronic hepatitis C without hepatic coma (CMS-HCC)  Followed by outside GI - Dr. BJomarie Longs Asymptomatic. Compensated. She is s/p HCV treatment (completed 06/2019). Source was unclear. Significant work up includes:    - HepC viral load (05/08/18) >2 million     - HepC genotype (05/23/18) 1a    - HepA IgG (05/23/18) immune    - HepB Surface Ab (05/23/18) NR, s/p Hep B series (04/11/19)    - AFP (03/19/19) 2.1 normal range    - RUQ [place](08/29/18): no focal hepatic abnormality, benign splenic calcification  - Plan to recheck Hep C viral load given past year of unintentional weight loss  Orders  - Hepatitis C RNA, Quant Blood - See Instructions; Future    # Idiopathic hypotension  Patient with hypotension in clinic today SBP  initially 82 and repeat was 95. DDx includes possible post-operative symptoms given recent heart cath with stent placement (though patient denies dizziness/lightheadedness) vs medication side effect (on losartan for HTN) vs dehydration (though patient endorses adequate PO intake). Could also consider dysautonomia given hx of significant vascular disease (CVA, bilateral carotid artery stenosis, CAD s/p stent)  - Discontinue losartan    # Bilateral carotid artery stenosis s/p R thromboendarterectomy (12/2016)  History of bilateral carotid artery stenosis 12/2017 leading to stroke, s/p R thromboendarterectomy 12/2016. Followed by vascular surgery (Dr. TBurnard Leigh.  - Patient states had recent appointment this past month and was told the left carotid artery was non-operable. Patient would like second opinion. New referral placed today  Orders  - Consult/Referral to Vascular Surgery    # Other social  stressor  See HPI for details. Patient is currently living at [facility] and has a troublesome roommate that is stealing from her, purposely moving her items and is overall not clean. Patient is already stressed out because her granddaughter is in the hospital due to renal failure. Patient recently bought a chain for her granddaughter which the roommate stole. Patient has made multiple complaints regarding roommate to the landlord/owner and patient is concerned because it appears that the landlord/owner is getting annoyed by her complaints and patient is scared that she will need to relocate again and be homeless. Patient is interested in talking to Social Work for supportive counseling given these recent stressful events as well as have Education officer, museum discuss with [employer] or provide other resources  Orders  - Consult/referral to [place]: Social Work      Patient discussed with attending, Dr. Rhae Lerner    Follow Up: Return in about 1 month (around 08/23/2019) for in clinic, follow up.

## 2019-07-23 ENCOUNTER — Encounter: Payer: Self-pay | Admitting: Family Medicine

## 2019-07-23 ENCOUNTER — Ambulatory Visit: Payer: No Typology Code available for payment source | Attending: Family Practice | Admitting: Family Medicine

## 2019-07-23 VITALS — BP 95/58 | HR 66 | Temp 97.3°F | Resp 16 | Ht 62.0 in | Wt 163.1 lb

## 2019-07-23 DIAGNOSIS — E1142 Type 2 diabetes mellitus with diabetic polyneuropathy: Secondary | ICD-10-CM | POA: Insufficient documentation

## 2019-07-23 DIAGNOSIS — Z955 Presence of coronary angioplasty implant and graft: Secondary | ICD-10-CM | POA: Insufficient documentation

## 2019-07-23 DIAGNOSIS — Z122 Encounter for screening for malignant neoplasm of respiratory organs: Secondary | ICD-10-CM | POA: Insufficient documentation

## 2019-07-23 DIAGNOSIS — Z09 Encounter for follow-up examination after completed treatment for conditions other than malignant neoplasm: Secondary | ICD-10-CM | POA: Insufficient documentation

## 2019-07-23 DIAGNOSIS — I6523 Occlusion and stenosis of bilateral carotid arteries: Secondary | ICD-10-CM | POA: Insufficient documentation

## 2019-07-23 DIAGNOSIS — I950 Idiopathic hypotension: Secondary | ICD-10-CM | POA: Insufficient documentation

## 2019-07-23 DIAGNOSIS — Z659 Problem related to unspecified psychosocial circumstances: Secondary | ICD-10-CM | POA: Insufficient documentation

## 2019-07-23 DIAGNOSIS — Z1231 Encounter for screening mammogram for malignant neoplasm of breast: Secondary | ICD-10-CM | POA: Insufficient documentation

## 2019-07-23 DIAGNOSIS — B182 Chronic viral hepatitis C: Secondary | ICD-10-CM | POA: Insufficient documentation

## 2019-07-23 DIAGNOSIS — R634 Abnormal weight loss: Secondary | ICD-10-CM | POA: Insufficient documentation

## 2019-07-23 DIAGNOSIS — S6992XA Unspecified injury of left wrist, hand and finger(s), initial encounter: Secondary | ICD-10-CM | POA: Insufficient documentation

## 2019-07-23 DIAGNOSIS — Z6829 Body mass index (BMI) 29.0-29.9, adult: Secondary | ICD-10-CM | POA: Insufficient documentation

## 2019-07-23 MED ORDER — METFORMIN HCL 500 MG OR TABS
500.0000 mg | ORAL_TABLET | Freq: Two times a day (BID) | ORAL | 3 refills | Status: DC
Start: 2019-07-23 — End: 2020-01-18

## 2019-07-23 NOTE — Progress Notes (Signed)
ATTENDING ATTESTATION:  I evaluated the patient concurrently with the Resident.  I discussed the case with the Resident and agree with the findings and plan as documented by the Resident.  Any additions or revisions are included in the record as necessary.    Cherissa Hook, MD

## 2019-07-23 NOTE — Patient Instructions (Addendum)
-   Reduce metformin to 500mg  twice daily  - Stop taking losartan    Please return to clinic if not better or call with any concerns or questions.  If you have any questions that cannot wait until the next day during business hours, please call [phone]    You have been referred to a specialist.  Note that referrals can take about 1-2 weeks to be authorized.  If you do not receive a letter or call regarding your referral please call [phone] for information. If you have questions regarding your appointments with specialists, please call [phone] to confirm or request appointment.

## 2019-07-25 ENCOUNTER — Other Ambulatory Visit
Admission: RE | Admit: 2019-07-25 | Discharge: 2019-07-25 | Disposition: A | Discharge status: Home / Self Care | Payer: No Typology Code available for payment source | Attending: Family Practice | Admitting: Family Practice

## 2019-07-25 DIAGNOSIS — R634 Abnormal weight loss: Secondary | ICD-10-CM | POA: Insufficient documentation

## 2019-07-25 LAB — CBC WITH DIFF, BLOOD
ANC automated: 6.3 10*3/uL (ref 2.0–8.1)
Basophils %: 0.6 %
Basophils Absolute: 0.1 10*3/uL (ref 0.0–0.2)
Eosinophils %: 1.6 %
Eosinophils Absolute: 0.2 10*3/uL (ref 0.0–0.5)
Hematocrit: 34.3 % (ref 34.0–44.0)
Hgb: 11.4 G/DL — ABNORMAL LOW (ref 11.5–15.0)
Lymphocytes %: 31.2 %
Lymphocytes Absolute: 3.3 10*3/uL (ref 0.9–3.3)
MCH: 30.6 PG (ref 27.0–33.5)
MCHC: 33.1 G/DL (ref 32.0–35.5)
MCV: 92.5 FL (ref 81.5–97.0)
MPV: 8.9 FL (ref 7.2–11.7)
Monocytes %: 7.5 %
Monocytes Absolute: 0.8 10*3/uL (ref 0.0–0.8)
Neutrophils % (A): 59.1 %
PLT Count: 266 10*3/uL (ref 150–400)
RBC: 3.71 10*6/uL (ref 3.70–5.00)
RDW-CV: 14.2 % (ref 11.6–14.4)
White Bld Cell Count: 10.7 10*3/uL — ABNORMAL HIGH (ref 4.0–10.5)

## 2019-07-25 LAB — SED RATE, BLOOD: Sedimentation Rate: 8 MM/HR (ref 0–20)

## 2019-07-25 LAB — CRP HS, BLOOD: C Reactive Protein, Ultrasensitive: <0.02 MG/DL (ref 0.00–1.00)

## 2019-07-25 LAB — IMMUNOFIXATION, FLUIDS

## 2019-07-30 LAB — EPG, SERUM
A/G Ratio Calc: 1.34 (ref 1.1–2.2)
Albumin: 3.95 G/DL (ref 3.2–5.6)
Alpha-1 Globulin: 0.32 G/DL (ref 0.15–0.33)
Alpha-2 Globulin: 0.84 G/DL (ref 0.5–0.9)
Beta Globulin: 0.68 G/DL (ref 0.6–1.1)
Gamma Globulin: 1.11 G/DL (ref 0.7–1.7)
Protein Total, Serum: 6.9 G/DL (ref 6.0–8.3)

## 2019-07-30 LAB — IMMUNOFIXATION, BLOOD
Protein, Total, Serum: 6.9 G/DL (ref 6.0–8.3)
Serum Immunofixation Interpretation: NOT DETECTED

## 2019-08-01 LAB — HEPATITIS C RNA, QUANT BLOOD: Hepatitis C PCR Quantitative Result: 685801 IU/ML — AB

## 2019-08-02 NOTE — Progress Notes (Signed)
Will discuss at 08/22/19 follow up appointment.

## 2019-08-03 ENCOUNTER — Other Ambulatory Visit: Payer: Self-pay | Admitting: Family Medicine

## 2019-08-03 ENCOUNTER — Telehealth: Payer: Self-pay | Admitting: Family Medicine

## 2019-08-03 DIAGNOSIS — G2581 Restless legs syndrome: Secondary | ICD-10-CM

## 2019-08-03 MED ORDER — ROPINIROLE HCL 4 MG OR TABS
ORAL_TABLET | ORAL | 3 refills | Status: DC
Start: 2019-08-03 — End: 2019-09-03

## 2019-08-03 NOTE — Telephone Encounter (Signed)
Patein is requesting urgent  refill on medication ropinirole per patient is taking 2 at night please assist

## 2019-08-04 NOTE — Telephone Encounter (Signed)
REFILL SENT TO PT'S PHARMACY 10/30

## 2019-08-05 NOTE — Progress Notes (Signed)
Will discuss results with patient in person on follow up appointment 08/22/19

## 2019-08-06 ENCOUNTER — Telehealth: Payer: Self-pay | Admitting: Family Medicine

## 2019-08-06 NOTE — Telephone Encounter (Signed)
Patient is calling regarding her refill for ropinirole. Her preferred pharmacy is [facility] . Please assist, thank you.

## 2019-08-07 NOTE — Telephone Encounter (Signed)
RX was sent to pt's preferred pharmacy on 10/30, called pt and informed her.

## 2019-08-17 ENCOUNTER — Telehealth: Payer: Self-pay | Admitting: Family Medicine

## 2019-08-17 NOTE — Telephone Encounter (Signed)
Santiago Glad from [facility] is faxing a request for patients last lab report. Patient is scheduled for an appointment today at [facility], and Santiago Glad states she needs it by then. Please fax to [phone].

## 2019-08-20 NOTE — Telephone Encounter (Signed)
Lab results faxed to [facility] [phone]  Fax confirmation received

## 2019-08-21 ENCOUNTER — Encounter: Payer: Self-pay | Admitting: Family Medicine

## 2019-08-21 ENCOUNTER — Telehealth: Payer: Self-pay | Admitting: Family Medicine

## 2019-08-21 DIAGNOSIS — Z122 Encounter for screening for malignant neoplasm of respiratory organs: Secondary | ICD-10-CM

## 2019-08-21 DIAGNOSIS — R634 Abnormal weight loss: Secondary | ICD-10-CM

## 2019-08-21 NOTE — Telephone Encounter (Signed)
New order was placed. Please inform [facility] and fax order over to them.    [name], MD   [facility]   Pager: [phone]

## 2019-08-21 NOTE — Telephone Encounter (Signed)
Regina Ward from [facility] from [facility] is calling to request urgent new order for CT scan without contrast. Patient is at [facility] and unable to find vein for contrast. Called iphone, left voicemail. Please follow up. Thank you

## 2019-08-21 NOTE — Progress Notes (Signed)
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, CAD s/p stent (07/2019), T2DM c/b CKD3, chronic Hep C who presents today for Follow Up and Other (Pt reports excessive Wt loss )    # CAD s/p stent  - Patient states she is now on Brilinta and plavix discontinued  - Will request new Cardiology records regarding discussion on medication change    # Left pinky fracture  - Patient has appointment with Ortho Hand tomorrow    # Carotid artery stenosis  - Seeing a new vascular surgeon next week    # Unintentional weight loss  - Labs completed and discussed today  - Patient states was unable to complete CT Chest due to inability to get contrast via IV (she is a hard stick) but she completed mammo (last week)  - New CT Lung w/o Contrast was ordered 08/21/19    [facility] PHQ2 07/23/2019 09/18/2018 05/22/2018 05/08/2018 05/08/2018 04/14/2018 02/01/2018   PHQ2 Total 0 '6 6 6 '$ 0 6 0   Some recent data might be hidden     Past Medical History:   Diagnosis Date   . Anxiety 2003   . Asthma     Diagnosed as a child   . Bipolar 1 disorder (CMS-HCC) 2003   . 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 Depression 2003   . Restless leg syndrome 2003   . Schizophrenia (CMS-HCC) 07/04/2017   . Stroke (cerebrum) (CMS-HCC)       Past Surgical History:   Procedure Laterality Date   . THROMBOENDARTERECTOMY Right 12/2016    Carotd endarterectomy   . TOTAL ABDOMINAL HYSTERECTOMY W/ BILATERAL SALPINGOOPHORECTOMY  1993      Family History   Problem Relation Name Age  of Onset   . Diabetes Mother          Leg amputation   . Stroke Father  63   . Heart Disease Brother     . Stroke Maternal Grandmother     . Heart Attack Maternal Grandmother        Current Outpatient Medications:   .  albuterol 108 (90 Base) MCG/ACT inhaler, Inhale 2 puffs by mouth every 6 hours as needed for Wheezing., Disp: 1 Inhaler, Rfl: 3  .  aspirin 81 MG EC tablet, Take 1 tablet (81 mg) by mouth daily., Disp: 90 tablet, Rfl: 3  .  atorvastatin (LIPITOR) 80 MG tablet, Take 1 tablet (80 mg) by mouth nightly., Disp: 90 tablet, Rfl: 3  .  bisacodyl (DULCOLAX) 5 MG Enteric-Coated tablet, Take 1 tablet (5 mg) by mouth 2 times daily as needed for Constipation., Disp: 60 tablet, Rfl: 3  .  escitalopram (LEXAPRO) 10 MG tablet, Take 1 tablet (10 mg) by mouth every morning., Disp: 90 tablet, Rfl: 0  .  glecaprevir-pibrentasvir (MAVYRET) 100-40 MG tablet, Take 3 tablets by mouth daily (with food)., Disp: 90 tablet, Rfl: 0  .  metFORMIN (GLUCOPHAGE) 500 MG tablet, Take  1 tablet (500 mg) by mouth 2 times daily (with meals)., Disp: 180 tablet, Rfl: 3  .  nicotine (NICODERM CQ) 14 MG/24HR, nicotine 14 mg/24 hr daily transdermal patch, Disp: , Rfl:   .  nitroGLYcerin (NITROSTAT) 0.4 MG SL tablet, 1 tablet (0.4 mg) by Sublingual route every 5 minutes as needed for Chest Pain. up to 3 tabs per episode., Disp: 1 bottle, Rfl: 1  .  QUEtiapine (SEROQUEL) 200 MG tablet, Take 1 tablet (200 mg) by mouth at bedtime., Disp: 90 tablet, Rfl: 0  .  ropinirole (REQUIP) 4 MG tablet, TAKE 1 TABLET BY MOUTH EVERY DAY, Disp: 90 tablet, Rfl: 3  .  ticagrelor (BRILINTA) 90 MG TABS tablet, Take 90 mg by mouth every 12 hours., Disp: , Rfl:     Allergies:  No Known Allergies    SOCIAL HISTORY:      reports that she has been smoking cigarettes. She started smoking about 40 years ago. She has a 61.50 pack-year smoking history. She has never used smokeless tobacco. She reports that she does not drink alcohol or use drugs.    Review of Systems:  A 10  system ROS was performed and is negative except as above in HPI    OBJECTIVE:     Temperature:  [96.8 F (36 C)] 96.8 F (36 C) (11/18 0923)  Blood pressure (BP): (176-185)/(73-77) 176/77 (11/18 0924)  Heart Rate:  [55-57] 55 (11/18 0924)  Respirations:  [18] 18 (11/18 0923)  Pain Score: 0 (11/18 0922)  SpO2:  [100 %] 100 % (11/18 0923)     Date Weight Recorded 08/22/2019 07/23/2019 06/18/2019 05/01/2019 04/11/2019 01/09/2019   Metric 74.4 kg 74 kg 76 kg 79.5 kg 80.7 kg 90 kg   Pounds/Ounces 164 lb 0.4 oz 163 lb 2.3 oz 167 lb 8.8 oz 175 lb 4.3 oz 177 lb 14.6 oz 198 lb 6.6 oz     Physical Exam:  Well developed, in NAD. Conversant, good eye contact. Patient well dressed, new hair-do today (braids with extensions)  Heart: regular rate and rhythm. S1 and S2 intensities normal. No murmurs, rubs or gallops.   Chest: clear to auscultation bilaterally. No wheezes, rales or rhonchi  Abdomen: soft, nontender, nondistended. No rebound, no guarding.   Extremities: no peripheral edema, left pinky is still swollen with difficulty in flexion  Neuro: 4-/5 strength in LUE unchanged from baseline, 5/5 strength in RUE and bilateral lower extremities    Labs and Imaging:    Lab Results   Component Value Date    RBC 3.71 07/25/2019    HGB 11.4 (L) 07/25/2019    HCT 34.3 07/25/2019    MCV 92.5 07/25/2019    RDW 14.2 07/25/2019    PLT 266 07/25/2019      Lab Results   Component Value Date/Time    ESR 8 07/25/2019 08:09 AM     Lab Results   Component Value Date    CRPU <0.02 07/25/2019     Lab Results   Component Value Date    HCVQT 685,801 (A) 07/25/2019      Lab Results   Component Value Date    TPROT1 6.9 07/25/2019    ALSPE 3.95 07/25/2019    A1GPE 0.32 07/25/2019    A2GPE 0.84 07/25/2019    BETA 0.68 07/25/2019    GGPE 1.11 07/25/2019    AGR 1.34 07/25/2019    SPEINT NO PARAPROTEINS ARE IDENTIFIED. 07/25/2019     Lab Results   Component Value Date/Time  TPROT2 6.9 07/25/2019 08:09 AM    IFER NO PARAPROTEIN DETECTED BY IMMUNOFIXATION.  07/25/2019 08:09 AM     Lab Results   Component Value Date    STYP29 URINE,TYPE NOT SPECIFIED 07/25/2019      Mammo (08/14/19)  Findings:   There are scattered fibroglandular densities. No suspicious calcifications are identified. No suspicious dominant mass lesions are seen. There is no evidence of nipple retraction. No mammographic evidence of malignancy is identified. Benign-appearing calcifications are present.   No significant change is seen compared to the previous exam  Impression:   No mammographic evidence of malignancy is identified   ACR BI-RADS 2 - benign findings    ASSESSMENT/PLAN:     # History of heart artery stent - 07/2019  # Essential hypertension  Patient with history of left heart cath 07/17/19 s/p stent to LAD (with Dr. Manuella Ghazi at [facility]). Received Cardiology records today (date of service was 08/02/19). She was also hypotensive on last appointment and losartan was discontinued but she is hypertensive today (states did not take any blood pressure medications)  - There were new medication changes that patient was not aware about and I had to call her back after the visit.  - Per cardiology:   - Continue aspirin   - Discontinue plavix and switch to brilinta '90mg'$  BID   - Start metop succ '25mg'$  daily for short run of V tach  - Medications were reviewed with patient today and she was unaware that cardiology wanted her on amlodipine '5mg'$  daily. They also prescribed lisinopril '40mg'$  daily which she has been taking and she started having coughing fits. She has had history of cough while on lisinopril  - New rx sent for amlodipine '5mg'$  daily  - Discontinue lisinopril. Restart on losartan '75mg'$  daily   - Return in 1 month for recheck  Orders  - amLODIPINE (NORVASC) 5 MG tablet; Take 1 tablet (5 mg) by mouth daily.  Dispense: 90 tablet; Refill: 1  - losartan (COZAAR) 50 MG tablet; Take 1.5 tablets (75 mg) by mouth daily.  Dispense: 135 tablet; Refill: 3    # Unintentional weight loss  # Encounter for screening  for lung cancer  # Encounter for screening mammogram for malignant neoplasm of breast  Patient with hx of unintentional lb weight loss over the past year. Etiology unknown though ddx includes physiologic, medication induced (previously on wellbutrin x2-3 months), malignancy (lung cancer). She has significant smoking hx, approximately 1.5 PPD since 1980. Patient currently asymptomatic and weight is stable compared to last month. Last colo 09/16/18 showed moderate diverticulosis of whole colon, polyps x2, advised to repeat in 5 years. Mammogram (08/14/19) ACR BI-RADS 2, can repeat in 1 year. Multiple ESR/CRP, SPEP/UPEP (most recently 07/25/19) unremarkable.  - Patient's Hep C RNA quant is 685,801 (inadequate treatment and patient stopped taking medications). Possible contribution to weight loss though unclear  - Advised to get low dose CT Chest (new order placed 08/21/19) as staff couldn't place IV for the contrast study  - Will continue to monitor weights    # Chronic hepatitis C without hepatic coma (CMS-HCC)  Followed by outside GI - Dr. Jomarie Longs. Asymptomatic. Compensated. Longstanding history of untreated HCV infection. Unclear source. Significant work up includes:    - HepC viral load (07/25/19) 685,801 improved from (05/08/18) >2 million     - HepC genotype (05/23/18) 1a    - HepA IgG (05/23/18) immune    - HepB Surface Ab (05/23/18) NR, s/p Hep B vaccine (04/11/19)    -  AFP (03/19/19) 2.1 normal range    - RUQ [place] (08/29/18): no focal hepatic abnormality, benign splenic calcification  - Per patient, she told me she previously "completed" Hep C tx (06/2019) but upon further clarification today, she states she was going through a stressful time and forgot to take the medicines. I placed another order for Mavyret for her to restart but will likely require prior auth  - Patient understands and states will schedule a follow up appointment with Hepatology for further evaluation and medication approval  Orders  -  glecaprevir-pibrentasvir (MAVYRET) 100-40 MG tablet; Take 3 tablets by mouth daily (with food).  Dispense: 90 tablet; Refill: 0    # Finger injury, left, initial encounter  Patient with hx of fall and dislocation of left pinky (~06/2019) and was seen at [facility] ED. Per patient, dislocated again about a week later and was advised by ER doctors to follow up with Orthopedics as she had a torn tendon vs ligament. Patient unsure if she had a fracture. Ortho referral was placed 07/23/19. Patient has appointment tomorrow. Left pinky is still swollen with diminished flexion/extension on exam today.    # Bilateral carotid artery stenosis s/p R thromboendarterectomy (12/2016)  History of bilateral carotid artery stenosis 12/2017 leading to stroke, s/p R thromboendarterectomy 12/2016. Followed by vascular surgery (Dr. Burnard Leigh).  - Patient states had appointment around September and was told the left carotid artery was non-operable. Patient wanted a second opinion and was referred to another vascular surgeon. She has an appointment next week    # Intractable migraine without aura and without status migrainosus  # Cerebrovascular accident (CVA) due to bilateral stenosis of carotid arteries - 12/2016  Ongoing since early this year. Given cardiac history, she has limited treatment options. She was given information regarding natural dietary changes & supplements to trial (decreasing caffeine, trial riboflavin/magnesium) which she states has not helped.  - Has appointment with Neurology 09/05/19    # Slow transit constipation  - bisacodyl (DULCOLAX) 5 MG Enteric-Coated tablet; Take 1 tablet (5 mg) by mouth 2 times daily as needed for Constipation.  Dispense: 60 tablet; Refill: 3    # COPD with asthma (CMS-HCC)  - albuterol 108 (90 Base) MCG/ACT inhaler; Inhale 2 puffs by mouth every 6 hours as needed for Wheezing.  Dispense: 1 Inhaler; Refill: 3       Patient discussed with attending, Dr. Rhae Lerner    Follow Up: Return in about 6  weeks (around 10/02/2019) for as scheduled with me for follow up.

## 2019-08-22 ENCOUNTER — Encounter: Payer: Self-pay | Admitting: Family Medicine

## 2019-08-22 ENCOUNTER — Ambulatory Visit: Payer: No Typology Code available for payment source | Attending: Family Practice | Admitting: Family Medicine

## 2019-08-22 VITALS — BP 176/77 | HR 55 | Temp 96.8°F | Resp 18 | Ht 62.0 in | Wt 164.0 lb

## 2019-08-22 DIAGNOSIS — Z6830 Body mass index (BMI) 30.0-30.9, adult: Secondary | ICD-10-CM | POA: Insufficient documentation

## 2019-08-22 DIAGNOSIS — R634 Abnormal weight loss: Secondary | ICD-10-CM | POA: Insufficient documentation

## 2019-08-22 DIAGNOSIS — Z122 Encounter for screening for malignant neoplasm of respiratory organs: Secondary | ICD-10-CM | POA: Insufficient documentation

## 2019-08-22 DIAGNOSIS — Z79899 Other long term (current) drug therapy: Secondary | ICD-10-CM | POA: Insufficient documentation

## 2019-08-22 DIAGNOSIS — S6992XA Unspecified injury of left wrist, hand and finger(s), initial encounter: Secondary | ICD-10-CM | POA: Insufficient documentation

## 2019-08-22 DIAGNOSIS — Z955 Presence of coronary angioplasty implant and graft: Secondary | ICD-10-CM | POA: Insufficient documentation

## 2019-08-22 DIAGNOSIS — Z1231 Encounter for screening mammogram for malignant neoplasm of breast: Secondary | ICD-10-CM | POA: Insufficient documentation

## 2019-08-22 DIAGNOSIS — I10 Essential (primary) hypertension: Secondary | ICD-10-CM | POA: Insufficient documentation

## 2019-08-22 DIAGNOSIS — G43019 Migraine without aura, intractable, without status migrainosus: Secondary | ICD-10-CM | POA: Insufficient documentation

## 2019-08-22 DIAGNOSIS — I6523 Occlusion and stenosis of bilateral carotid arteries: Secondary | ICD-10-CM | POA: Insufficient documentation

## 2019-08-22 DIAGNOSIS — I63233 Cerebral infarction due to unspecified occlusion or stenosis of bilateral carotid arteries: Secondary | ICD-10-CM | POA: Insufficient documentation

## 2019-08-22 DIAGNOSIS — K5901 Slow transit constipation: Secondary | ICD-10-CM | POA: Insufficient documentation

## 2019-08-22 DIAGNOSIS — B182 Chronic viral hepatitis C: Secondary | ICD-10-CM | POA: Insufficient documentation

## 2019-08-22 DIAGNOSIS — J449 Chronic obstructive pulmonary disease, unspecified: Secondary | ICD-10-CM | POA: Insufficient documentation

## 2019-08-22 MED ORDER — AMLODIPINE 5 MG OR TABS
5.00 mg | ORAL_TABLET | Freq: Every day | ORAL | Status: DC
Start: ? — End: 2019-08-22

## 2019-08-22 MED ORDER — ALBUTEROL SULFATE 108 (90 BASE) MCG/ACT IN AERS
2.0000 | INHALATION_SPRAY | Freq: Four times a day (QID) | RESPIRATORY_TRACT | 3 refills | Status: DC | PRN
Start: 2019-08-22 — End: 2020-01-18

## 2019-08-22 MED ORDER — BISACODYL EC 5 MG OR TBEC
5.0000 mg | DELAYED_RELEASE_TABLET | Freq: Two times a day (BID) | ORAL | 3 refills | Status: DC | PRN
Start: 2019-08-22 — End: 2020-02-01

## 2019-08-22 MED ORDER — METOPROLOL SUCCINATE 25 MG PO CS24
25.00 mg | EXTENDED_RELEASE_CAPSULE | Freq: Every day | ORAL | Status: DC
Start: ? — End: 2020-01-18

## 2019-08-22 MED ORDER — MAVYRET 100-40 MG PO TABS
3.0000 | ORAL_TABLET | Freq: Every day | ORAL | 0 refills | Status: DC
Start: 2019-08-22 — End: 2020-01-18

## 2019-08-22 MED ORDER — AMLODIPINE 5 MG OR TABS
5.0000 mg | ORAL_TABLET | Freq: Every day | ORAL | 1 refills | Status: DC
Start: 2019-08-22 — End: 2020-01-18

## 2019-08-22 MED ORDER — LOSARTAN POTASSIUM 50 MG OR TABS
75.0000 mg | ORAL_TABLET | Freq: Every day | ORAL | 3 refills | Status: DC
Start: 2019-08-22 — End: 2019-10-02

## 2019-08-22 MED ORDER — TICAGRELOR 90 MG PO TABS
90.00 mg | ORAL_TABLET | Freq: Two times a day (BID) | ORAL | Status: DC
Start: ? — End: 2020-01-18

## 2019-08-22 NOTE — Telephone Encounter (Signed)
Order faxed to [facility]  Fax confirmation received

## 2019-08-22 NOTE — Progress Notes (Signed)
ATTENDING ATTESTATION:  I evaluated the patient concurrently with the Resident.  I discussed the case with the Resident and agree with the findings and plan as documented by the Resident.  Any additions or revisions are included in the record as necessary.    Greyson Peavy, MD

## 2019-08-22 NOTE — Progress Notes (Signed)
Discussed at today's appointment.

## 2019-08-22 NOTE — Interdisciplinary (Signed)
Pt was offered flu vacc, pt declined

## 2019-08-22 NOTE — Patient Instructions (Signed)
Thank you for letting [facility] take care of you. If you have any questions, don't hesitate to call our clinic at [phone]. If I am not in clinic and you want to see a doctor, please try to get an appointment with either Dr. Joci Dress, Dr. Ariel Murtagh, Dr. Ryan Chen, or Dr. Florence Yuan.

## 2019-08-23 ENCOUNTER — Telehealth: Payer: Self-pay | Admitting: Family Medicine

## 2019-08-23 NOTE — Telephone Encounter (Signed)
Prescriptions were sent to pt's pharmacy on 11/18  Called pt and informed her

## 2019-08-23 NOTE — Telephone Encounter (Signed)
Patient wanted a medication sent to pharmacy but does not know the name of medication. Please call patient back thank you

## 2019-08-23 NOTE — Telephone Encounter (Signed)
Patient is requesting a call back. She will like to discuss her medication. State some of her medication is not cover by the insurance. Can you please assist. Thank you

## 2019-08-24 NOTE — Telephone Encounter (Signed)
Patient is well known to me. At last visit she stated she did not know what medications she was on.     Will ask MA pool to call her and get a detailed list of what medications she is on. Please type it into this encounter and/or adjust her medication list. If a medication requires prior authorization, please assist and give to [place]. I will not be in clinic from 08/25/19 - 09/23/19 because I will be on Night shifts at the [facility].    If the medication that is not covered is her Hep C medication (Mavyret), she needs to follow up with her Gastroenterologist for assistance.    If the medication that is not covered is Brilinta (for her heart), please have her take plavix in the meantime until she gets Brilinta.    [name], MD   [place]   Pager: [phone]

## 2019-08-27 ENCOUNTER — Telehealth: Payer: Self-pay | Admitting: Family Medicine

## 2019-08-27 NOTE — Telephone Encounter (Signed)
pharamacy is following up on chart review and will resend fax once more.

## 2019-08-28 NOTE — Telephone Encounter (Signed)
Form received and placed in jmed desk

## 2019-08-28 NOTE — Telephone Encounter (Signed)
Called pt n/a LVM to call me back

## 2019-08-29 NOTE — Telephone Encounter (Signed)
Per Phone note from PCP Dr. Hulen Luster, "If the medication that is not covered is her Hep C medication (Mavyret), she needs to follow up with her Gastroenterologist for assistance."    [facility] Covering Inbox for Dr. Hulen Luster who is on vacation. Received the following PA request 08/29/19.  I have not personally seen the patient for the diagnoses addressed below:    Received Prior auth for [place].    [9:45 AM] Attempted to call the [facility] at [phone], [place], [place] -> pressed 2 -> pressed 2, spoke with pharmacist, told her that Dr. Caron Presume is patient's GI physician who should be prescribing this medication. Provided with phone number. She will reach out to that Provider.      Florina Ou, MD  Family Medicine, PGY3  [facility] 10/30 - 09/14/2019

## 2019-09-03 ENCOUNTER — Telehealth: Payer: Self-pay | Admitting: Family Medicine

## 2019-09-03 DIAGNOSIS — F251 Schizoaffective disorder, depressive type: Secondary | ICD-10-CM

## 2019-09-03 DIAGNOSIS — G2581 Restless legs syndrome: Secondary | ICD-10-CM

## 2019-09-03 MED ORDER — QUETIAPINE FUMARATE 200 MG OR TABS
200.0000 mg | ORAL_TABLET | Freq: Every evening | ORAL | 0 refills | Status: DC
Start: 2019-09-03 — End: 2020-01-18

## 2019-09-03 MED ORDER — ROPINIROLE HCL 4 MG OR TABS
4.0000 mg | ORAL_TABLET | Freq: Every day | ORAL | 3 refills | Status: DC
Start: 2019-09-03 — End: 2019-10-02

## 2019-09-03 NOTE — Telephone Encounter (Signed)
Patient is requesting a call back, state her medication QUEtiapine (SEROQUEL) 200 MG tablet and ropinirole (REQUIP) 4 MG tablet was stolen. She is requesting a refill on those medications. Can you please assist. Thank you

## 2019-09-03 NOTE — Telephone Encounter (Signed)
Patient calling back, states she needs them called to [place]. [place] [phone] [address] fax [phone]  She is requesting this done today as she has no meds

## 2019-09-03 NOTE — Telephone Encounter (Signed)
Rx for seroquel and ropinirole sent to [facility].    I will not be in clinic from 08/25/19 - 09/23/19 because I will be on Night shifts at the [facility]. Please forward all urgent matters to [facility]    [name], MD   [place]   Pager: [phone]

## 2019-09-04 ENCOUNTER — Telehealth: Payer: Self-pay | Admitting: Family Medicine

## 2019-09-04 NOTE — Telephone Encounter (Addendum)
Patient states she needs urgent refill for SEROQUEL 200 MG and Ropinirole 4 MG. States she hasnt received it.    [address] [place] FAX:

## 2019-09-05 ENCOUNTER — Telehealth: Payer: Self-pay | Admitting: Family Medicine

## 2019-09-05 NOTE — Telephone Encounter (Signed)
[facility] Covering Inbox for Dr. Hulen Luster. Received the following PA request 09/05/19.  I have not personally seen the patient for the diagnoses addressed below:    [5:55 PM] [facility] at [phone], spoke with Earnest Bailey who reported that the PA for Mavyret has been addressed by Dr. Jomarie Longs, patient's hepatologist. Will disregard PA.    Florina Ou, MD  Family Medicine, PGY3  [facility] 10/30 - 09/14/2019

## 2019-09-05 NOTE — Telephone Encounter (Signed)
Per pt she already picked up prescriptions

## 2019-09-05 NOTE — Telephone Encounter (Signed)
Pt's last two most recent MD notes have been [facility]ed [facility] [phone], total of 15 pgs have been faxed

## 2019-09-05 NOTE — Telephone Encounter (Signed)
Melissa from [place]  requesting to get chart notes of this pt. Pls fax to [phone]. Please assist . Thank you

## 2019-09-06 ENCOUNTER — Telehealth: Payer: Self-pay | Admitting: Family Medicine

## 2019-09-06 NOTE — Telephone Encounter (Signed)
[facility] Covering Inbox for Dr. Hulen Luster. Received the following CT report 09/06/19.  I have not personally seen the patient for the diagnoses addressed below:    Last seen by Dr. Hulen Luster on 08/22/2019 for HTN, unintentional weight loss, encounter for screen for lung cancer, chronic hep C, and multiple other complaints. Pt reproted 100lb weight loss over 1 year at visit 07/23/2019 - no diet changes, but was walking more and increased cigarette use from 5 to 15 per day. approx 1.5 PPD since 1980 (60 pack years, approx).    CT Of the Thorax without contrast  Date of exam: 08/27/2019    Report to be scanned to media chart  Impression:  Large hiatal hernia, containing part of the stomach.  There is wall thickening involving the part of the stomach that is herniated, which is nonspecific, and may be exaggerated by nondistention.  Explained diffuse bilateral pulmonary interstitial thickening, more pronounced at the upper lobes, suggestive of some degree of chronic lung disease.  Diverticulosis without diverticulitis.  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    [10:07 AM] Attempted to call the patient at [phone], reached VM, left VM that I will try to call back later and if no response will send result letter.    [10:48 AM] Attempted to call the patient at [phone], reached VM, left VM that I will send result letter, nothing urgent.    Admin Pool: please send letter with results that I wrote 09/06/19. Thanks!    Florina Ou, MD  Family Medicine, PGY3  [facility] 10/30 - 09/14/2019

## 2019-09-18 NOTE — Telephone Encounter (Signed)
Printed letter dated [phone] and mailed it out.      Florina Ou, MD  [facility] Fam Med Admin Pool 12 days ago     Admin Pool: please send letter with results that I wrote 09/06/19. Thanks!

## 2019-09-22 ENCOUNTER — Other Ambulatory Visit: Payer: Self-pay | Admitting: Family Medicine

## 2019-09-22 DIAGNOSIS — F251 Schizoaffective disorder, depressive type: Secondary | ICD-10-CM

## 2019-09-24 MED ORDER — ESCITALOPRAM OXALATE 10 MG OR TABS
ORAL_TABLET | ORAL | 0 refills | Status: DC
Start: 2019-09-24 — End: 2020-01-18

## 2019-10-01 NOTE — Progress Notes (Signed)
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 Patient returns 10/10/19 for follow up  - Has been unable to follow up with Ortho regarding left pinky fracture  - Has been taking ropinirole '8mg'$  daily and wondering if okay to continue at that high dose  - Has phone call with psychiatrist in 2 weeks. Patient is interested in restarting abilify  - Patient also has complaint of dry left nose x1 month. She has been picking at it to get rid of hardened snot. In the past couple weeks, started developing left sided bloody nose    FM [facility] 07/23/2019 09/18/2018 05/22/2018 05/08/2018 05/08/2018 04/14/2018 02/01/2018   PHQ2 Total 0 '6 6 6 '$ 0 6 0   Some recent data might be hidden     Past Medical History:   Diagnosis Date   . Anxiety 2003   . Asthma     Diagnosed as a child   . Bipolar 1 disorder (CMS-HCC) 2003   . 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 Depression 2003   . Restless leg syndrome 2003   . Schizophrenia (CMS-HCC) 07/04/2017   . Stroke (cerebrum) (CMS-HCC)       Past Surgical History:   Procedure Laterality Date   . THROMBOENDARTERECTOMY Right 12/2016    Carotd endarterectomy   . TOTAL ABDOMINAL HYSTERECTOMY W/ BILATERAL SALPINGOOPHORECTOMY  1993      Family History   Problem Relation Name Age of Onset   . Diabetes  Mother          Leg amputation   . Stroke Father  61   . Heart Disease Brother     . Stroke Maternal Grandmother     . Heart Attack Maternal Grandmother        Current Outpatient Medications:   .  albuterol 108 (90 Base) MCG/ACT inhaler, Inhale 2 puffs by mouth every 6 hours as needed for Wheezing., Disp: 1 Inhaler, Rfl: 3  .  amLODIPINE (NORVASC) 5 MG tablet, Take 1 tablet (5 mg) by mouth daily., Disp: 90 tablet, Rfl: 1  .  aspirin 81 MG EC tablet, Take 1 tablet (81 mg) by mouth daily., Disp: 90 tablet, Rfl: 3  .  atorvastatin (LIPITOR) 80 MG tablet, Take 1 tablet (80 mg) by mouth nightly., Disp: 90 tablet, Rfl: 3  .  bisacodyl (DULCOLAX) 5 MG Enteric-Coated tablet, Take 1 tablet (5 mg) by mouth 2 times daily as needed for Constipation., Disp: 60 tablet, Rfl: 3  .  escitalopram (LEXAPRO) 10 MG tablet, TAKE 1 TABLET BY MOUTH EVERY MORNING, Disp: 90 tablet, Rfl: 0  .  glecaprevir-pibrentasvir (MAVYRET) 100-40 MG tablet, Take 3 tablets by mouth daily (with food)., Disp: 90 tablet, Rfl: 0  .  losartan (COZAAR)  50 MG tablet, Take 1.5 tablets (75 mg) by mouth daily., Disp: 135 tablet, Rfl: 3  .  metFORMIN (GLUCOPHAGE) 500 MG tablet, Take 1 tablet (500 mg) by mouth 2 times daily (with meals)., Disp: 180 tablet, Rfl: 3  .  Metoprolol Succinate 25 MG CS24, Take 25 mg by mouth daily., Disp: , Rfl:   .  nicotine (NICODERM CQ) 14 MG/24HR, nicotine 14 mg/24 hr daily transdermal patch, Disp: , Rfl:   .  nitroGLYcerin (NITROSTAT) 0.4 MG SL tablet, 1 tablet (0.4 mg) by Sublingual route every 5 minutes as needed for Chest Pain. up to 3 tabs per episode., Disp: 1 bottle, Rfl: 1  .  QUEtiapine (SEROQUEL) 200 MG tablet, Take 1 tablet (200 mg) by mouth at bedtime., Disp: 90 tablet, Rfl: 0  .  ropinirole (REQUIP) 4 MG tablet, Take 1 tablet (4 mg) by mouth daily., Disp: 90 tablet, Rfl: 3  .  ticagrelor (BRILINTA) 90 MG TABS tablet, Take 90 mg by mouth every 12 hours., Disp: , Rfl:     Allergies:  No Known Allergies    SOCIAL HISTORY:         reports that she has been smoking cigarettes. She started smoking about 41 years ago. She has a 61.50 pack-year smoking history. She has never used smokeless tobacco. She reports that she does not drink alcohol or use drugs.    Review of Systems:  A 10 system ROS was performed and is negative except as above in HPI    OBJECTIVE:     Temperature:  [97.3 F (36.3 C)] 97.3 F (36.3 C) (12/29 0959)  Blood pressure (BP): (106-113)/(48-51) 113/48 (12/29 1002)  Heart Rate:  [56] 56 (12/29 1002)  Respirations:  [18] 18 (12/29 0959)  Pain Score: 0 (12/29 1001)     Physical Exam:  Well developed, in NAD. Conversant, good eye contact. Patient well dressed and in good spirits  HEENT: PERRLA, oropharynx clear with moist mucous membranes, bilateral tympanic membrane normal, no cervical lymphadenopathy, nasal passages clear with dried blood in left nares  Heart: regular rate and rhythm. S1 and S2 intensities normal. Soft 1/6 systolic ejection murmur heard loudest over aortic region   Chest: clear to auscultation bilaterally. No wheezes, rales or rhonchi  Abdomen: soft, nontender, nondistended. No rebound, no guarding.   Extremities: no peripheral edema, left pinky is still swollen with mild blue discoloration and difficulty in flexion  Neuro: 4-/5 strength in LUE with discoordination unchanged from baseline, 5/5 strength in RUE and bilateral lower extremities      Labs and Imaging:    CT Thorax w/o Con (08/27/19)   Large hiatal hernia, containing part of the stomach. There is wall thickening involving the part of the stomach that is herniated, which is nonspecific, and may be exaggerated by non distention.   Diffuse bilateral pulmonary interstitial thickening, more pronounced at the upper lobe, suggestive of some degree of chronic lung disease   Diverticulosis without diverticulitis   Evidence of old granulomatous infection   Aortic and coronary artery atheromatous calcifications    ASSESSMENT/PLAN:     # Essential  hypertension  At goal. Goal blood pressure <140/90. Previous medication trial includes lisinopril which caused cough side effect.  - Continue amlodipine '5mg'$  daily, losartan '75mg'$  daily, metop succ '25mg'$  daily   - Patient unable to check BPs at home. She is unable to coordinate BP cuff with her left arm secondary to weakness s/p stroke  Orders  - losartan (COZAAR) 50 MG tablet;  Take 1.5 tablets (75 mg) by mouth daily.  Dispense: 135 tablet; Refill: 3    # Restless leg syndrome  Patient has been running out of prescriptions early because she is taking '8mg'$  instead of '4mg'$  QHS. I have discussed with her multiple times in the past and she has insisted multiple times to please increase to '8mg'$ . Discussed with patient that it is not safe or recommended to further increase ropinirole past the maximum '4mg'$  recommended dose for restless leg syndrome. Patient expressed understanding. Will provide refill today  Orders  - ropinirole (REQUIP) 4 MG tablet; Take 1 tablet (4 mg) by mouth daily.  Dispense: 90 tablet; Refill: 3    # Dry nose  Patient with dry left nose that is causing her irritation and thickened/hardened nasal discharge. She is traumatizing the area every time she picks at it and is now causing her to occasionally get nose bleeds.  - Advised vaseline to hydrate the left nasal passage  - Advised against picking her nose but if there are hardened nasal discharge, can use nasal saline spray to soften   Orders  - sodium chloride (OCEAN) 0.65 % nasal spray; Use as needed to clean nose  Dispense: 1 bottle; Refill: 3    # Fracture of left pinky, subsequent encounter for fracture  Patient with hx of fall and dislocation of left pinky (~06/2019) and was seen at [facility] ED. Per patient, dislocated again about a week later and was advised by ER doctors to follow up with Orthopedics as she had a torn tendon vs ligament with fracture. Ortho referral was placed 07/23/19. Patient has been unable to attend appointments as she  states she has been given multiple incorrect addresses. Left pinky is still swollen with diminished flexion/extension on exam today.  - Finger splint placed in clinic today  - Will repeat left hand XR to evaluation left pinky for possible callus formation  - Patient states that the Orthopedic office is already aware of her situation. Will try to expedite follow up appointment for her  Orders  - X-Ray Hand 2 Views - Left; Future    # Bilateral carotid artery stenosis s/p R thromboendarterectomy (12/2016)  History of bilateral carotid artery stenosis 12/2017 leading to stroke, s/p R thromboendarterectomy3/2018. Followed by vascular surgery(Dr. Burnard Leigh). Patient has a new vascular surgeon as well (unknown name) as Dr. Elba Barman suggested that left carotid artery stenosis was non-operable. Per patient, she recently underwent left carotid angiogram with new vascular surgeon and has follow up 10/10/19 to discuss whether further surgical intervention is indicated.  - MA to obtain results of angiogram    # Unintentional weight loss  Patient with hx of unintentional 100 lbs weight loss over year 2020. Etiology unknown though ddx includes physiologic, medication induced (previously on wellbutrin x2-3 months), tobacco use (1.5 PPD since 1980). Patient remains asymptomatic and weight is stable at ~160 lbs since 07/23/19. Significant work up includes:   Colonoscopy (09/16/18) with moderate diverticulosis of whole colon, polyps x2, advised to repeat in 5 years   Mammogram (08/14/19) ACR BI-RADS 2, can repeat in 1 year.    Negative CT Chest (08/27/19) for lung cancer   Multiple ESR/CRP, SPEP/UPEP (most recently 07/25/19) unremarkable  At this time, will continue to monitor weights. CT Chest reviewed with patient    # Need for vaccination  - Flu vaccine (6 months +)       Patient seen and discussed with attending, Dr. Jomarie Longs    Follow Up: Return  in about 1 month (around 11/02/2019) for in clinic, follow up with me.

## 2019-10-02 ENCOUNTER — Ambulatory Visit: Payer: No Typology Code available for payment source | Attending: Family Practice | Admitting: Family Medicine

## 2019-10-02 ENCOUNTER — Encounter: Payer: Self-pay | Admitting: Family Medicine

## 2019-10-02 VITALS — BP 113/48 | HR 56 | Temp 97.3°F | Resp 18 | Ht 62.01 in | Wt 161.6 lb

## 2019-10-02 DIAGNOSIS — M84445D Pathological fracture, left finger(s), subsequent encounter for fracture with routine healing: Secondary | ICD-10-CM | POA: Insufficient documentation

## 2019-10-02 DIAGNOSIS — G2581 Restless legs syndrome: Secondary | ICD-10-CM | POA: Insufficient documentation

## 2019-10-02 DIAGNOSIS — I10 Essential (primary) hypertension: Secondary | ICD-10-CM | POA: Insufficient documentation

## 2019-10-02 DIAGNOSIS — R634 Abnormal weight loss: Secondary | ICD-10-CM | POA: Insufficient documentation

## 2019-10-02 DIAGNOSIS — Z6829 Body mass index (BMI) 29.0-29.9, adult: Secondary | ICD-10-CM | POA: Insufficient documentation

## 2019-10-02 DIAGNOSIS — I6523 Occlusion and stenosis of bilateral carotid arteries: Secondary | ICD-10-CM | POA: Insufficient documentation

## 2019-10-02 DIAGNOSIS — J3489 Other specified disorders of nose and nasal sinuses: Secondary | ICD-10-CM | POA: Insufficient documentation

## 2019-10-02 DIAGNOSIS — Z23 Encounter for immunization: Secondary | ICD-10-CM | POA: Insufficient documentation

## 2019-10-02 MED ORDER — SALINE NASAL SPRAY 0.65 % NA SOLN
NASAL | 3 refills | Status: DC
Start: 2019-10-02 — End: 2020-01-18

## 2019-10-02 MED ORDER — ROPINIROLE HCL 4 MG OR TABS
4.0000 mg | ORAL_TABLET | Freq: Every day | ORAL | 3 refills | Status: DC
Start: 2019-10-02 — End: 2020-01-18

## 2019-10-02 MED ORDER — LOSARTAN POTASSIUM 50 MG OR TABS
75.0000 mg | ORAL_TABLET | Freq: Every day | ORAL | 3 refills | Status: DC
Start: 2019-10-02 — End: 2020-01-18

## 2019-10-02 NOTE — Patient Instructions (Addendum)
- Try using vaseline    Provider/Facility:  [facility]  Address: [address] [place] ,[place] [address]  Phone: [phone]  Fax: [phone]      Nosebleed, Adult  A nosebleed is when blood comes out of the nose. Nosebleeds are common. Usually, they are not a sign of a serious condition.  Nosebleeds can happen if a small blood vessel in your nose starts to bleed or if the lining of your nose (mucous membrane) cracks. They are commonly caused by:   Allergies.   Colds.   Picking your nose.   Blowing your nose too hard.   An injury from sticking an object into your nose or getting hit in the nose.   Dry or cold air.  Less common causes of nosebleeds include:   Toxic fumes.   Something abnormal in the nose or in the air-filled spaces in the bones of the face (sinuses).   Growths in the nose, such as polyps.   Medicines or conditions that cause blood to clot slowly.   Certain illnesses or procedures that irritate or dry out the nasal passages.  Follow these instructions at home:  When you have a nosebleed:     Sit down and tilt your head slightly forward.   Use a clean towel or tissue to pinch your nostrils under the bony part of your nose. After 10 minutes, let go of your nose and see if bleeding starts again. Do not release pressure before that time. If there is still bleeding, repeat the pinching and holding for 10 minutes until the bleeding stops.   Do not place tissues or gauze in the nose to stop bleeding.   Avoid lying down and avoid tilting your head backward. That may make blood collect in the throat and cause gagging or coughing.   Use a nasal spray decongestant to help with a nosebleed as told by your health care provider.   Do not use petroleum jelly or mineral oil in your nose. It can drip into your lungs.  After a nosebleed:   Avoid blowing your nose or sniffing for a number of hours.   Avoid straining, lifting, or bending at the waist for several days. You may  resume other normal activities as you are able.   Use saline spray or a humidifier as told by your health care provider.   Aspirinand blood thinners make bleeding more likely. If you are prescribed these medicines and you suffer from nosebleeds:  ? Ask your health care provider if you should stop taking the medicines or if you should adjust the dose.  ? Do not stop taking medicines that your health care provider has recommended unless told by your health care provider.   If your nosebleed was caused by dry mucous membranes, use over-the-counter saline nasal spray or gel. This will keep the mucous membranes moist and allow them to heal. If you must use a lubricant:  ? Choose one that is water-soluble.  ? Use only as much as you need and use it only as often as needed.  ? Do not lie down until several hours after you use it.  Contact a health care provider if:   You have a fever.   You get nosebleeds often or more often than usual.   You bruise very easily.   You have a nosebleed from having something stuck in your nose.   You have bleeding in your mouth.   You vomit or cough up  brown material.   You have a nosebleed after you start a new medicine.  Get help right away if:   You have a nosebleed after a fall or a head injury.   Your nosebleed does not go away after 20 minutes.   You feel dizzy or weak.   You have unusual bleeding from other parts of your body.   You have unusual bruising on other parts of your body.   You become sweaty.   You vomit blood.  This information is not intended to replace advice given to you by your health care provider. Make sure you discuss any questions you have with your health care provider.  Document Released: 06/30/2005 Document Revised: 01/25/2017 Document Reviewed: 04/06/2016  Elsevier Interactive Patient Education  2019 [employer].

## 2019-10-02 NOTE — Progress Notes (Signed)
Attending attestation: I discussed the case with the resident and agree with the findings and plan as documented by the resident.  My additions or revisions are included in the record and the plan of care was documented in this note.  I reviewed and signed the resident's note after discussion and additional considerations that now reflect my findings and treatment plan.  Aubrina Nieman Bui, DO

## 2019-10-29 NOTE — Progress Notes (Deleted)
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      ***    FM PHQ2 07/23/2019 09/18/2018 05/22/2018 05/08/2018 05/08/2018 04/14/2018 02/01/2018   PHQ2 Total 0 '6 6 6 '$ 0 6 0   Some recent data might be hidden     Past Medical History:   Diagnosis Date   . Anxiety 2003   . Asthma     Diagnosed as a child   . Bipolar 1 disorder (CMS-HCC) 2003   . 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 Depression 2003   . Restless leg syndrome 2003   . Schizophrenia (CMS-HCC) 07/04/2017   . Stroke (cerebrum) (CMS-HCC)         Past Surgical History:   Procedure Laterality Date   . THROMBOENDARTERECTOMY Right 12/2016    Carotd endarterectomy   . TOTAL ABDOMINAL HYSTERECTOMY W/ BILATERAL SALPINGOOPHORECTOMY  1993        Family History   Problem Relation Name Age of Onset   . Diabetes Mother          Leg amputation   . Stroke Father  55   . Heart Disease Brother     . Stroke Maternal Grandmother     . Heart Attack Maternal Grandmother            Current Outpatient Medications:   .  albuterol 108 (90 Base) MCG/ACT inhaler, Inhale 2 puffs by mouth every 6 hours as needed for Wheezing., Disp: 1 Inhaler, Rfl: 3  .  amLODIPINE (NORVASC) 5 MG tablet, Take 1 tablet (5 mg) by mouth daily., Disp: 90 tablet, Rfl: 1  .  aspirin 81 MG EC tablet, Take 1 tablet (81 mg) by mouth daily., Disp: 90 tablet, Rfl: 3  .  atorvastatin (LIPITOR) 80 MG  tablet, Take 1 tablet (80 mg) by mouth nightly., Disp: 90 tablet, Rfl: 3  .  bisacodyl (DULCOLAX) 5 MG Enteric-Coated tablet, Take 1 tablet (5 mg) by mouth 2 times daily as needed for Constipation., Disp: 60 tablet, Rfl: 3  .  Blood Glucose Monitoring Suppl (TRUE METRIX METER) w/Device KIT, Check blood sugar daily as directed., Disp: 1 kit, Rfl: 0  .  escitalopram (LEXAPRO) 10 MG tablet, TAKE 1 TABLET BY MOUTH EVERY MORNING, Disp: 90 tablet, Rfl: 0  .  glecaprevir-pibrentasvir (MAVYRET) 100-40 MG tablet, Take 3 tablets by mouth daily (with food)., Disp: 90 tablet, Rfl: 0  .  glucose blood test strip, 1 strip by Other route at bedtime., Disp: 100 strip, Rfl: 5  .  lancets, 1 each by Other route at bedtime., Disp: 100 Lancet, Rfl: 5  .  losartan (COZAAR) 50 MG tablet, Take 1.5 tablets (75 mg) by mouth daily., Disp: 135 tablet, Rfl: 3  .  metFORMIN (GLUCOPHAGE) 500 MG tablet, Take 1 tablet (500 mg) by mouth 2 times daily (  with meals)., Disp: 180 tablet, Rfl: 3  .  Metoprolol Succinate 25 MG CS24, Take 25 mg by mouth daily., Disp: , Rfl:   .  nicotine (NICODERM CQ) 14 MG/24HR, nicotine 14 mg/24 hr daily transdermal patch, Disp: , Rfl:   .  nitroGLYcerin (NITROSTAT) 0.4 MG SL tablet, 1 tablet (0.4 mg) by Sublingual route every 5 minutes as needed for Chest Pain. up to 3 tabs per episode., Disp: 1 bottle, Rfl: 1  .  QUEtiapine (SEROQUEL) 200 MG tablet, Take 1 tablet (200 mg) by mouth at bedtime., Disp: 90 tablet, Rfl: 0  .  ropinirole (REQUIP) 4 MG tablet, Take 1 tablet (4 mg) by mouth daily., Disp: 90 tablet, Rfl: 3  .  sodium chloride (OCEAN) 0.65 % nasal spray, Use as needed to clean nose, Disp: 1 bottle, Rfl: 3  .  ticagrelor (BRILINTA) 90 MG TABS tablet, Take 90 mg by mouth every 12 hours., Disp: , Rfl:     Allergies:  No Known Allergies    SOCIAL HISTORY:      reports that she has been smoking cigarettes. She started smoking about 41 years ago. She has a 61.50 pack-year smoking history. She has never used smokeless  tobacco. She reports that she does not drink alcohol or use drugs.    Review of Systems:  A 10 system ROS was performed and is negative except as above in HPI    OBJECTIVE:     Blood pressure (BP): ()/()      Physical Exam:  ***  Well developed, in NAD. Conversant, good eye contact.  Heart: regular rate and rhythm. S1 and S2 intensities normal. No murmurs, rubs or gallops.   Chest: clear to auscultation bilaterally. No wheezes, rales or rhonchi  Abdomen: soft, nontender, nondistended. No rebound, no guarding.   Extremities: no peripheral edema     Labs and Imaging:  ***    The ASCVD Risk score Mikey Bussing DC Jr., et al., 2013) failed to calculate for the following reasons:    The patient has a prior MI or stroke diagnosis    ASSESSMENT/PLAN:     ***    Patient {bksdiscussedwithattending:[id number]} with attending, Dr. Marland Kitchen    Follow Up: No follow-ups on file.

## 2019-10-30 ENCOUNTER — Ambulatory Visit: Payer: No Typology Code available for payment source | Admitting: Family Medicine

## 2019-11-01 ENCOUNTER — Telehealth: Payer: Self-pay | Admitting: Family Medicine

## 2019-11-01 NOTE — Telephone Encounter (Addendum)
Patient is requesting in person appointment at end of February with dr Danella Penton only please assist thank you

## 2019-11-02 NOTE — Telephone Encounter (Signed)
Pt agreed with new apt in march

## 2019-12-03 NOTE — Progress Notes (Deleted)
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    # Interval Update  ***      FM PHQ2 07/23/2019 09/18/2018 05/22/2018 05/08/2018 05/08/2018 04/14/2018 02/01/2018   PHQ2 Total 0 '6 6 6 '$ 0 6 0   Some recent data might be hidden     Past Medical History:   Diagnosis Date   . Anxiety 2003   . Asthma     Diagnosed as a child   . Bipolar 1 disorder (CMS-HCC) 2003   . 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 Depression 2003   . Restless leg syndrome 2003   . Schizophrenia (CMS-HCC) 07/04/2017   . Stroke (cerebrum) (CMS-HCC)         Past Surgical History:   Procedure Laterality Date   . THROMBOENDARTERECTOMY Right 12/2016    Carotd endarterectomy   . TOTAL ABDOMINAL HYSTERECTOMY W/ BILATERAL SALPINGOOPHORECTOMY  1993        Family History   Problem Relation Name Age of Onset   . Diabetes Mother          Leg amputation   . Stroke Father  43   . Heart Disease Brother     . Stroke Maternal Grandmother     . Heart Attack Maternal Grandmother            Current Outpatient Medications:   .  albuterol 108 (90 Base) MCG/ACT inhaler, Inhale 2 puffs by mouth every 6 hours as needed for Wheezing., Disp: 1 Inhaler, Rfl: 3  .  amLODIPINE (NORVASC) 5 MG tablet, Take 1 tablet (5 mg) by mouth daily., Disp: 90 tablet, Rfl: 1  .  aspirin 81 MG EC tablet, Take 1 tablet (81 mg) by mouth daily., Disp: 90 tablet, Rfl: 3  .   atorvastatin (LIPITOR) 80 MG tablet, Take 1 tablet (80 mg) by mouth nightly., Disp: 90 tablet, Rfl: 3  .  bisacodyl (DULCOLAX) 5 MG Enteric-Coated tablet, Take 1 tablet (5 mg) by mouth 2 times daily as needed for Constipation., Disp: 60 tablet, Rfl: 3  .  Blood Glucose Monitoring Suppl (TRUE METRIX METER) w/Device KIT, Check blood sugar daily as directed., Disp: 1 kit, Rfl: 0  .  escitalopram (LEXAPRO) 10 MG tablet, TAKE 1 TABLET BY MOUTH EVERY MORNING, Disp: 90 tablet, Rfl: 0  .  glecaprevir-pibrentasvir (MAVYRET) 100-40 MG tablet, Take 3 tablets by mouth daily (with food)., Disp: 90 tablet, Rfl: 0  .  glucose blood test strip, 1 strip by Other route at bedtime., Disp: 100 strip, Rfl: 5  .  lancets, 1 each by Other route at bedtime., Disp: 100 Lancet, Rfl: 5  .  losartan (COZAAR) 50 MG tablet, Take 1.5 tablets (75 mg) by mouth daily., Disp: 135 tablet, Rfl: 3  .  metFORMIN (GLUCOPHAGE) 500 MG tablet, Take 1 tablet (500 mg) by  mouth 2 times daily (with meals)., Disp: 180 tablet, Rfl: 3  .  Metoprolol Succinate 25 MG CS24, Take 25 mg by mouth daily., Disp: , Rfl:   .  nicotine (NICODERM CQ) 14 MG/24HR, nicotine 14 mg/24 hr daily transdermal patch, Disp: , Rfl:   .  nitroGLYcerin (NITROSTAT) 0.4 MG SL tablet, 1 tablet (0.4 mg) by Sublingual route every 5 minutes as needed for Chest Pain. up to 3 tabs per episode., Disp: 1 bottle, Rfl: 1  .  QUEtiapine (SEROQUEL) 200 MG tablet, Take 1 tablet (200 mg) by mouth at bedtime., Disp: 90 tablet, Rfl: 0  .  ropinirole (REQUIP) 4 MG tablet, Take 1 tablet (4 mg) by mouth daily., Disp: 90 tablet, Rfl: 3  .  sodium chloride (OCEAN) 0.65 % nasal spray, Use as needed to clean nose, Disp: 1 bottle, Rfl: 3  .  ticagrelor (BRILINTA) 90 MG TABS tablet, Take 90 mg by mouth every 12 hours., Disp: , Rfl:     Allergies:  No Known Allergies    SOCIAL HISTORY:      reports that she has been smoking cigarettes. She started smoking about 41 years ago. She has a 61.50 pack-year smoking history.  She has never used smokeless tobacco. She reports that she does not drink alcohol or use drugs.    Review of Systems:  A 10 system ROS was performed and is negative except as above in HPI    OBJECTIVE:     Blood pressure (BP): ()/()      Physical Exam:  ***  Well developed, in NAD. Conversant, good eye contact.  Heart: regular rate and rhythm. S1 and S2 intensities normal. No murmurs, rubs or gallops.   Chest: clear to auscultation bilaterally. No wheezes, rales or rhonchi  Abdomen: soft, nontender, nondistended. No rebound, no guarding.   Extremities: no peripheral edema     Labs and Imaging:  ***    The ASCVD Risk score Mikey Bussing DC Jr., et al., 2013) failed to calculate for the following reasons:    The patient has a prior MI or stroke diagnosis    ASSESSMENT/PLAN:     ***    Patient {bksdiscussedwithattending:[id number]} with attending, Dr. Marland Kitchen    Follow Up: No follow-ups on file.

## 2019-12-04 ENCOUNTER — Ambulatory Visit: Payer: No Typology Code available for payment source | Admitting: Family Medicine

## 2019-12-05 ENCOUNTER — Telehealth: Payer: Self-pay | Admitting: Family Medicine

## 2019-12-05 NOTE — Telephone Encounter (Signed)
Patient is requesting a sooner appointment for follow up on her chronic conditions. 1st available scheduled for 03/20/2020.

## 2019-12-07 ENCOUNTER — Telehealth: Payer: Self-pay | Admitting: Family Medicine

## 2019-12-07 DIAGNOSIS — G2581 Restless legs syndrome: Secondary | ICD-10-CM

## 2019-12-07 NOTE — Telephone Encounter (Signed)
Patient requesting refill on ropinirole (REQUIP) 4 MG tablet. Patient advice no medication left please assist thank you    Patient returning call from [facility]   Please assist thank you

## 2019-12-07 NOTE — Telephone Encounter (Signed)
Pt has refills   Called pt to informed her n/a LVM

## 2019-12-07 NOTE — Telephone Encounter (Signed)
LVM PATIENT NEEDS A SOONER APT WITH DR Danella Penton

## 2019-12-20 ENCOUNTER — Telehealth: Payer: Self-pay | Admitting: Family Medicine

## 2019-12-20 DIAGNOSIS — I63233 Cerebral infarction due to unspecified occlusion or stenosis of bilateral carotid arteries: Secondary | ICD-10-CM

## 2019-12-20 NOTE — Telephone Encounter (Signed)
Please, call center can you help patient to schedule an apt if patient calls back because I keep calling and no luck. Thank you

## 2019-12-20 NOTE — Telephone Encounter (Signed)
Patient requesting refill of the following medication:     aspirin 81 MG EC tablet    Please follow up.

## 2019-12-20 NOTE — Telephone Encounter (Signed)
2nd attempt    Patient requesting sooner appointment, please follow up.

## 2019-12-21 MED ORDER — ASPIRIN 81 MG OR TBEC
81.0000 mg | DELAYED_RELEASE_TABLET | Freq: Every day | ORAL | 3 refills | Status: DC
Start: 2019-12-21 — End: 2020-01-18

## 2019-12-21 NOTE — Telephone Encounter (Signed)
Patient is following up on previus message is requesting a sooner app please assist thank you

## 2019-12-21 NOTE — Telephone Encounter (Signed)
2nd call see previus message please assist thank you.

## 2019-12-21 NOTE — Telephone Encounter (Signed)
Second request message forward to jmed

## 2019-12-26 ENCOUNTER — Telehealth: Payer: Self-pay | Admitting: Family Medicine

## 2019-12-26 NOTE — Telephone Encounter (Signed)
Patient is scheduled for an appointment on 06/17, however patient is requesting a sooner appointment. Patient states June is too far for her.     Please assist.

## 2019-12-26 NOTE — Telephone Encounter (Signed)
Patient is requesting a urgent refill on medication aspirin 81 MG EC tablet.     Please assist.

## 2019-12-27 NOTE — Telephone Encounter (Signed)
patient has refills for aspirin   Called pt to informed her n/a LVM

## 2019-12-27 NOTE — Telephone Encounter (Signed)
LVM PLEASE ASSIST PT TO SCHEDULE AN APT SOONER THAN June

## 2019-12-27 NOTE — Telephone Encounter (Signed)
2nd call  Patient is scheduled for an appointment on 06/17 but is requesting a sooner appointment.     Please assist.

## 2019-12-28 NOTE — Telephone Encounter (Signed)
Please call center can you help patient to schedule an apt if she calls back, me and the patient are going [place] and [place]. Maybe if the patient wants to schedule with another provider in red team or telemed with dr Danella Penton no appointment in person at this moment with dr Danella Penton    Thank you

## 2020-01-03 ENCOUNTER — Ambulatory Visit: Payer: No Typology Code available for payment source | Admitting: Family Medicine

## 2020-01-03 NOTE — Progress Notes (Signed)
This encounter was opened in error.  Please disregard.

## 2020-01-15 ENCOUNTER — Telehealth: Payer: Self-pay | Admitting: Family Medicine

## 2020-01-15 NOTE — Telephone Encounter (Signed)
Patient and her case manager Everardo All scheduled an appointment on 04/30 via telemedicine for a follow up and a medication refill. Patient states provider needs to see patient in order for new refills, please assist with a sooner appointment.     Thank You

## 2020-01-16 NOTE — Telephone Encounter (Signed)
lvm explaining pt there Is no sooner apt with dr Danella Penton unless patient is okay to schedule with another pcp in the same group

## 2020-01-18 ENCOUNTER — Telehealth: Payer: Self-pay | Admitting: Family Medicine

## 2020-01-18 ENCOUNTER — Ambulatory Visit: Payer: No Typology Code available for payment source | Attending: Nephrology

## 2020-01-18 VITALS — BP 123/58 | HR 59 | Temp 96.9°F | Resp 17 | Ht 62.0 in | Wt 161.6 lb

## 2020-01-18 DIAGNOSIS — I63233 Cerebral infarction due to unspecified occlusion or stenosis of bilateral carotid arteries: Secondary | ICD-10-CM | POA: Insufficient documentation

## 2020-01-18 DIAGNOSIS — J449 Chronic obstructive pulmonary disease, unspecified: Secondary | ICD-10-CM | POA: Insufficient documentation

## 2020-01-18 DIAGNOSIS — G2581 Restless legs syndrome: Secondary | ICD-10-CM | POA: Insufficient documentation

## 2020-01-18 DIAGNOSIS — R0789 Other chest pain: Secondary | ICD-10-CM | POA: Insufficient documentation

## 2020-01-18 DIAGNOSIS — Z6829 Body mass index (BMI) 29.0-29.9, adult: Secondary | ICD-10-CM | POA: Insufficient documentation

## 2020-01-18 DIAGNOSIS — E1142 Type 2 diabetes mellitus with diabetic polyneuropathy: Secondary | ICD-10-CM | POA: Insufficient documentation

## 2020-01-18 DIAGNOSIS — I10 Essential (primary) hypertension: Secondary | ICD-10-CM | POA: Insufficient documentation

## 2020-01-18 MED ORDER — NITROGLYCERIN 0.4 MG SL SUBL
0.4000 mg | SUBLINGUAL_TABLET | SUBLINGUAL | 1 refills | Status: AC | PRN
Start: 2020-01-18 — End: ?

## 2020-01-18 MED ORDER — PREDNISOLONE ACETATE 1 % OP SUSP
OPHTHALMIC | Status: DC
Start: 2019-10-29 — End: 2020-01-18

## 2020-01-18 MED ORDER — NORTRIPTYLINE HCL 10 MG OR CAPS
ORAL_CAPSULE | ORAL | Status: DC
Start: 2019-11-23 — End: 2020-01-18

## 2020-01-18 MED ORDER — ROPINIROLE HCL 4 MG OR TABS
4.0000 mg | ORAL_TABLET | Freq: Every day | ORAL | 0 refills | Status: DC
Start: 2020-01-18 — End: 2020-02-01

## 2020-01-18 MED ORDER — ASPIRIN 81 MG OR TBEC
81.0000 mg | DELAYED_RELEASE_TABLET | Freq: Every day | ORAL | 0 refills | Status: AC
Start: 2020-01-18 — End: ?

## 2020-01-18 MED ORDER — METOPROLOL SUCCINATE 25 MG PO CS24
25.0000 mg | EXTENDED_RELEASE_CAPSULE | Freq: Every day | ORAL | 0 refills | Status: DC
Start: 2020-01-18 — End: 2020-03-20

## 2020-01-18 MED ORDER — ARIPIPRAZOLE 5 MG OR TABS
5.0000 mg | ORAL_TABLET | Freq: Every day | ORAL | Status: DC
Start: ? — End: 2020-03-20

## 2020-01-18 MED ORDER — METFORMIN HCL 500 MG OR TABS
500.0000 mg | ORAL_TABLET | Freq: Two times a day (BID) | ORAL | 0 refills | Status: AC
Start: 2020-01-18 — End: ?

## 2020-01-18 MED ORDER — TRAZODONE HCL 50 MG OR TABS
ORAL_TABLET | ORAL | Status: DC
Start: 2020-01-15 — End: 2020-03-20

## 2020-01-18 MED ORDER — CHANTIX STARTING MONTH PAK 0.5 MG X 11 & 1 MG X 42 PO TABS
ORAL_TABLET | ORAL | Status: DC
Start: 2020-01-09 — End: 2020-02-01

## 2020-01-18 MED ORDER — LISINOPRIL 40 MG OR TABS
40.0000 mg | ORAL_TABLET | Freq: Every day | ORAL | Status: DC
Start: 2019-12-24 — End: 2020-01-18

## 2020-01-18 MED ORDER — LISINOPRIL 40 MG OR TABS
40.0000 mg | ORAL_TABLET | Freq: Every day | ORAL | 0 refills | Status: DC
Start: 2020-01-18 — End: 2020-02-27

## 2020-01-18 MED ORDER — AMLODIPINE 10 MG OR TABS
10.0000 mg | ORAL_TABLET | Freq: Every day | ORAL | 0 refills | Status: DC
Start: 2020-01-18 — End: 2020-03-20

## 2020-01-18 MED ORDER — KETOROLAC TROMETHAMINE 0.5 % OP SOLN
OPHTHALMIC | Status: DC
Start: 2019-10-29 — End: 2020-01-18

## 2020-01-18 MED ORDER — TICAGRELOR 90 MG PO TABS
90.0000 mg | ORAL_TABLET | Freq: Two times a day (BID) | ORAL | 0 refills | Status: AC
Start: 2020-01-18 — End: ?

## 2020-01-18 MED ORDER — SERTRALINE HCL 25 MG OR TABS
ORAL_TABLET | ORAL | Status: DC
Start: 2020-01-15 — End: 2020-03-20

## 2020-01-18 MED ORDER — AMLODIPINE 10 MG OR TABS
10.0000 mg | ORAL_TABLET | Freq: Every day | ORAL | Status: DC
Start: 2020-01-09 — End: 2020-01-18

## 2020-01-18 MED ORDER — ALBUTEROL SULFATE 108 (90 BASE) MCG/ACT IN AERS
2.0000 | INHALATION_SPRAY | Freq: Four times a day (QID) | RESPIRATORY_TRACT | 3 refills | Status: DC | PRN
Start: 2020-01-18 — End: 2020-03-20

## 2020-01-18 MED ORDER — ATORVASTATIN CALCIUM 80 MG OR TABS
80.0000 mg | ORAL_TABLET | Freq: Every evening | ORAL | 0 refills | Status: DC
Start: 2020-01-18 — End: 2020-03-20

## 2020-01-18 NOTE — Telephone Encounter (Signed)
PT REQUESTING COVID-VACCINE.

## 2020-01-18 NOTE — Progress Notes (Signed)
HPI: Regina Ward is a 59 year old female with PMHs of multiple comorbidity (see problem list):  Pt here for med refills.   She went to her sister's wedding last week and stayed at a hotel.   She forgot all her medications there.   She's here at the walk-in clinic because her next appt is not until the end of month.  Currently has not taken meds for 1 week.   Significant hx for depression. Denied suicidal/homicidal ideation.   Talked to Almyra Free, case manager for her psychiatry condition.   Per Almyra Free, all the psych meds are managed and will be refilled by her psychiatrist. Pt sees psychiatrist frequently.   She also lives at an assisted living.   Pt remembers about 60-70% of all the medications she takes.   There are some duplications of medications in med list, such as Amlodipine 5 mg vs 10 mg; and Lisinopril vs Losartan.   Per patient, her BP is managed by both PCP and cardiologist. Pt endorses taking Amlodipine 10 mg daily and Lisinopril 40 mg daily. Also, the start date of these two medications are more recent.   Pt denied chest pain/SOB/fever/palpitation.     Brief Health Screen 07/23/2019 10/24/2018 09/18/2018 08/17/2018   WOMEN: How many times in the past year have you had 4 or more drinks in a day? None None None None   How many times in the past year have you used a recreational drug or used a prescription medication for non-medical reasons? None None None None        Past Medical History  Patient Active Problem List   Diagnosis   . Insomnia due to other mental disorder   . Bilateral carotid artery stenosis s/p R thromboendarterectomy (12/2016)   . COPD with asthma (CMS-HCC)   . Schizoaffective disorder, depressive type (CMS-HCC)   . Restless leg syndrome   . Posttraumatic stress disorder   . Type 2 diabetes mellitus with diabetic polyneuropathy, without long-term current use of insulin (CMS-HCC)   . OSA (obstructive sleep apnea)   . Tobacco use disorder   . Chronic hepatitis C without hepatic coma (CMS-HCC)    . Essential hypertension   . Other chest pain   . 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 Intractable migraine without aura and without status migrainosus   . History of heart artery stent - 07/2019   . Unintentional weight loss   . Fracture of left pinky, subsequent encounter for fracture       Surgical History  Past Surgical History:   Procedure Laterality Date   . THROMBOENDARTERECTOMY Right 12/2016    Carotd endarterectomy   . TOTAL ABDOMINAL HYSTERECTOMY W/ BILATERAL SALPINGOOPHORECTOMY  1993       Family History  Family History   Problem Relation Name Age of Onset   . Diabetes Mother          Leg amputation   . Stroke Father  67   . Heart Disease Brother     . Stroke Maternal Grandmother     . Heart Attack Maternal Grandmother         Social History  Social History     Occupational History   . Not on file   Tobacco Use   . Smoking status: Current Every Day Smoker     Packs/day: 1.50     Years: 41.00     Pack years: 61.50  Types: Cigarettes     Start date: 22   . Smokeless tobacco: Never Used   Substance and Sexual Activity   . Alcohol use: No   . Drug use: No     Comment: ex drug use 2016 - cocaine   . Sexual activity: Never     Comment: Not sexually active in 3 years       Medications    Current Outpatient Medications:   .  albuterol 108 (90 Base) MCG/ACT inhaler, Inhale 2 puffs by mouth every 6 hours as needed for Wheezing., Disp: 1 Inhaler, Rfl: 3  .  amLODIPINE (NORVASC) 10 MG tablet, Take 1 tablet (10 mg) by mouth daily., Disp: 30 tablet, Rfl: 0  .  ARIPiprazole (ABILIFY) 5 MG tablet, Take 5 mg by mouth daily., Disp: , Rfl:   .  aspirin 81 MG EC tablet, Take 1 tablet (81 mg) by mouth daily., Disp: 30 tablet, Rfl: 0  .  atorvastatin (LIPITOR) 80 MG tablet, Take 1 tablet (80 mg) by mouth nightly., Disp: 30 tablet, Rfl: 0  .  bisacodyl (DULCOLAX) 5 MG Enteric-Coated tablet, Take 1 tablet (5 mg) by mouth 2 times daily as needed for Constipation., Disp: 60  tablet, Rfl: 3  .  Blood Glucose Monitoring Suppl (TRUE METRIX METER) w/Device KIT, Check blood sugar daily as directed., Disp: 1 kit, Rfl: 0  .  CHANTIX STARTING MONTH PAK 0.5 MG X 11 & 1 MG X 42 tablet, TAKE 1 TABLET BY MOUTH EVERY DAY AS DIRECTED, Disp: , Rfl:   .  glucose blood test strip, 1 strip by Other route at bedtime., Disp: 100 strip, Rfl: 5  .  lancets, 1 each by Other route at bedtime., Disp: 100 Lancet, Rfl: 5  .  lisinopril (PRINIVIL, ZESTRIL) 40 MG tablet, Take 1 tablet (40 mg) by mouth daily., Disp: 30 tablet, Rfl: 0  .  metFORMIN (GLUCOPHAGE) 500 MG tablet, Take 1 tablet (500 mg) by mouth 2 times daily (with meals)., Disp: 60 tablet, Rfl: 0  .  Metoprolol Succinate 25 MG CS24, Take 25 mg by mouth daily., Disp: 30 capsule, Rfl: 0  .  nitroGLYcerin (NITROSTAT) 0.4 MG SL tablet, 1 tablet (0.4 mg) by Sublingual route every 5 minutes as needed for Chest Pain. up to 3 tabs per episode., Disp: 1 bottle, Rfl: 1  .  ropinirole (REQUIP) 4 MG tablet, Take 1 tablet (4 mg) by mouth daily., Disp: 30 tablet, Rfl: 0  .  sertraline (ZOLOFT) 25 MG tablet, TAKE 1 TABLET BY MOUTH EVERY DAY IN THE MORNING, Disp: , Rfl:   .  ticagrelor (BRILINTA) 90 MG TABS tablet, Take 1 tablet (90 mg) by mouth every 12 hours., Disp: 60 tablet, Rfl: 0  .  traZODone (DESYREL) 50 MG tablet, , Disp: , Rfl:     Allergies  No Known Allergies    ROS  Review of Systems   Constitutional: Negative for appetite change, chills, fatigue, fever and unexpected weight change.   HENT: Negative for congestion, ear discharge, ear pain, sinus pain and sore throat.    Eyes: Negative for photophobia, pain and visual disturbance.   Respiratory: Negative for cough, chest tightness, shortness of breath, wheezing and stridor.    Cardiovascular: Negative for chest pain, palpitations and leg swelling.   Gastrointestinal: Negative for abdominal pain, constipation, diarrhea, nausea and vomiting.   Genitourinary: Negative for dysuria, flank pain and hematuria.      Musculoskeletal: Negative for arthralgias and back pain.   Skin:  Negative for color change.   Neurological: Negative for dizziness, tremors, seizures, syncope, facial asymmetry, speech difficulty, numbness and headaches.   Psychiatric/Behavioral: Negative for agitation, behavioral problems, confusion, decreased concentration, dysphoric mood, hallucinations, self-injury, sleep disturbance and suicidal ideas. The patient is not nervous/anxious and is not hyperactive.        Vitals  Vitals:    01/18/20 0816   BP: 123/58   BP Location: Left arm   BP Patient Position: Sitting   BP cuff size: Regular   Pulse: 59   Resp: 17   Temp: 96.9 F (36.1 C)   TempSrc: Temporal   SpO2: 100%   Weight: 73.3 kg (161 lb 9.6 oz)   Height: '5\' 2"'$  (1.575 m)     Body mass index is 29.56 kg/m.    Physical Exam  Physical Exam  Constitutional:       General: She is not in acute distress.     Appearance: Normal appearance.   Cardiovascular:      Rate and Rhythm: Normal rate and regular rhythm.      Heart sounds: Normal heart sounds.   Pulmonary:      Effort: Pulmonary effort is normal.      Breath sounds: Normal breath sounds.   Neurological:      General: No focal deficit present.      Mental Status: She is alert and oriented to person, place, and time.   Psychiatric:         Attention and Perception: Attention and perception normal. She does not perceive auditory or visual hallucinations.         Mood and Affect: Mood and affect normal. Mood is not anxious. Affect is not flat, angry, tearful or inappropriate.         Speech: Speech normal. She is communicative. Speech is not slurred.         Behavior: Behavior normal.         Thought Content: Thought content normal. Thought content does not include homicidal or suicidal ideation. Thought content does not include homicidal or suicidal plan.         Cognition and Memory: Cognition and memory normal.         Judgment: Judgment normal.         Labs  Lab Results   Component Value Date    A1C  5.3 07/11/2019    A1C 6.1 (H) 03/19/2019    A1C 6.4 (H) 07/10/2018     Lab Results   Component Value Date    CREAT 0.9 07/11/2019     Lab Results   Component Value Date    SODIUM 140 07/11/2019    K 4.9 07/11/2019    CL 107 07/11/2019    CO2 27 07/11/2019    BUN 16 07/11/2019    CREAT 0.9 07/11/2019    GLU 103 07/11/2019    [place] 10.0 07/11/2019         Assessment:      Assessment:  1. Essential hypertension  - amLODIPINE (NORVASC) 10 MG tablet; Take 1 tablet (10 mg) by mouth daily.  Dispense: 30 tablet; Refill: 0  - lisinopril (PRINIVIL, ZESTRIL) 40 MG tablet; Take 1 tablet (40 mg) by mouth daily.  Dispense: 30 tablet; Refill: 0  - Metoprolol Succinate 25 MG CS24; Take 25 mg by mouth daily.  Dispense: 30 capsule; Refill: 0    2. COPD with asthma (CMS-HCC)  - albuterol 108 (90 Base) MCG/ACT inhaler; Inhale 2 puffs by mouth every  6 hours as needed for Wheezing.  Dispense: 1 Inhaler; Refill: 3    3. Cerebrovascular accident (CVA) due to bilateral stenosis of carotid arteries (CMS-HCC)  - aspirin 81 MG EC tablet; Take 1 tablet (81 mg) by mouth daily.  Dispense: 30 tablet; Refill: 0  - atorvastatin (LIPITOR) 80 MG tablet; Take 1 tablet (80 mg) by mouth nightly.  Dispense: 30 tablet; Refill: 0  - ticagrelor (BRILINTA) 90 MG TABS tablet; Take 1 tablet (90 mg) by mouth every 12 hours.  Dispense: 60 tablet; Refill: 0    4. Type 2 diabetes mellitus with diabetic polyneuropathy, without long-term current use of insulin (CMS-HCC)  - metFORMIN (GLUCOPHAGE) 500 MG tablet; Take 1 tablet (500 mg) by mouth 2 times daily (with meals).  Dispense: 60 tablet; Refill: 0    5. Other chest pain  - nitroGLYcerin (NITROSTAT) 0.4 MG SL tablet; 1 tablet (0.4 mg) by Sublingual route every 5 minutes as needed for Chest Pain. up to 3 tabs per episode.  Dispense: 1 bottle; Refill: 1    6. Restless leg syndrome  - ropinirole (REQUIP) 4 MG tablet; Take 1 tablet (4 mg) by mouth daily.  Dispense: 30 tablet; Refill: 0    Refill meds for 30 days  Has a  telephone appt with PCP on 4/30 for more refills.   Will see psychiatrist for psych med refills.      Red flags extensively reviewed especially with any new associated symptoms or worsening pain.   Strict RTC/EMS precautions provided.    #BMI: Body mass index is 29.56 kg/m.   Healthy Diet   Exercise - 30 minutes walking at least 3 times weekly   Discussed BMI with patient.     Patient was given strict return precautions, such as to call 911/EMS should symptoms worsen or fail to improve.    Electronically signed by:    Jori Moll. Soelimto, NP  [place] [facility]    Portions of this chart may have been created with M-Modal voice recognition software. Occasional wrong-word or "sound-alike" substitutions may have occurred due to the inherent limitations of voice recognition software. Please read the chart carefully and recognize, using context, where these substitutions have occurred

## 2020-01-18 NOTE — Patient Instructions (Signed)
Please pick up your medications at the pharmacy.  I refilled your medications for 30 days. Please follow up with your primary doctor for more refills.    Please return to clinic or go to the nearest hospital/call emergency service if you have high fever, severe nausea/vomiting, uncontrollable headache, chest pain or difficulty breathing.

## 2020-01-23 ENCOUNTER — Telehealth: Payer: Self-pay | Admitting: Family Medicine

## 2020-01-23 NOTE — Telephone Encounter (Signed)
I attempted to call patient multiple times 01/22/20 and left voice messages to discuss with patient. I received her IHSS forms and I want to complete it as accurately as possible.    I attempted to call patient today 01/23/20 as well to go over forms but her phone number is no longer working.    I tried calling her IHSS Social Worker because the form states that it is due on 01/30/20 but patient does not have an appointment with me until 02/01/20.    Will route to [facility], please try to reach patient to get a working phone number for telemedicine appointment with me 02/01/20 or convert her to Open Access. Thank you.      Pricilla Loveless, MD   [facility]   Pager: [phone]

## 2020-01-24 ENCOUNTER — Telehealth: Payer: Self-pay | Admitting: Family Medicine

## 2020-01-24 NOTE — Telephone Encounter (Signed)
Regina Ward from [facility] IHSS returning phone call from doctor to advise due date for forms has been extended 10 more days patient does have an updated phone number where she can be reached it is [phone] thank you

## 2020-01-24 NOTE — Telephone Encounter (Signed)
Message forward to Dr. Truong

## 2020-01-26 NOTE — Telephone Encounter (Signed)
01/26/20 Phone attempt to schedule pt for covid vaccine, no answer, message left, pt can also log onto [email or web address] for appointment.

## 2020-01-28 NOTE — Telephone Encounter (Signed)
Spoke to pt, has been scheduled 01/29/20 for covid vaccine

## 2020-01-29 ENCOUNTER — Ambulatory Visit: Payer: No Typology Code available for payment source
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
## 2020-01-30 NOTE — Telephone Encounter (Signed)
I have tried calling the [phone] multiple times and it does not work either. I will probably have to wait until our appointment together on 02/01/20 to complete the form.    Pricilla Loveless, MD   [facility]   Pager: [phone]

## 2020-02-01 ENCOUNTER — Ambulatory Visit: Payer: No Typology Code available for payment source | Admitting: Family Medicine

## 2020-02-01 ENCOUNTER — Ambulatory Visit: Payer: No Typology Code available for payment source | Attending: Family Practice | Admitting: Family Medicine

## 2020-02-01 ENCOUNTER — Encounter: Payer: Self-pay | Admitting: Family Medicine

## 2020-02-01 VITALS — BP 150/79 | HR 66 | Temp 97.6°F | Resp 14 | Ht 62.0 in | Wt 172.0 lb

## 2020-02-01 DIAGNOSIS — Z0289 Encounter for other administrative examinations: Secondary | ICD-10-CM

## 2020-02-01 DIAGNOSIS — G2581 Restless legs syndrome: Secondary | ICD-10-CM

## 2020-02-01 DIAGNOSIS — K5901 Slow transit constipation: Secondary | ICD-10-CM

## 2020-02-01 DIAGNOSIS — J449 Chronic obstructive pulmonary disease, unspecified: Secondary | ICD-10-CM | POA: Insufficient documentation

## 2020-02-01 DIAGNOSIS — R400 Somnolence: Secondary | ICD-10-CM | POA: Insufficient documentation

## 2020-02-01 DIAGNOSIS — Z6831 Body mass index (BMI) 31.0-31.9, adult: Secondary | ICD-10-CM | POA: Insufficient documentation

## 2020-02-01 DIAGNOSIS — Z7182 Exercise counseling: Secondary | ICD-10-CM

## 2020-02-01 DIAGNOSIS — E6609 Other obesity due to excess calories: Secondary | ICD-10-CM

## 2020-02-01 DIAGNOSIS — Z713 Dietary counseling and surveillance: Secondary | ICD-10-CM | POA: Insufficient documentation

## 2020-02-01 DIAGNOSIS — E1142 Type 2 diabetes mellitus with diabetic polyneuropathy: Secondary | ICD-10-CM | POA: Insufficient documentation

## 2020-02-01 LAB — HEMOGLOBIN A1C, POINT OF CARE TESTING: Hemoglobin A1c, POC: 6 % — ABNORMAL HIGH (ref 4.0–5.6)

## 2020-02-01 MED ORDER — GABAPENTIN 300 MG OR CAPS
300.0000 mg | ORAL_CAPSULE | Freq: Every evening | ORAL | 1 refills | Status: DC
Start: 2020-02-01 — End: 2020-02-27

## 2020-02-01 MED ORDER — BISACODYL EC 5 MG OR TBEC
5.0000 mg | DELAYED_RELEASE_TABLET | Freq: Two times a day (BID) | ORAL | 3 refills | Status: DC | PRN
Start: 2020-02-01 — End: 2020-02-27

## 2020-02-01 MED ORDER — ROPINIROLE HCL 4 MG OR TABS
4.0000 mg | ORAL_TABLET | Freq: Every day | ORAL | 0 refills | Status: DC
Start: 2020-02-01 — End: 2020-03-20

## 2020-02-01 NOTE — Progress Notes (Signed)
ATTENDING ATTESTATION:    I discussed the case with the resident and agree with the findings and plan as documented by the resident. My additions or revisions are included in the record and the plan of care was documented in this note. I reviewed and signed the resident's note after discussion and additional considerations that now reflect my findings and treatment plan.    Rickey Sadowski, MD

## 2020-02-01 NOTE — Patient Instructions (Addendum)
Set up your patient portal to access your test results, request appointments/prescriptions: [email or web address]    TIPS on how to live a longer, healthier life.   1. Drink 2 Liters (about 8 cups) of water a day. Drink 1 cup of water before each meal. If you hate water, drink green tea. Avoid sugary drink and alcohol. Limit to 1 glass of wine a day.    2. Limit salt intake to 2 grams a day, season food with herbs and spices, like garlic, cinnamon, curry, and ginger.   3. Eat 5-10 servings of fruits and vegetables a day. Eat fruits after each meals, instead of dessert.   4. Eat healthy fats, like olive oil, avocado, dark chocolate. Eat a handful of nuts (almond, walnut, cashews, peanuts) daily or legumes (hummus or lentil soup).   5. Eat fish, eggs, lean meat like chicken. Limit red meat like beef and pork to only special occasions.   6. Eat health dairy like greek yogurt, avoid cheese. Avoid sugary dessert.   7. AVOID processed foods and simple, refined carbs like white bread, flour tortilla, white rice, flour pasta, instead eat whole-wheat bread, brown rice, wheat tortilla.   8. Think positive, do not dwell on the negatives. Get 7-9 hours of sleep daily. Exercise 30-60 minutes of cardiovascular activity a day. If your joints hurt, do low-impact exercises like walking, swimming, station bikes, and elliptical. If your joints are healthy, you can do running, stair-master, play sports, and do weight-training.   9. Everything in moderation. Portion control. Eat only till you are 3/4 full. Your brain takes 30 minutes to get signals from your stomach that you are full. If you are still hungry after a meal, eat some fruits/nuts.

## 2020-02-08 ENCOUNTER — Telehealth: Payer: Self-pay | Admitting: Family Medicine

## 2020-02-08 NOTE — Telephone Encounter (Signed)
PT HAS NOT RECEIVE HER MEDICATION YET  FROM LAST APPT PLEASE CALL PT TO ASSIST.

## 2020-02-08 NOTE — Telephone Encounter (Signed)
Medication sent to pharmacy on 4/30 patient needs to call pharmacy to see if is ready for pick up.

## 2020-02-19 ENCOUNTER — Telehealth: Payer: Self-pay | Admitting: Family Medicine

## 2020-02-19 NOTE — Telephone Encounter (Addendum)
Per Madaline Guthrie from [facility] they are not able to contact patient for the sleep study. Phone numbers verified and he does have the same ones.

## 2020-02-20 NOTE — Telephone Encounter (Signed)
Called pt to informed her that Madaline Guthrie is trying to get a hold of her to schedule appt-n/a LVM to call back

## 2020-02-27 ENCOUNTER — Ambulatory Visit: Payer: No Typology Code available for payment source | Attending: General Practice | Admitting: General Practice

## 2020-02-27 ENCOUNTER — Ambulatory Visit: Payer: No Typology Code available for payment source | Attending: Dentist

## 2020-02-27 VITALS — BP 140/60 | HR 81 | Temp 98.2°F | Resp 16 | Ht 62.0 in | Wt 179.9 lb

## 2020-02-27 DIAGNOSIS — G2581 Restless legs syndrome: Secondary | ICD-10-CM | POA: Insufficient documentation

## 2020-02-27 DIAGNOSIS — Z23 Encounter for immunization: Secondary | ICD-10-CM | POA: Insufficient documentation

## 2020-02-27 DIAGNOSIS — Z6832 Body mass index (BMI) 32.0-32.9, adult: Secondary | ICD-10-CM | POA: Insufficient documentation

## 2020-02-27 DIAGNOSIS — E1142 Type 2 diabetes mellitus with diabetic polyneuropathy: Secondary | ICD-10-CM | POA: Insufficient documentation

## 2020-02-27 DIAGNOSIS — Z760 Encounter for issue of repeat prescription: Secondary | ICD-10-CM | POA: Insufficient documentation

## 2020-02-27 DIAGNOSIS — I10 Essential (primary) hypertension: Secondary | ICD-10-CM | POA: Insufficient documentation

## 2020-02-27 DIAGNOSIS — K5901 Slow transit constipation: Secondary | ICD-10-CM

## 2020-02-27 MED ORDER — LANCETS MISC
1.0000 | Freq: Every evening | 5 refills | Status: AC
Start: 2020-02-27 — End: ?

## 2020-02-27 MED ORDER — GLUCOSE BLOOD VI STRP
1.0000 | ORAL_STRIP | Freq: Every evening | 5 refills | Status: AC
Start: 2020-02-27 — End: ?

## 2020-02-27 MED ORDER — TRAZODONE HCL 100 MG OR TABS
ORAL_TABLET | ORAL | Status: DC
Start: 2020-01-31 — End: 2020-03-20

## 2020-02-27 MED ORDER — GABAPENTIN 300 MG OR CAPS
300.0000 mg | ORAL_CAPSULE | Freq: Every evening | ORAL | 1 refills | Status: DC
Start: 2020-02-27 — End: 2020-03-20

## 2020-02-27 MED ORDER — LISINOPRIL 40 MG OR TABS
40.0000 mg | ORAL_TABLET | Freq: Every day | ORAL | 0 refills | Status: DC
Start: 2020-02-27 — End: 2020-03-20

## 2020-02-27 MED ORDER — BISACODYL EC 5 MG OR TBEC
5.0000 mg | DELAYED_RELEASE_TABLET | Freq: Two times a day (BID) | ORAL | 3 refills | Status: AC | PRN
Start: 2020-02-27 — End: ?

## 2020-02-27 MED ORDER — METOPROLOL SUCCINATE 25 MG OR TB24
ORAL_TABLET | ORAL | Status: DC
Start: 2020-02-18 — End: 2020-03-20

## 2020-02-27 NOTE — Patient Instructions (Signed)
It is very important that you bring your medications to your next appointment with your regular doctor so that they can review how much you have of each medication and which medications require long term refills.        Bleeding Precautions When on Anticoagulant Therapy, Adult  Anticoagulant therapy, also called blood thinner therapy, is medicine that helps to prevent and treat blood clots. The medicine works by stopping blood clots from forming or growing. Blood clots that form in your blood vessels can be dangerous. They can break loose and travel to the heart, lungs, or brain. This increases the risk of a heart attack, stroke, or blocked lung artery (pulmonary embolism).  Anticoagulants also increase the risk of bleeding. Try to protect yourself from cuts and other injuries that can cause bleeding. It is important to take anticoagulants exactly as told by your health care provider.  Why do I need to be on anticoagulant therapy?  You may need this medicine if you are at risk of developing a blood clot. Conditions that increase your risk of a blood clot include:   Being born with heart disease or a heart malformation (congenital heart disease).   Developing heart disease.   Having had surgery, such as valve replacement.   Having had a serious accident or other type of severe injury (trauma).   Having certain types of cancer.   Having certain diseases that can increase blood clotting.   Having a high risk of stroke or heart attack.   Having atrial fibrillation (AF).  What are the common anticoagulant medicines?  There are several types of anticoagulant medicines. The most common types are:   Medicines that you take by mouth (oral medicines), such as:  ? Warfarin.  ? Novel oral anticoagulants (NOACs), such as:  ? Direct thrombin inhibitors (dabigatran).  ? Factor Xa inhibitors (apixaban, edoxaban, and rivaroxaban).   Injections, such as:  ? Unfractionated heparin.  ? Low molecular weight heparin.  These  anticoagulants work in different ways to prevent blood clots. They also have different risks and side effects.  What do I need to remember while on anticoagulant therapy?  Taking anticoagulants   Take your medicine at the same time every day. If you forget to take your medicine, take it as soon as you remember. Do not double your dosage of medicine if you miss a whole day. Take your normal dose and call your health care provider.   Do not stop taking your medicine unless your health care provider approves. Stopping the medicine can increase your risk of developing a blood clot.  Taking other medicines   Take over-the-counter and prescriptions medicines only as told by your health care provider.   Do not take over-the-counter NSAIDs, including aspirin and ibuprofen, while you are on anticoagulant therapy. These medicines increase your risk of dangerous bleeding.   Get approval from your health care provider before you start taking any new medicines, vitamins, or herbal products. Some of these could interfere with your therapy.  General instructions   Keep all follow-up visits as told by your health care provider. This is important.   If you are pregnant or trying to get pregnant, talk with a health care provider about anticoagulants. Some of these medicines are not safe to take during pregnancy.   Tell all health care providers, including your dentist, that you are on anticoagulant therapy. It is especially important to tell providers before you have any surgery, medical procedures, or dental work done.  What precautions should I take?     Be very careful when using knives, scissors, or other sharp objects.   Use an electric razor instead of a blade.   Do not use toothpicks.   Use a soft-bristled toothbrush. Brush your teeth gently.   Always wear shoes outdoors and wear slippers indoors.   Be careful when cutting your fingernails and toenails.   Place bath mats in the bathroom. If possible, install  handrails as well.   Wear gloves while you do yard work.   Wear your seat belt.   Prevent falls by removing loose rugs and extension cords from areas where you walk. Use a cane or walker if you need it.   Avoid constipation by:  ? Drinking enough fluid to keep your urine clear or pale yellow.  ? Eating foods that are high in fiber, such as fresh fruits and vegetables, whole grains, and beans.  ? Limiting foods that are high in fat and processed sugars, such as fried and sweet foods.   Do not play contact sports or participate in other activities that have a high risk for injury.  What other precautions are important if on warfarin therapy?  If you are taking a type of anticoagulant called warfarin, make sure you:   Work with a diet and nutrition specialist (dietitian) to make an eating plan. Do not make any sudden changes to your diet after you have started your eating plan.   Do not drink alcohol. It can interfere with your medicine and increase your risk of an injury that causes bleeding.   Get regular blood tests as told by your health care provider.  What are some questions to ask my health care provider?   Why do I need anticoagulant therapy?   What is the best anticoagulant therapy for my condition?   How long will I need anticoagulant therapy?   What are the side effects of anticoagulant therapy?   When should I take my medicine? What should I do if I forget to take it?   Will I need to have regular blood tests?   Do I need to change my diet? Are there foods or drinks that I should avoid?   What activities are safe for me?   What should I do if I want to get pregnant?  Contact a health care provider if:   You miss a dose of medicine:  ? And you are not sure what to do.  ? For more than one day.   You have:  ? Menstrual bleeding that is heavier than normal.  ? Bloody or brown urine.  ? Easy bruising.  ? Black and tarry stool or bright red stool.  ? Side effects from your medicine.   You  feel weak or dizzy.   You become pregnant.  Get help right away if:   You have bleeding that will not stop within 20 minutes from:  ? The nose.  ? The gums.  ? A cut on the skin.   You have a severe headache or stomachache.   You vomit or cough up blood.   You fall or hit your head.  Summary   Anticoagulant therapy, also called blood thinner therapy, is medicine that helps to prevent and treat blood clots.   Anticoagulants work in different ways to prevent blood clots. They also have different risks and side effects.   Talk with your health care provider about any precautions that you should take while  on anticoagulant therapy.  This information is not intended to replace advice given to you by your health care provider. Make sure you discuss any questions you have with your health care provider.  Document Released: 09/01/2015 Document Revised: 12/07/2016 Document Reviewed: 12/07/2016  Elsevier Interactive Patient Education  2019 [employer]      ______________________________________________________________________    THANK YOU FOR ALLOWING [place] TO PARTICIPATE IN YOUR CARE TODAY    GRACIAS POR PERMITIRNOS ATENDERLA [facility] [place]    ~~~~~~~~~~~~~~~~~~~~~~~~~~~~~~~~~~~~~~~~~~~~~~~~~~~~~~~~~~~~~~  [facility]:   IF YOU HAVE ANY ISSUES OR QUESTIONS THAT CANNOT WAIT UNTIL THE NEXT BUSINESS DAY --  PLEASE CALL [phone]     SI TIENE ALGUNA PREGUNTA O PROBLEMA QUE NO PUEDA ESPERAR HASTA [place] --  [place] [phone]    When should you call for help?  Call 911 anytime you think you may need emergency care.    Llame al 911 si tiene una emergencia

## 2020-02-27 NOTE — Progress Notes (Signed)
HPI: Regina Ward is a 59 year old female presenting for refill request.    -Here for med refills. Out of lisinopril a week. Out lancets and test strips for a month. Needs refill for bisacodyl and gabapentin.    -RX for Abilify is from psychiatry and will call him for refill.    -Has enough metformin, metoprolol. Has enough of other meds (cannot name all of the meds she takes, but states she is not out of any of the other medications she regularly takes).     -Nitroglyciern last used 3-4 months ago. Follows with cardiologist, had heart monitor test last week.     -Has been getting bruising to L inner thigh.     Past Medical History  Patient Active Problem List   Diagnosis   . Insomnia due to other mental disorder   . Bilateral carotid artery stenosis s/p R thromboendarterectomy (12/2016)   . COPD with asthma (CMS-HCC)   . Schizoaffective disorder, depressive type (CMS-HCC)   . Restless leg syndrome   . Posttraumatic stress disorder   . Type 2 diabetes mellitus with diabetic polyneuropathy, without long-term current use of insulin (CMS-HCC)   . Tobacco use disorder   . Chronic hepatitis C without hepatic coma (CMS-HCC)   . Essential hypertension   . 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 Intractable migraine without aura and without status migrainosus   . History of heart artery stent - 07/2019   . Unintentional weight loss   . Fracture of left pinky, subsequent encounter for fracture   . Daytime somnolence       Surgical History  Past Surgical History:   Procedure Laterality Date   . THROMBOENDARTERECTOMY Right 12/2016    Carotd endarterectomy   . TOTAL ABDOMINAL HYSTERECTOMY W/ BILATERAL SALPINGOOPHORECTOMY  1993       Family History  Family History   Problem Relation Name Age of Onset   . Diabetes Mother          Leg amputation   . Stroke Father  6   . Heart Disease Brother     . Stroke Maternal Grandmother     . Heart Attack Maternal Grandmother         Social  History  Social History     Occupational History   . Not on file   Tobacco Use   . Smoking status: Current Every Day Smoker     Packs/day: 1.50     Years: 41.00     Pack years: 61.50     Types: Cigarettes     Start date: 15   . Smokeless tobacco: Never Used   Substance and Sexual Activity   . Alcohol use: No   . Drug use: No     Comment: ex drug use 2016 - cocaine   . Sexual activity: Never     Comment: Not sexually active in 3 years       Medications    Current Outpatient Medications:   .  albuterol 108 (90 Base) MCG/ACT inhaler, Inhale 2 puffs by mouth every 6 hours as needed for Wheezing., Disp: 1 Inhaler, Rfl: 3  .  amLODIPINE (NORVASC) 10 MG tablet, Take 1 tablet (10 mg) by mouth daily., Disp: 30 tablet, Rfl: 0  .  ARIPiprazole (ABILIFY) 5 MG tablet, Take 5 mg by mouth daily., Disp: , Rfl:   .  aspirin 81 MG EC tablet, Take 1 tablet (81 mg)  by mouth daily., Disp: 30 tablet, Rfl: 0  .  atorvastatin (LIPITOR) 80 MG tablet, Take 1 tablet (80 mg) by mouth nightly., Disp: 30 tablet, Rfl: 0  .  bisacodyl (DULCOLAX) 5 MG Enteric-Coated tablet, Take 1 tablet (5 mg) by mouth 2 times daily as needed for Constipation., Disp: 60 tablet, Rfl: 3  .  Blood Glucose Monitoring Suppl (TRUE METRIX METER) w/Device KIT, Check blood sugar daily as directed., Disp: 1 kit, Rfl: 0  .  gabapentin (NEURONTIN) 300 MG capsule, Take 1 capsule (300 mg) by mouth at bedtime., Disp: 90 capsule, Rfl: 1  .  glucose blood test strip, 1 strip by Other route at bedtime., Disp: 100 strip, Rfl: 5  .  lancets, 1 each by Other route at bedtime., Disp: 100 Lancet, Rfl: 5  .  lisinopril (PRINIVIL, ZESTRIL) 40 MG tablet, Take 1 tablet (40 mg) by mouth daily., Disp: 30 tablet, Rfl: 0  .  metFORMIN (GLUCOPHAGE) 500 MG tablet, Take 1 tablet (500 mg) by mouth 2 times daily (with meals)., Disp: 60 tablet, Rfl: 0  .  metoprolol succinate (TOPROL XL) 25 MG XL tablet, , Disp: , Rfl:   .  Metoprolol Succinate 25 MG CS24, Take 25 mg by mouth daily., Disp: 30  capsule, Rfl: 0  .  nitroGLYcerin (NITROSTAT) 0.4 MG SL tablet, 1 tablet (0.4 mg) by Sublingual route every 5 minutes as needed for Chest Pain. up to 3 tabs per episode., Disp: 1 bottle, Rfl: 1  .  ropinirole (REQUIP) 4 MG tablet, Take 1 tablet (4 mg) by mouth daily., Disp: 90 tablet, Rfl: 0  .  sertraline (ZOLOFT) 25 MG tablet, TAKE 1 TABLET BY MOUTH EVERY DAY IN THE MORNING, Disp: , Rfl:   .  ticagrelor (BRILINTA) 90 MG TABS tablet, Take 1 tablet (90 mg) by mouth every 12 hours., Disp: 60 tablet, Rfl: 0  .  traZODone (DESYREL) 100 MG tablet, , Disp: , Rfl:   .  traZODone (DESYREL) 50 MG tablet, , Disp: , Rfl:     Allergies  No Known Allergies    ROS  As per HPI above.    Vitals  Vitals:    02/27/20 1322 02/27/20 1328   BP: 158/52 140/60   BP Location: Left arm Left arm   BP Patient Position: Sitting Sitting   BP cuff size: Regular Regular   Pulse: 78 81   Resp: 16    Temp: 98.2 F (36.8 C)    TempSrc: Temporal    SpO2: 100%    Weight: 81.6 kg (179 lb 14.3 oz)    Height: '5\' 2"'$  (1.575 m)    Body mass index is 32.9 kg/m.    Physical Exam  General:  No acute distress  Lungs:  Clear to ascultation bilaterally   Heart: Regular rate and rhythm, faint systolic murmur  Extremities: no lower extremity edema    Labs  Lab Results   Component Value Date    A1C 6.0 (H) 02/01/2020    A1C 5.3 07/11/2019    A1C 6.1 (H) 03/19/2019     Lab Results   Component Value Date    CREAT 0.9 07/11/2019     Lab Results   Component Value Date    K 4.9 07/11/2019    CL 107 07/11/2019    BUN 16 07/11/2019    CREAT 0.9 07/11/2019    GLU 103 07/11/2019    [place] 10.0 07/11/2019       Assessment:  1. Encounter for  medication refill  -It is very important that you bring your medications to your next appointment with your regular doctor so that they can review how much you have of each medication and which medications require long term refills.    2. Essential hypertension  -Last BMP 07/2019  - lisinopril (PRINIVIL, ZESTRIL) 40 MG tablet; Take 1 tablet  (40 mg) by mouth daily.  Dispense: 30 tablet; Refill: 0    3. Restless leg syndrome  - gabapentin (NEURONTIN) 300 MG capsule; Take 1 capsule (300 mg) by mouth at bedtime.  Dispense: 90 capsule; Refill: 1    4. Type 2 diabetes mellitus with diabetic polyneuropathy, without long-term current use of insulin (CMS-HCC)  - lancets; 1 each by Other route at bedtime.  Dispense: 100 Lancet; Refill: 5  - glucose blood test strip; 1 strip by Other route at bedtime.  Dispense: 100 strip; Refill: 5    5. Slow transit constipation  - bisacodyl (DULCOLAX) 5 MG Enteric-Coated tablet; Take 1 tablet (5 mg) by mouth 2 times daily as needed for Constipation.  Dispense: 60 tablet; Refill: 3    Patient was given strict return precautions should symptoms worsen or fail to improve for any concerns.    Electronically signed by:    Gildardo Griffes, NP  [facility]

## 2020-03-01 ENCOUNTER — Ambulatory Visit: Payer: No Typology Code available for payment source

## 2020-03-19 ENCOUNTER — Other Ambulatory Visit: Payer: Self-pay | Admitting: General Practice

## 2020-03-19 ENCOUNTER — Ambulatory Visit: Payer: No Typology Code available for payment source | Attending: Dentist

## 2020-03-19 ENCOUNTER — Encounter: Payer: Self-pay | Admitting: Family Medicine

## 2020-03-19 DIAGNOSIS — I10 Essential (primary) hypertension: Secondary | ICD-10-CM

## 2020-03-19 DIAGNOSIS — Z23 Encounter for immunization: Secondary | ICD-10-CM
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
## 2020-03-19 NOTE — Telephone Encounter (Signed)
Escript refill request for lisinopril received and declined. Has appt with PCP tomorrow and needs to request refill at that appt. Note sent to pharmacy.

## 2020-03-20 ENCOUNTER — Encounter: Payer: Self-pay | Admitting: Family Medicine

## 2020-03-20 ENCOUNTER — Ambulatory Visit: Payer: No Typology Code available for payment source | Attending: Family Medicine | Admitting: Family Medicine

## 2020-03-20 VITALS — BP 135/63 | HR 52 | Temp 97.0°F | Resp 12 | Ht 62.0 in | Wt 181.9 lb

## 2020-03-20 DIAGNOSIS — G2581 Restless legs syndrome: Secondary | ICD-10-CM | POA: Insufficient documentation

## 2020-03-20 DIAGNOSIS — F251 Schizoaffective disorder, depressive type: Secondary | ICD-10-CM | POA: Insufficient documentation

## 2020-03-20 DIAGNOSIS — Z6833 Body mass index (BMI) 33.0-33.9, adult: Secondary | ICD-10-CM | POA: Insufficient documentation

## 2020-03-20 DIAGNOSIS — M25561 Pain in right knee: Secondary | ICD-10-CM | POA: Insufficient documentation

## 2020-03-20 DIAGNOSIS — J449 Chronic obstructive pulmonary disease, unspecified: Secondary | ICD-10-CM | POA: Insufficient documentation

## 2020-03-20 DIAGNOSIS — F99 Mental disorder, not otherwise specified: Secondary | ICD-10-CM

## 2020-03-20 DIAGNOSIS — F4310 Post-traumatic stress disorder, unspecified: Secondary | ICD-10-CM | POA: Insufficient documentation

## 2020-03-20 DIAGNOSIS — I10 Essential (primary) hypertension: Secondary | ICD-10-CM

## 2020-03-20 DIAGNOSIS — Z713 Dietary counseling and surveillance: Secondary | ICD-10-CM | POA: Insufficient documentation

## 2020-03-20 DIAGNOSIS — I63233 Cerebral infarction due to unspecified occlusion or stenosis of bilateral carotid arteries: Secondary | ICD-10-CM | POA: Insufficient documentation

## 2020-03-20 DIAGNOSIS — Z7182 Exercise counseling: Secondary | ICD-10-CM

## 2020-03-20 DIAGNOSIS — F5105 Insomnia due to other mental disorder: Secondary | ICD-10-CM | POA: Insufficient documentation

## 2020-03-20 DIAGNOSIS — E6609 Other obesity due to excess calories: Secondary | ICD-10-CM | POA: Insufficient documentation

## 2020-03-20 DIAGNOSIS — R040 Epistaxis: Secondary | ICD-10-CM | POA: Insufficient documentation

## 2020-03-20 MED ORDER — TRAZODONE HCL 100 MG OR TABS
100.0000 mg | ORAL_TABLET | Freq: Every evening | ORAL | Status: DC
Start: ? — End: 2020-03-20

## 2020-03-20 MED ORDER — METOPROLOL SUCCINATE 25 MG PO CS24
25.0000 mg | EXTENDED_RELEASE_CAPSULE | Freq: Every day | ORAL | 0 refills | Status: AC
Start: 2020-03-20 — End: ?

## 2020-03-20 MED ORDER — GABAPENTIN 300 MG OR CAPS
ORAL_CAPSULE | ORAL | 1 refills | Status: AC
Start: 2020-03-20 — End: ?

## 2020-03-20 MED ORDER — LISINOPRIL 40 MG OR TABS
40.0000 mg | ORAL_TABLET | Freq: Every day | ORAL | 0 refills | Status: AC
Start: 2020-03-20 — End: ?

## 2020-03-20 MED ORDER — ARIPIPRAZOLE 5 MG OR TABS
5.0000 mg | ORAL_TABLET | Freq: Every day | ORAL | 0 refills | Status: AC
Start: 2020-03-20 — End: ?

## 2020-03-20 MED ORDER — DICLOFENAC SODIUM 1 % EX GEL
2.0000 g | Freq: Four times a day (QID) | CUTANEOUS | 1 refills | Status: AC
Start: 2020-03-20 — End: ?

## 2020-03-20 MED ORDER — ALBUTEROL SULFATE 108 (90 BASE) MCG/ACT IN AERS
2.0000 | INHALATION_SPRAY | Freq: Four times a day (QID) | RESPIRATORY_TRACT | 1 refills | Status: AC | PRN
Start: 2020-03-20 — End: ?

## 2020-03-20 MED ORDER — ATORVASTATIN CALCIUM 80 MG OR TABS
80.0000 mg | ORAL_TABLET | Freq: Every evening | ORAL | 0 refills | Status: AC
Start: 2020-03-20 — End: ?

## 2020-03-20 MED ORDER — ROPINIROLE HCL 4 MG OR TABS
4.0000 mg | ORAL_TABLET | Freq: Every evening | ORAL | 0 refills | Status: AC
Start: 2020-03-20 — End: ?

## 2020-03-20 MED ORDER — AMLODIPINE 10 MG OR TABS
10.0000 mg | ORAL_TABLET | Freq: Every day | ORAL | 0 refills | Status: AC
Start: 2020-03-20 — End: ?

## 2020-03-20 MED ORDER — AZELASTINE HCL 137 MCG/SPRAY NA SOLN
1.0000 | Freq: Two times a day (BID) | NASAL | 1 refills | Status: AC
Start: 2020-03-20 — End: ?

## 2020-03-20 MED ORDER — SERTRALINE HCL 25 MG OR TABS
25.0000 mg | ORAL_TABLET | Freq: Every day | ORAL | 0 refills | Status: AC
Start: 2020-03-20 — End: ?

## 2020-03-20 MED ORDER — TRAZODONE HCL 100 MG OR TABS
100.0000 mg | ORAL_TABLET | Freq: Every evening | ORAL | 0 refills | Status: AC
Start: 2020-03-20 — End: ?

## 2020-03-20 MED ORDER — LIDOCAINE HCL 4 % EX CREA
TOPICAL_CREAM | CUTANEOUS | 0 refills | Status: AC
Start: 2020-03-20 — End: ?

## 2020-03-20 NOTE — Progress Notes (Signed)
===================================================================    Attending Attestation: I reviewed the key and critical portions of the history and physical as presented by the resident/fellow and agree with the medical decision making and the assessment and plan as documented. My additions or revision are included in the record.    59 year old female MMP bilateral recurrent epistaxis worse the past 3 weeks prompted ED visit refractive to Vaseline and humidifier and nasal saline. Says flonase and afrin is too expensive and not help as much.   Exam per resident polyp on left nares.   Trial asteline to see if covered by insurance and refer to ENT    Marguerita Beards, MD  Family Medicine

## 2020-03-21 NOTE — Progress Notes (Signed)
Patient was evaluated by senior resident per [facility] guidelines.   Renn Dirocco, MD

## 2020-04-18 ENCOUNTER — Ambulatory Visit: Payer: No Typology Code available for payment source | Admitting: Family Practice

## 2020-05-19 ENCOUNTER — Ambulatory Visit: Payer: No Typology Code available for payment source | Admitting: Family Practice

## 2020-06-04 DEATH — deceased

## 2020-06-19 ENCOUNTER — Ambulatory Visit: Payer: No Typology Code available for payment source | Admitting: Family Practice

## 2021-03-13 ENCOUNTER — Encounter: Payer: Self-pay | Admitting: Family Medicine

## 2021-03-13 DIAGNOSIS — E1142 Type 2 diabetes mellitus with diabetic polyneuropathy: Secondary | ICD-10-CM
